# Patient Record
Sex: Male | Born: 1965 | Race: White | Hispanic: No | Marital: Married | Smoking: Never smoker
Health system: Southern US, Community
[De-identification: ages and names within clinical notes are randomized; demographics above are authoritative.]

## PROBLEM LIST (undated history)

## (undated) DIAGNOSIS — H539 Unspecified visual disturbance: Secondary | ICD-10-CM

## (undated) DIAGNOSIS — F32A Depression, unspecified: Secondary | ICD-10-CM

## (undated) DIAGNOSIS — E291 Testicular hypofunction: Secondary | ICD-10-CM

## (undated) DIAGNOSIS — M545 Low back pain, unspecified: Secondary | ICD-10-CM

## (undated) DIAGNOSIS — K760 Fatty (change of) liver, not elsewhere classified: Secondary | ICD-10-CM

## (undated) DIAGNOSIS — E7800 Pure hypercholesterolemia, unspecified: Secondary | ICD-10-CM

## (undated) DIAGNOSIS — R51 Headache: Secondary | ICD-10-CM

## (undated) DIAGNOSIS — I10 Essential (primary) hypertension: Secondary | ICD-10-CM

## (undated) DIAGNOSIS — R519 Headache, unspecified: Secondary | ICD-10-CM

## (undated) DIAGNOSIS — K589 Irritable bowel syndrome without diarrhea: Secondary | ICD-10-CM

## (undated) DIAGNOSIS — F329 Major depressive disorder, single episode, unspecified: Secondary | ICD-10-CM

## (undated) DIAGNOSIS — Z8701 Personal history of pneumonia (recurrent): Secondary | ICD-10-CM

## (undated) DIAGNOSIS — F419 Anxiety disorder, unspecified: Secondary | ICD-10-CM

## (undated) DIAGNOSIS — K5732 Diverticulitis of large intestine without perforation or abscess without bleeding: Principal | ICD-10-CM

## (undated) DIAGNOSIS — K5730 Diverticulosis of large intestine without perforation or abscess without bleeding: Secondary | ICD-10-CM

## (undated) DIAGNOSIS — E663 Overweight: Secondary | ICD-10-CM

## (undated) DIAGNOSIS — G894 Chronic pain syndrome: Secondary | ICD-10-CM

## (undated) DIAGNOSIS — B019 Varicella without complication: Secondary | ICD-10-CM

## (undated) HISTORY — DX: Diverticulosis of large intestine without perforation or abscess without bleeding: K57.30

## (undated) HISTORY — DX: Pure hypercholesterolemia, unspecified: E78.00

## (undated) HISTORY — DX: Chronic pain syndrome: G89.4

## (undated) HISTORY — DX: Essential (primary) hypertension: I10

## (undated) HISTORY — DX: Overweight: E66.3

## (undated) HISTORY — DX: Personal history of pneumonia (recurrent): Z87.01

## (undated) HISTORY — DX: Unspecified visual disturbance: H53.9

## (undated) HISTORY — DX: Fatty (change of) liver, not elsewhere classified: K76.0

## (undated) HISTORY — PX: WISDOM TOOTH EXTRACTION: SHX21

## (undated) HISTORY — DX: Varicella without complication: B01.9

## (undated) HISTORY — DX: Testicular hypofunction: E29.1

## (undated) HISTORY — DX: Headache: R51

## (undated) HISTORY — DX: Low back pain, unspecified: M54.50

## (undated) HISTORY — DX: Low back pain: M54.5

## (undated) HISTORY — DX: Headache, unspecified: R51.9

## (undated) HISTORY — DX: Irritable bowel syndrome, unspecified: K58.9

## (undated) HISTORY — DX: Major depressive disorder, single episode, unspecified: F32.9

## (undated) HISTORY — DX: Anxiety disorder, unspecified: F41.9

## (undated) HISTORY — DX: Depression, unspecified: F32.A

## (undated) HISTORY — DX: Diverticulitis of large intestine without perforation or abscess without bleeding: K57.32

---

## 2006-04-24 ENCOUNTER — Encounter: Admission: RE | Admit: 2006-04-24 | Discharge: 2006-04-24 | Payer: Self-pay | Admitting: Gastroenterology

## 2007-10-31 ENCOUNTER — Emergency Department (HOSPITAL_BASED_OUTPATIENT_CLINIC_OR_DEPARTMENT_OTHER): Admission: EM | Admit: 2007-10-31 | Discharge: 2007-10-31 | Payer: Self-pay | Admitting: Emergency Medicine

## 2009-02-26 ENCOUNTER — Emergency Department (HOSPITAL_BASED_OUTPATIENT_CLINIC_OR_DEPARTMENT_OTHER): Admission: EM | Admit: 2009-02-26 | Discharge: 2009-02-26 | Payer: Self-pay | Admitting: Emergency Medicine

## 2009-10-11 ENCOUNTER — Emergency Department (HOSPITAL_BASED_OUTPATIENT_CLINIC_OR_DEPARTMENT_OTHER): Admission: EM | Admit: 2009-10-11 | Discharge: 2009-10-11 | Payer: Self-pay | Admitting: Emergency Medicine

## 2009-10-11 ENCOUNTER — Ambulatory Visit: Payer: Self-pay | Admitting: Diagnostic Radiology

## 2009-10-13 ENCOUNTER — Emergency Department (HOSPITAL_BASED_OUTPATIENT_CLINIC_OR_DEPARTMENT_OTHER): Admission: EM | Admit: 2009-10-13 | Discharge: 2009-10-13 | Payer: Self-pay | Admitting: Emergency Medicine

## 2009-10-13 ENCOUNTER — Ambulatory Visit: Payer: Self-pay | Admitting: Diagnostic Radiology

## 2009-11-04 ENCOUNTER — Emergency Department (HOSPITAL_COMMUNITY): Admission: EM | Admit: 2009-11-04 | Discharge: 2009-11-05 | Payer: Self-pay | Admitting: Emergency Medicine

## 2010-01-01 HISTORY — PX: OTHER SURGICAL HISTORY: SHX169

## 2010-02-27 ENCOUNTER — Telehealth (INDEPENDENT_AMBULATORY_CARE_PROVIDER_SITE_OTHER): Payer: Self-pay | Admitting: *Deleted

## 2010-03-09 NOTE — Progress Notes (Signed)
Summary: [id number] x 1  Phone Note Call from Patient Call back at [phone]   Caller: Patient Call For: nadel Summary of Call: Pt has appt on 4/4 wants to know if he can be seen sooner than this pls advise. Initial call taken by: Darletta Moll,  February 27, 2010 3:36 PM  Follow-up for Phone Call        leigh pls advise   Philipp [facility]  February 27, 2010 4:46 PM   SN has no other aval times open for an earlier appt Randell Loop [facility]  February 27, 2010 4:58 PM    Called the other number that was given and msg states that this telephone number is not valid.  Had to [facility] at his home number. Vernie Murders  February 27, 2010 5:16 PM   Additional Follow-up for Phone Call Additional follow up Details #1::        Spoke with pt and advised no sooner openings with SN.  I asked if he was sick? Need to see TP? He stated that he is doing fine, just has not had a physical in a few yrs and is anxious.  I advised that he can call sooner and check and see if SN has ny cancellations. Additional Follow-up by: Vernie Murders,  February 28, 2010 5:30 PM

## 2010-03-14 LAB — CBC
HCT: 41.5 % (ref 39.0–52.0)
Hemoglobin: 14.1 g/dL (ref 13.0–17.0)
MCH: 30.4 pg (ref 26.0–34.0)
MCHC: 34 g/dL (ref 30.0–36.0)

## 2010-03-14 LAB — BASIC METABOLIC PANEL
CO2: 28 mEq/L (ref 19–32)
Chloride: 103 mEq/L (ref 96–112)
Glucose, Bld: 99 mg/dL (ref 70–99)
Potassium: 4.7 mEq/L (ref 3.5–5.1)
Sodium: 140 mEq/L (ref 135–145)

## 2010-03-14 LAB — DIFFERENTIAL
Basophils Relative: 0 % (ref 0–1)
Eosinophils Absolute: 0.2 10*3/uL (ref 0.0–0.7)
Monocytes Absolute: 0.6 10*3/uL (ref 0.1–1.0)
Monocytes Relative: 7 % (ref 3–12)

## 2010-03-14 LAB — D-DIMER, QUANTITATIVE: D-Dimer, Quant: <0.22 ug/mL-FEU (ref 0.00–0.48)

## 2010-03-14 LAB — CK TOTAL AND CKMB (NOT AT ARMC): CK, MB: 0.9 ng/mL (ref 0.3–4.0)

## 2010-03-16 LAB — CBC
Hemoglobin: 13.1 g/dL (ref 13.0–17.0)
MCH: 30.1 pg (ref 26.0–34.0)
RBC: 4.34 MIL/uL (ref 4.22–5.81)
WBC: 10.6 10*3/uL — ABNORMAL HIGH (ref 4.0–10.5)

## 2010-03-16 LAB — DIFFERENTIAL
Eosinophils Absolute: 0.3 10*3/uL (ref 0.0–0.7)
Lymphocytes Relative: 17 % (ref 12–46)
Lymphs Abs: 1.8 10*3/uL (ref 0.7–4.0)
Monocytes Relative: 7 % (ref 3–12)
Neutrophils Relative %: 72 % (ref 43–77)

## 2010-03-16 LAB — BASIC METABOLIC PANEL
CO2: 30 mEq/L (ref 19–32)
Calcium: 9.2 mg/dL (ref 8.4–10.5)
Chloride: 102 mEq/L (ref 96–112)
GFR calc Af Amer: >60 mL/min (ref >60)
Sodium: 141 mEq/L (ref 135–145)

## 2010-04-03 ENCOUNTER — Encounter: Payer: Self-pay | Admitting: *Deleted

## 2010-04-04 ENCOUNTER — Encounter: Payer: Self-pay | Admitting: Pulmonary Disease

## 2010-04-05 ENCOUNTER — Other Ambulatory Visit (INDEPENDENT_AMBULATORY_CARE_PROVIDER_SITE_OTHER): Payer: BC Managed Care – PPO | Admitting: Pulmonary Disease

## 2010-04-05 ENCOUNTER — Ambulatory Visit (INDEPENDENT_AMBULATORY_CARE_PROVIDER_SITE_OTHER): Payer: BC Managed Care – PPO | Admitting: Pulmonary Disease

## 2010-04-05 ENCOUNTER — Other Ambulatory Visit (INDEPENDENT_AMBULATORY_CARE_PROVIDER_SITE_OTHER): Payer: BC Managed Care – PPO

## 2010-04-05 ENCOUNTER — Ambulatory Visit (INDEPENDENT_AMBULATORY_CARE_PROVIDER_SITE_OTHER)
Admission: RE | Admit: 2010-04-05 | Discharge: 2010-04-05 | Disposition: A | Discharge status: Home / Self Care | Payer: BC Managed Care – PPO | Source: Ambulatory Visit | Attending: Pulmonary Disease | Admitting: Pulmonary Disease

## 2010-04-05 ENCOUNTER — Encounter: Payer: Self-pay | Admitting: Pulmonary Disease

## 2010-04-05 DIAGNOSIS — Z0000 Encounter for general adult medical examination without abnormal findings: Secondary | ICD-10-CM

## 2010-04-05 DIAGNOSIS — F32A Depression, unspecified: Secondary | ICD-10-CM

## 2010-04-05 DIAGNOSIS — K589 Irritable bowel syndrome without diarrhea: Secondary | ICD-10-CM

## 2010-04-05 DIAGNOSIS — R5381 Other malaise: Secondary | ICD-10-CM

## 2010-04-05 DIAGNOSIS — Z8701 Personal history of pneumonia (recurrent): Secondary | ICD-10-CM

## 2010-04-05 DIAGNOSIS — R0600 Dyspnea, unspecified: Secondary | ICD-10-CM

## 2010-04-05 DIAGNOSIS — F341 Dysthymic disorder: Secondary | ICD-10-CM

## 2010-04-05 DIAGNOSIS — M545 Low back pain, unspecified: Secondary | ICD-10-CM

## 2010-04-05 DIAGNOSIS — R5383 Other fatigue: Secondary | ICD-10-CM

## 2010-04-05 DIAGNOSIS — F419 Anxiety disorder, unspecified: Secondary | ICD-10-CM

## 2010-04-05 DIAGNOSIS — R531 Weakness: Secondary | ICD-10-CM

## 2010-04-05 DIAGNOSIS — E785 Hyperlipidemia, unspecified: Secondary | ICD-10-CM

## 2010-04-05 DIAGNOSIS — R0989 Other specified symptoms and signs involving the circulatory and respiratory systems: Secondary | ICD-10-CM

## 2010-04-05 DIAGNOSIS — R0609 Other forms of dyspnea: Secondary | ICD-10-CM

## 2010-04-05 LAB — TESTOSTERONE: Testosterone: 229.04 ng/dL — ABNORMAL LOW (ref 350.00–890.00)

## 2010-04-05 LAB — HEPATIC FUNCTION PANEL
ALT: 86 U/L — ABNORMAL HIGH (ref 0–53)
AST: 39 U/L — ABNORMAL HIGH (ref 0–37)
Bilirubin, Direct: 0.1 mg/dL (ref 0.0–0.3)
Total Bilirubin: 1.1 mg/dL (ref 0.3–1.2)

## 2010-04-05 LAB — LIPID PANEL
Cholesterol: 261 mg/dL — ABNORMAL HIGH (ref 0–200)
HDL: 43.7 mg/dL (ref >39.00)
Total CHOL/HDL Ratio: 6
Triglycerides: 243 mg/dL — ABNORMAL HIGH (ref 0.0–149.0)
VLDL: 48.6 mg/dL — ABNORMAL HIGH (ref 0.0–40.0)

## 2010-04-05 LAB — BASIC METABOLIC PANEL
BUN: 16 mg/dL (ref 6–23)
CO2: 29 mEq/L (ref 19–32)
Calcium: 9.4 mg/dL (ref 8.4–10.5)
Glucose, Bld: 85 mg/dL (ref 70–99)
Potassium: 4.6 mEq/L (ref 3.5–5.1)
Sodium: 143 mEq/L (ref 135–145)

## 2010-04-05 LAB — CBC WITH DIFFERENTIAL/PLATELET
Basophils Absolute: 0.1 10*3/uL (ref 0.0–0.1)
Eosinophils Relative: 0.6 % (ref 0.0–5.0)
Lymphocytes Relative: 19.3 % (ref 12.0–46.0)
Monocytes Relative: 7 % (ref 3.0–12.0)
Neutrophils Relative %: 72.7 % (ref 43.0–77.0)
Platelets: 461 10*3/uL — ABNORMAL HIGH (ref 150.0–400.0)
RDW: 14.8 % — ABNORMAL HIGH (ref 11.5–14.6)
WBC: 17.4 10*3/uL — ABNORMAL HIGH (ref 4.5–10.5)

## 2010-04-05 LAB — TSH: TSH: 0.99 u[IU]/mL (ref 0.35–5.50)
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
## 2010-04-05 NOTE — Patient Instructions (Addendum)
Kurt Mcdonald, it was nice meeting you today...    We established your med list w/ the 3 meds from DrSader> continue these the same for now...  Today we did an initial assessment including CXR, EKG, PFT, & fasting blood work...    We will call you w/ these results when they are all available & discuss any options for additional therapy at that time...  Be sure to start back on a gradual exercise program> walking, gym, at home, whatever...    Walking for the aerobic conditioning...    Back & Abdominal exercises for your spine...  At the same time start on a calorie restricted diet to aide in wt reduction...  Let's plan a follow up visit in 6-8 weeks to monitor your progress... We will attempt to get records from [facility] office in the interim.Marland KitchenMarland Kitchen

## 2010-04-11 ENCOUNTER — Encounter: Payer: Self-pay | Admitting: Pulmonary Disease

## 2010-04-11 DIAGNOSIS — M545 Low back pain, unspecified: Secondary | ICD-10-CM | POA: Insufficient documentation

## 2010-04-11 DIAGNOSIS — F329 Major depressive disorder, single episode, unspecified: Secondary | ICD-10-CM | POA: Insufficient documentation

## 2010-04-11 DIAGNOSIS — F32A Depression, unspecified: Secondary | ICD-10-CM | POA: Insufficient documentation

## 2010-04-11 DIAGNOSIS — Z8701 Personal history of pneumonia (recurrent): Secondary | ICD-10-CM | POA: Insufficient documentation

## 2010-04-11 NOTE — Assessment & Plan Note (Signed)
He was quite concerned about these two bouts of pneumonia around the time of his collar bone surgeries... We don't have records but eval today reveals totally normal CXR, PFT, and baseline EKG (x sl sinus tach)... He is reassured about his current cardiopulm status.Marland KitchenMarland Kitchen

## 2010-04-11 NOTE — Assessment & Plan Note (Signed)
Followed by DrSader in [place] on Cymbalta & Hydrocodone 10mg  Bid;  We will request records & review when avail;  Pt cautioned about the narcotic pain meds.Marland KitchenMarland Kitchen

## 2010-04-11 NOTE — Assessment & Plan Note (Signed)
He has classic IBS symptoms & we discussed this diagnosis Advised bulk agents, Metamucil etc... We will try to get records from DrMcFadder.Marland KitchenMarland Kitchen

## 2010-04-11 NOTE — Assessment & Plan Note (Signed)
I suspect mostly related to his stress & depression... Plus deconditioning & we reviewed diet to lose wt & exercise to incr his sense of well being etc... We requested full labs including Testos level to r/o Low-T, but he forgot to go to the lab & we will call him to come back at his convenience.Marland KitchenMarland Kitchen

## 2010-04-11 NOTE — Assessment & Plan Note (Signed)
He has signif problem w/ his nerves & depression due to family situation, business, etc... Already on Cymbalta 60mg /d & Klonopin from DrSader in [facility] (Neurology) & we will ask for records... Continue his meds for now.Marland KitchenMarland Kitchen

## 2010-04-17 ENCOUNTER — Other Ambulatory Visit: Payer: Self-pay | Admitting: Pulmonary Disease

## 2010-04-17 DIAGNOSIS — R7989 Other specified abnormal findings of blood chemistry: Secondary | ICD-10-CM

## 2010-04-19 ENCOUNTER — Ambulatory Visit
Admission: RE | Admit: 2010-04-19 | Discharge: 2010-04-19 | Disposition: A | Discharge status: Home / Self Care | Payer: BC Managed Care – PPO | Source: Ambulatory Visit | Attending: Pulmonary Disease | Admitting: Pulmonary Disease

## 2010-04-19 DIAGNOSIS — R7989 Other specified abnormal findings of blood chemistry: Secondary | ICD-10-CM

## 2010-04-21 ENCOUNTER — Telehealth: Payer: Self-pay | Admitting: Pulmonary Disease

## 2010-04-21 NOTE — Telephone Encounter (Signed)
Forwarded to Dr. Nadel for review. °

## 2010-05-04 ENCOUNTER — Telehealth: Payer: Self-pay | Admitting: Pulmonary Disease

## 2010-05-04 NOTE — Telephone Encounter (Signed)
LMTCB

## 2010-05-05 MED ORDER — TESTOSTERONE 12.5 MG/ACT (1%) TD GEL: 4.0000 "application " | TRANSDERMAL | Status: DC

## 2010-05-05 NOTE — Telephone Encounter (Signed)
[facility] and advised andro gel pump 4 pumps applied to skin every day as directed #1 month supply w/ prn refills. Pt is also aware rx was called into pharmacy. Spoke w/ Adela Glimpse at [facility] [place] and is aware of rx. Pt also aware of [place] of abd results and verbalized understanding his need to exercise and get his weight down

## 2010-05-05 NOTE — Telephone Encounter (Signed)
Per SN--good idea to start this--andro gel pump   4 pumps applied to skin every day as directed.   # 1 month supply with prn refills.--also please let pt know that his [place] of abd showed fatty liver disease and recs for this is exercise and get his weight down.  thanks

## 2010-05-05 NOTE — Telephone Encounter (Signed)
Spoke with pt.  He is c/o extreme fatigue and "trouble getting through the day"- feels this is due to low T. He states needs sooner appt with SN.  He states that he wants sooner appt with SN.  I advised SN has no openings for the rest of the month.  I offered appt with TP and he refused.  He states that he had discussed with SN at last visit starting on med for low T and would like to just go ahead and have something called in.  Will forward to SN.  Pls advise thanks! [facility] [place] [place]

## 2010-05-12 ENCOUNTER — Telehealth: Payer: Self-pay | Admitting: Pulmonary Disease

## 2010-05-12 NOTE — Telephone Encounter (Signed)
Pa signed by SN and has been faxed back to the pharmacy

## 2010-05-12 NOTE — Telephone Encounter (Signed)
Called [phone] to initiate PA. Case # [id number]. Received fax from [facility] and was placed on SN cart for fill out and sign. Please advise Dr. Kriste Basque. Thanks  Carver Fila, CMA

## 2010-05-18 ENCOUNTER — Telehealth: Payer: Self-pay | Admitting: *Deleted

## 2010-05-18 NOTE — Telephone Encounter (Signed)
Androgel APPROVED from 05/12/2010 to 02/04/2013.  [facility] [address] [facility].

## 2010-05-25 ENCOUNTER — Encounter: Payer: Self-pay | Admitting: Pulmonary Disease

## 2010-06-05 ENCOUNTER — Ambulatory Visit (INDEPENDENT_AMBULATORY_CARE_PROVIDER_SITE_OTHER): Payer: BC Managed Care – PPO | Admitting: Pulmonary Disease

## 2010-06-05 ENCOUNTER — Encounter: Payer: Self-pay | Admitting: Pulmonary Disease

## 2010-06-05 DIAGNOSIS — R0600 Dyspnea, unspecified: Secondary | ICD-10-CM

## 2010-06-05 DIAGNOSIS — R0989 Other specified symptoms and signs involving the circulatory and respiratory systems: Secondary | ICD-10-CM

## 2010-06-05 DIAGNOSIS — E7800 Pure hypercholesterolemia, unspecified: Secondary | ICD-10-CM

## 2010-06-05 DIAGNOSIS — Z8701 Personal history of pneumonia (recurrent): Secondary | ICD-10-CM

## 2010-06-05 DIAGNOSIS — F329 Major depressive disorder, single episode, unspecified: Secondary | ICD-10-CM

## 2010-06-05 DIAGNOSIS — K5730 Diverticulosis of large intestine without perforation or abscess without bleeding: Secondary | ICD-10-CM

## 2010-06-05 DIAGNOSIS — F32A Depression, unspecified: Secondary | ICD-10-CM

## 2010-06-05 DIAGNOSIS — K7689 Other specified diseases of liver: Secondary | ICD-10-CM

## 2010-06-05 DIAGNOSIS — K5790 Diverticulosis of intestine, part unspecified, without perforation or abscess without bleeding: Secondary | ICD-10-CM

## 2010-06-05 DIAGNOSIS — K589 Irritable bowel syndrome without diarrhea: Secondary | ICD-10-CM

## 2010-06-05 DIAGNOSIS — M545 Low back pain, unspecified: Secondary | ICD-10-CM

## 2010-06-05 DIAGNOSIS — F419 Anxiety disorder, unspecified: Secondary | ICD-10-CM

## 2010-06-05 DIAGNOSIS — R0609 Other forms of dyspnea: Secondary | ICD-10-CM

## 2010-06-05 DIAGNOSIS — E291 Testicular hypofunction: Secondary | ICD-10-CM

## 2010-06-05 DIAGNOSIS — E663 Overweight: Secondary | ICD-10-CM

## 2010-06-05 DIAGNOSIS — K760 Fatty (change of) liver, not elsewhere classified: Secondary | ICD-10-CM

## 2010-06-05 MED ORDER — ROSUVASTATIN CALCIUM 10 MG PO TABS: 10.0000 mg | ORAL_TABLET | Freq: Every day | ORAL | Status: DC
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
## 2010-06-16 DIAGNOSIS — E7800 Pure hypercholesterolemia, unspecified: Secondary | ICD-10-CM | POA: Insufficient documentation

## 2010-06-16 DIAGNOSIS — E663 Overweight: Secondary | ICD-10-CM | POA: Insufficient documentation

## 2010-06-16 DIAGNOSIS — E291 Testicular hypofunction: Secondary | ICD-10-CM | POA: Insufficient documentation

## 2010-06-16 DIAGNOSIS — K760 Fatty (change of) liver, not elsewhere classified: Secondary | ICD-10-CM | POA: Insufficient documentation

## 2010-06-16 DIAGNOSIS — R0600 Dyspnea, unspecified: Secondary | ICD-10-CM | POA: Insufficient documentation

## 2010-06-16 DIAGNOSIS — K5790 Diverticulosis of intestine, part unspecified, without perforation or abscess without bleeding: Secondary | ICD-10-CM | POA: Insufficient documentation

## 2010-06-16 DIAGNOSIS — K589 Irritable bowel syndrome without diarrhea: Secondary | ICD-10-CM | POA: Insufficient documentation

## 2010-06-16 NOTE — Patient Instructions (Signed)
We updated your med list in EPIC...    Continue your current meds the same...  Good luck w/ the new job!  Call for any problems...  Let's plan a follow up eval in 3-4 months.Marland KitchenMarland Kitchen

## 2010-09-05 ENCOUNTER — Ambulatory Visit: Payer: BC Managed Care – PPO | Admitting: Pulmonary Disease

## 2010-09-20 ENCOUNTER — Ambulatory Visit: Payer: BC Managed Care – PPO | Admitting: Pulmonary Disease

## 2011-01-29 ENCOUNTER — Ambulatory Visit: Payer: BC Managed Care – PPO | Admitting: Pulmonary Disease

## 2011-02-22 ENCOUNTER — Encounter: Payer: Self-pay | Admitting: Pulmonary Disease

## 2011-02-22 ENCOUNTER — Ambulatory Visit (INDEPENDENT_AMBULATORY_CARE_PROVIDER_SITE_OTHER): Payer: BC Managed Care – PPO | Admitting: Pulmonary Disease

## 2011-02-22 DIAGNOSIS — E291 Testicular hypofunction: Secondary | ICD-10-CM

## 2011-02-22 DIAGNOSIS — R0600 Dyspnea, unspecified: Secondary | ICD-10-CM

## 2011-02-22 DIAGNOSIS — F341 Dysthymic disorder: Secondary | ICD-10-CM

## 2011-02-22 DIAGNOSIS — F419 Anxiety disorder, unspecified: Secondary | ICD-10-CM

## 2011-02-22 DIAGNOSIS — F32A Depression, unspecified: Secondary | ICD-10-CM

## 2011-02-22 DIAGNOSIS — K7689 Other specified diseases of liver: Secondary | ICD-10-CM

## 2011-02-22 DIAGNOSIS — R0609 Other forms of dyspnea: Secondary | ICD-10-CM

## 2011-02-22 DIAGNOSIS — R0989 Other specified symptoms and signs involving the circulatory and respiratory systems: Secondary | ICD-10-CM

## 2011-02-22 DIAGNOSIS — K589 Irritable bowel syndrome without diarrhea: Secondary | ICD-10-CM

## 2011-02-22 DIAGNOSIS — E7800 Pure hypercholesterolemia, unspecified: Secondary | ICD-10-CM

## 2011-02-22 DIAGNOSIS — M545 Low back pain, unspecified: Secondary | ICD-10-CM

## 2011-02-22 DIAGNOSIS — E663 Overweight: Secondary | ICD-10-CM

## 2011-02-22 DIAGNOSIS — K760 Fatty (change of) liver, not elsewhere classified: Secondary | ICD-10-CM

## 2011-02-22 MED ORDER — CYCLOBENZAPRINE HCL 10 MG PO TABS: 10.0000 mg | ORAL_TABLET | Freq: Three times a day (TID) | ORAL | Status: DC | PRN

## 2011-02-22 MED ORDER — CLONAZEPAM 1 MG PO TABS: 1.0000 mg | ORAL_TABLET | Freq: Two times a day (BID) | ORAL | Status: DC | PRN

## 2011-02-22 MED ORDER — HYDROCODONE-ACETAMINOPHEN 10-325 MG PO TABS: 1.0000 | ORAL_TABLET | Freq: Two times a day (BID) | ORAL | Status: DC | PRN

## 2011-02-22 MED ORDER — DULOXETINE HCL 60 MG PO CPEP: 60.0000 mg | ORAL_CAPSULE | Freq: Every day | ORAL | Status: DC
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
## 2011-02-22 NOTE — Patient Instructions (Signed)
Today we updated your med list in our EPIC system...    Continue your current medications the same...    We refilled your meds per request...  We decided to refer you to our LIPID Clinic for help w/ your Cholesterol...    We will arrange for an appt & let you know...  Don't forget to request a copy of your insurance company Drug Formulary to check for lower tier med alternatives...  Keep up the low cholesterol, low fat diet & work on weight reduction...  Call for any questions...  Let's plan a follow up visit in 6 months w/ FASTING blood work around that time.Marland KitchenMarland Kitchen

## 2011-02-26 ENCOUNTER — Ambulatory Visit: Payer: BC Managed Care – PPO

## 2011-08-22 ENCOUNTER — Ambulatory Visit: Payer: BC Managed Care – PPO | Admitting: Pulmonary Disease

## 2011-09-10 ENCOUNTER — Telehealth: Payer: Self-pay | Admitting: Pulmonary Disease

## 2011-09-10 NOTE — Telephone Encounter (Signed)
Per pharmacist, pt needs refills on both clonazepam and hydrocodone/APAP. Last rxgfiven in Feb 23013 with 5 additional refills. After looking again the pharacist says the pt has requested refills from Dr. Kriste Basque and his neurologist. Pharmacist will call the pt first and call back if still needing to have this filled by Dr. Kriste Basque.

## 2011-10-12 ENCOUNTER — Ambulatory Visit: Payer: BC Managed Care – PPO | Admitting: Pulmonary Disease

## 2011-11-02 HISTORY — PX: TOOTH EXTRACTION: SUR596

## 2011-11-13 ENCOUNTER — Encounter: Payer: Self-pay | Admitting: Pulmonary Disease

## 2011-11-13 ENCOUNTER — Ambulatory Visit (INDEPENDENT_AMBULATORY_CARE_PROVIDER_SITE_OTHER): Payer: Managed Care, Other (non HMO) | Admitting: Pulmonary Disease

## 2011-11-13 VITALS — BP 132/88 | HR 100 | Temp 97.8°F | Ht 76.0 in | Wt 266.0 lb

## 2011-11-13 DIAGNOSIS — F341 Dysthymic disorder: Secondary | ICD-10-CM

## 2011-11-13 DIAGNOSIS — M545 Low back pain, unspecified: Secondary | ICD-10-CM

## 2011-11-13 DIAGNOSIS — K7689 Other specified diseases of liver: Secondary | ICD-10-CM

## 2011-11-13 DIAGNOSIS — K589 Irritable bowel syndrome without diarrhea: Secondary | ICD-10-CM

## 2011-11-13 DIAGNOSIS — F419 Anxiety disorder, unspecified: Secondary | ICD-10-CM

## 2011-11-13 DIAGNOSIS — K760 Fatty (change of) liver, not elsewhere classified: Secondary | ICD-10-CM

## 2011-11-13 DIAGNOSIS — E663 Overweight: Secondary | ICD-10-CM

## 2011-11-13 DIAGNOSIS — F32A Depression, unspecified: Secondary | ICD-10-CM

## 2011-11-13 DIAGNOSIS — E7800 Pure hypercholesterolemia, unspecified: Secondary | ICD-10-CM

## 2011-11-13 DIAGNOSIS — K5730 Diverticulosis of large intestine without perforation or abscess without bleeding: Secondary | ICD-10-CM

## 2011-11-13 DIAGNOSIS — E291 Testicular hypofunction: Secondary | ICD-10-CM

## 2011-11-13 DIAGNOSIS — K5790 Diverticulosis of intestine, part unspecified, without perforation or abscess without bleeding: Secondary | ICD-10-CM

## 2011-11-13 MED ORDER — CLONAZEPAM 1 MG PO TABS: 1.0000 mg | ORAL_TABLET | Freq: Two times a day (BID) | ORAL | Status: DC | PRN

## 2011-11-13 MED ORDER — SIMVASTATIN 40 MG PO TABS: ORAL_TABLET | ORAL | Status: DC
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
## 2011-11-13 NOTE — Patient Instructions (Addendum)
Today we updated your med list in our EPIC system...    We decided to refill the KLONOPIN 1mg - take 1/2 to 1 tab up to twice daily as needed...  We also decided to try SIMVASTATIN 40mg - for your cholesterol; start w/ 1/2 at at night & increase to one tab if tolerated well...  Let's plan a FASTING lipid profile in about 3 months as discussed.Marland KitchenMarland Kitchen

## 2011-11-19 ENCOUNTER — Telehealth: Payer: Self-pay | Admitting: Pulmonary Disease

## 2011-11-19 MED ORDER — DULOXETINE HCL 30 MG PO CPEP: ORAL_CAPSULE | ORAL | Status: DC

## 2011-11-19 NOTE — Telephone Encounter (Signed)
Patient states he stopped taking Cymbalta 60mg --SN aware of this.  Patient requesting to be put back on the Cymbalta due to being under a great deal of stress recently.   Dr Kriste Basque please advise. Thanks.

## 2011-11-19 NOTE — Telephone Encounter (Signed)
I spoke with pt and is aware of SN recs and directions. rx has been sent to the pharmacy.

## 2011-11-19 NOTE — Telephone Encounter (Signed)
Per SN----start on cymbalta 30 mg   #60  1 po qhs x 1 month then increase to 2 qhs thereafter.  thanks

## 2011-11-21 ENCOUNTER — Telehealth: Payer: Self-pay | Admitting: Pulmonary Disease

## 2011-11-21 NOTE — Telephone Encounter (Signed)
Member # [id number]. Cymbalta has been APPROVED from 11/21/11 through 11/20/12. Pharmacy notified.

## 2012-02-04 ENCOUNTER — Telehealth: Payer: Self-pay | Admitting: Pulmonary Disease

## 2012-02-04 NOTE — Telephone Encounter (Signed)
Attempted to call patient 3x to schedule a follow up appointment. No return call back. Sent letter 02/04/12 °

## 2012-06-25 ENCOUNTER — Other Ambulatory Visit: Payer: Self-pay | Admitting: Pulmonary Disease

## 2012-06-30 ENCOUNTER — Telehealth: Payer: Self-pay | Admitting: Pulmonary Disease

## 2012-06-30 NOTE — Telephone Encounter (Signed)
I spoke with pt and he is requesting an appt with SN. He c/o on and off vomiting, sharp pains in back, stomach pains, and just "stomach issues" x 4 months per pt (getting worse). He stated he is just worried it may be his gallbladder. Please advise SN thanks  Last OV 11/13/12 Pending OV--no pending appt.   Allergies  Allergen Reactions  . Crestor (Rosuvastatin Calcium)     Severe leg cramping  . Sulfa Antibiotics     rash

## 2012-06-30 NOTE — Telephone Encounter (Signed)
Pt aware and appt made. Nothing further was needed

## 2012-06-30 NOTE — Telephone Encounter (Signed)
Per SN--  Ok to add pt to scheduled at 4 pm on 7-1.  thanks

## 2012-07-01 ENCOUNTER — Other Ambulatory Visit (INDEPENDENT_AMBULATORY_CARE_PROVIDER_SITE_OTHER): Payer: Managed Care, Other (non HMO)

## 2012-07-01 ENCOUNTER — Encounter: Payer: Self-pay | Admitting: Pulmonary Disease

## 2012-07-01 ENCOUNTER — Ambulatory Visit (INDEPENDENT_AMBULATORY_CARE_PROVIDER_SITE_OTHER): Payer: Managed Care, Other (non HMO) | Admitting: Pulmonary Disease

## 2012-07-01 VITALS — BP 120/88 | HR 77 | Temp 97.6°F | Ht 76.0 in | Wt 246.6 lb

## 2012-07-01 DIAGNOSIS — R112 Nausea with vomiting, unspecified: Secondary | ICD-10-CM

## 2012-07-01 DIAGNOSIS — F329 Major depressive disorder, single episode, unspecified: Secondary | ICD-10-CM

## 2012-07-01 DIAGNOSIS — M545 Low back pain, unspecified: Secondary | ICD-10-CM

## 2012-07-01 DIAGNOSIS — E7800 Pure hypercholesterolemia, unspecified: Secondary | ICD-10-CM

## 2012-07-01 DIAGNOSIS — F341 Dysthymic disorder: Secondary | ICD-10-CM

## 2012-07-01 DIAGNOSIS — E663 Overweight: Secondary | ICD-10-CM

## 2012-07-01 DIAGNOSIS — F32A Depression, unspecified: Secondary | ICD-10-CM

## 2012-07-01 DIAGNOSIS — R109 Unspecified abdominal pain: Secondary | ICD-10-CM | POA: Insufficient documentation

## 2012-07-01 DIAGNOSIS — E291 Testicular hypofunction: Secondary | ICD-10-CM

## 2012-07-01 DIAGNOSIS — K7689 Other specified diseases of liver: Secondary | ICD-10-CM

## 2012-07-01 DIAGNOSIS — K760 Fatty (change of) liver, not elsewhere classified: Secondary | ICD-10-CM

## 2012-07-01 DIAGNOSIS — K589 Irritable bowel syndrome without diarrhea: Secondary | ICD-10-CM

## 2012-07-01 LAB — BASIC METABOLIC PANEL
BUN: 18 mg/dL (ref 6–23)
Calcium: 9.7 mg/dL (ref 8.4–10.5)
GFR: 75.46 mL/min (ref >60.00)
Potassium: 4.4 mEq/L (ref 3.5–5.1)
Sodium: 137 mEq/L (ref 135–145)

## 2012-07-01 LAB — CBC WITH DIFFERENTIAL/PLATELET
Basophils Relative: 0.5 % (ref 0.0–3.0)
Eosinophils Relative: 0.6 % (ref 0.0–5.0)
HCT: 45.7 % (ref 39.0–52.0)
Hemoglobin: 15.7 g/dL (ref 13.0–17.0)
Lymphs Abs: 3.1 10*3/uL (ref 0.7–4.0)
MCV: 90.5 fl (ref 78.0–100.0)
Monocytes Absolute: 1.1 10*3/uL — ABNORMAL HIGH (ref 0.1–1.0)
Monocytes Relative: 7.5 % (ref 3.0–12.0)
Neutro Abs: 9.9 10*3/uL — ABNORMAL HIGH (ref 1.4–7.7)
Platelets: 424 10*3/uL — ABNORMAL HIGH (ref 150.0–400.0)
WBC: 14.3 10*3/uL — ABNORMAL HIGH (ref 4.5–10.5)

## 2012-07-01 LAB — TSH: TSH: 1.56 u[IU]/mL (ref 0.35–5.50)

## 2012-07-01 LAB — HEPATIC FUNCTION PANEL
AST: 18 U/L (ref 0–37)
Total Bilirubin: 0.9 mg/dL (ref 0.3–1.2)

## 2012-07-01 MED ORDER — ZOFRAN 8 MG PO TABS: 8.0000 mg | ORAL_TABLET | Freq: Four times a day (QID) | ORAL | Status: DC | PRN

## 2012-07-01 MED ORDER — DICYCLOMINE HCL 20 MG PO TABS: 20.0000 mg | ORAL_TABLET | Freq: Four times a day (QID) | ORAL | Status: DC | PRN

## 2012-07-01 NOTE — Patient Instructions (Addendum)
Today we updated your med list in our EPIC system...    Continue your current medications the same...  We wrote new prescriptions for BENTYL (Dicyclomine) 20mg - one tab up to 4 times daily as needed for abd cramping pain...    We also wrote for [facility] 8mg - one tab up to 4 times daily as needed for nausea...  Remember to try the METAMUCIL daily as a stool "normalizer" by varying the amt of water that you take it with...  Today we did some follow up blood work to check for infection & inflammation in addition to the liver enzymes...  We will sched a CT scan of your Abd 7 Pelvis ASAP to give [place] a clue as to any structural abnormality in the abdomen...  Finally we will arrange for a GI evaluation by one of our gastroenterologists as I suspect they may need to check out your upper & lower GI tracks w their scopes...  Call for any questions.Marland KitchenMarland Kitchen
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
## 2012-07-02 ENCOUNTER — Encounter: Payer: Self-pay | Admitting: Gastroenterology

## 2012-07-02 ENCOUNTER — Encounter: Payer: Self-pay | Admitting: Pulmonary Disease

## 2012-07-03 ENCOUNTER — Ambulatory Visit (INDEPENDENT_AMBULATORY_CARE_PROVIDER_SITE_OTHER)
Admission: RE | Admit: 2012-07-03 | Discharge: 2012-07-03 | Disposition: A | Discharge status: Home / Self Care | Payer: Managed Care, Other (non HMO) | Source: Ambulatory Visit | Attending: Pulmonary Disease | Admitting: Pulmonary Disease

## 2012-07-03 ENCOUNTER — Other Ambulatory Visit: Payer: Self-pay | Admitting: Pulmonary Disease

## 2012-07-03 DIAGNOSIS — R112 Nausea with vomiting, unspecified: Secondary | ICD-10-CM

## 2012-07-03 DIAGNOSIS — R109 Unspecified abdominal pain: Secondary | ICD-10-CM

## 2012-07-03 MED ORDER — IOHEXOL 300 MG/ML  SOLN
100.0000 mL | Freq: Once | INTRAMUSCULAR | Status: AC | PRN
  Administered 2012-07-03: 100 mL via INTRAVENOUS

## 2012-07-03 MED ORDER — HYOSCYAMINE SULFATE 0.125 MG PO TABS: 0.1250 mg | ORAL_TABLET | Freq: Four times a day (QID) | ORAL | Status: DC | PRN

## 2012-07-03 NOTE — Telephone Encounter (Signed)
Pt is aware of medication called to the pharmacy and they will get the zofran filled for him.

## 2012-07-08 ENCOUNTER — Telehealth: Payer: Self-pay | Admitting: Pulmonary Disease

## 2012-07-08 NOTE — Telephone Encounter (Signed)
Pt was seen by SN on 07/01/12 for stomach issues.   Called, spoke with pt.  Reports he is still having "really bad cramps and stomach issues."  States he has some diarrhea but no vomiting.  Is taking zofran and levsin with relief from both.  Bentyl isn't helping.  Pt states his WBC was elevated and reports SN mentioned maybe doing an abx.  Pt would like further recs.  SN, pls advise.  Thank you.

## 2012-07-08 NOTE — Telephone Encounter (Signed)
Patient returning call.

## 2012-07-08 NOTE — Telephone Encounter (Signed)
lmomtcb x1 for pt 

## 2012-07-08 NOTE — Telephone Encounter (Signed)
Per SN---  Pt will need to keep his appt with GI on Friday.  lmom for pt to call back.

## 2012-07-08 NOTE — Telephone Encounter (Signed)
Returning call.Kurt Mcdonald ° °

## 2012-07-08 NOTE — Telephone Encounter (Signed)
Spoke with the pt and notified of recs per SN Pt verbalized understanding and states nothing further needed

## 2012-07-11 ENCOUNTER — Ambulatory Visit: Payer: Managed Care, Other (non HMO) | Admitting: Gastroenterology

## 2012-07-11 ENCOUNTER — Encounter: Payer: Self-pay | Admitting: Gastroenterology

## 2012-07-14 ENCOUNTER — Telehealth: Payer: Self-pay | Admitting: Pulmonary Disease

## 2012-07-14 ENCOUNTER — Encounter (HOSPITAL_COMMUNITY): Payer: Self-pay | Admitting: *Deleted

## 2012-07-14 ENCOUNTER — Emergency Department (HOSPITAL_COMMUNITY)
Admission: EM | Admit: 2012-07-14 | Discharge: 2012-07-14 | Disposition: A | Discharge status: Home / Self Care | Payer: Managed Care, Other (non HMO) | Attending: Emergency Medicine | Admitting: Emergency Medicine

## 2012-07-14 DIAGNOSIS — E7800 Pure hypercholesterolemia, unspecified: Secondary | ICD-10-CM | POA: Insufficient documentation

## 2012-07-14 DIAGNOSIS — Z87448 Personal history of other diseases of urinary system: Secondary | ICD-10-CM | POA: Insufficient documentation

## 2012-07-14 DIAGNOSIS — K589 Irritable bowel syndrome without diarrhea: Secondary | ICD-10-CM | POA: Insufficient documentation

## 2012-07-14 DIAGNOSIS — F3289 Other specified depressive episodes: Secondary | ICD-10-CM | POA: Insufficient documentation

## 2012-07-14 DIAGNOSIS — R112 Nausea with vomiting, unspecified: Secondary | ICD-10-CM | POA: Insufficient documentation

## 2012-07-14 DIAGNOSIS — K7689 Other specified diseases of liver: Secondary | ICD-10-CM | POA: Insufficient documentation

## 2012-07-14 DIAGNOSIS — M549 Dorsalgia, unspecified: Secondary | ICD-10-CM | POA: Insufficient documentation

## 2012-07-14 DIAGNOSIS — Z8701 Personal history of pneumonia (recurrent): Secondary | ICD-10-CM | POA: Insufficient documentation

## 2012-07-14 DIAGNOSIS — Z79899 Other long term (current) drug therapy: Secondary | ICD-10-CM | POA: Insufficient documentation

## 2012-07-14 DIAGNOSIS — G894 Chronic pain syndrome: Secondary | ICD-10-CM | POA: Insufficient documentation

## 2012-07-14 DIAGNOSIS — F411 Generalized anxiety disorder: Secondary | ICD-10-CM | POA: Insufficient documentation

## 2012-07-14 DIAGNOSIS — E663 Overweight: Secondary | ICD-10-CM | POA: Insufficient documentation

## 2012-07-14 DIAGNOSIS — Z8719 Personal history of other diseases of the digestive system: Secondary | ICD-10-CM | POA: Insufficient documentation

## 2012-07-14 DIAGNOSIS — R109 Unspecified abdominal pain: Secondary | ICD-10-CM | POA: Diagnosis present

## 2012-07-14 DIAGNOSIS — Z8709 Personal history of other diseases of the respiratory system: Secondary | ICD-10-CM | POA: Insufficient documentation

## 2012-07-14 DIAGNOSIS — F329 Major depressive disorder, single episode, unspecified: Secondary | ICD-10-CM | POA: Insufficient documentation

## 2012-07-14 LAB — CBC WITH DIFFERENTIAL/PLATELET
Basophils Absolute: 0 K/uL (ref 0.0–0.1)
Basophils Relative: 0 % (ref 0–1)
Eosinophils Absolute: 0.1 K/uL (ref 0.0–0.7)
Eosinophils Relative: 1 % (ref 0–5)
HCT: 46 % (ref 39.0–52.0)
Hemoglobin: 16.3 g/dL (ref 13.0–17.0)
Lymphocytes Relative: 20 % (ref 12–46)
Lymphs Abs: 1.8 K/uL (ref 0.7–4.0)
MCH: 31.2 pg (ref 26.0–34.0)
MCHC: 35.4 g/dL (ref 30.0–36.0)
MCV: 88.1 fL (ref 78.0–100.0)
Monocytes Absolute: 0.6 K/uL (ref 0.1–1.0)
Monocytes Relative: 7 % (ref 3–12)
Neutro Abs: 6.6 K/uL (ref 1.7–7.7)
Neutrophils Relative %: 72 % (ref 43–77)
Platelets: 346 K/uL (ref 150–400)
RBC: 5.22 MIL/uL (ref 4.22–5.81)
RDW: 13.2 % (ref 11.5–15.5)
WBC: 9.2 K/uL (ref 4.0–10.5)

## 2012-07-14 LAB — CG4 I-STAT (LACTIC ACID): Lactic Acid, Venous: 1.13 mmol/L (ref 0.5–2.2)

## 2012-07-14 LAB — LIPASE, BLOOD: Lipase: 17 U/L (ref 11–59)

## 2012-07-14 LAB — COMPREHENSIVE METABOLIC PANEL WITH GFR
ALT: 28 U/L (ref 0–53)
AST: 24 U/L (ref 0–37)
Albumin: 3.9 g/dL (ref 3.5–5.2)
Alkaline Phosphatase: 72 U/L (ref 39–117)
BUN: 12 mg/dL (ref 6–23)
CO2: 27 meq/L (ref 19–32)
Calcium: 9.3 mg/dL (ref 8.4–10.5)
Chloride: 102 meq/L (ref 96–112)
Creatinine, Ser: 1.47 mg/dL — ABNORMAL HIGH (ref 0.50–1.35)
GFR calc Af Amer: 64 mL/min — ABNORMAL LOW
GFR calc non Af Amer: 55 mL/min — ABNORMAL LOW
Glucose, Bld: 110 mg/dL — ABNORMAL HIGH (ref 70–99)
Potassium: 4.6 meq/L (ref 3.5–5.1)
Sodium: 139 meq/L (ref 135–145)
Total Bilirubin: 0.8 mg/dL (ref 0.3–1.2)
Total Protein: 7.3 g/dL (ref 6.0–8.3)

## 2012-07-14 MED ORDER — HYDROMORPHONE HCL PF 1 MG/ML IJ SOLN
1.0000 mg | Freq: Once | INTRAMUSCULAR | Status: AC
  Administered 2012-07-14: 1 mg via INTRAVENOUS
  Filled 2012-07-14: qty 1

## 2012-07-14 MED ORDER — ONDANSETRON HCL 4 MG/2ML IJ SOLN
4.0000 mg | Freq: Once | INTRAMUSCULAR | Status: AC
  Administered 2012-07-14: 4 mg via INTRAVENOUS
  Filled 2012-07-14: qty 2

## 2012-07-14 MED ORDER — SODIUM CHLORIDE 0.9 % IV BOLUS (SEPSIS)
1000.0000 mL | Freq: Once | INTRAVENOUS | Status: AC
  Administered 2012-07-14: 1000 mL via INTRAVENOUS

## 2012-07-14 NOTE — ED Notes (Signed)
Pt has been having intermittent lower abdominal pain for the last four months and was diagnosed with diverticulosis.  Pt started having severe lower abdominal pain and cramping with vomiting

## 2012-07-14 NOTE — ED Provider Notes (Signed)
History    CSN: [id number] Arrival date & time 07/14/12  1517  First MD Initiated Contact with Patient 07/14/12 1534     Chief Complaint  Patient presents with  . Abdominal Pain  . Back Pain   (Consider location/radiation/quality/duration/timing/severity/associated sxs/prior Treatment) Patient is a 47 y.o. male presenting with abdominal pain. The history is provided by the patient.  Abdominal Pain This is a recurrent (acutely worsened 4-5 days ago) problem. The current episode started more than 1 month ago (started 3 months ago). The problem occurs constantly. The problem has been gradually worsening. Associated symptoms include abdominal pain, anorexia (decreased due to abdominal pain), a change in bowel habit (+ IBS, unchanged from baseline), nausea and vomiting (x1/day for 5 days, nonbilious nonbloody). Pertinent negatives include no arthralgias, chest pain, chills, congestion, coughing, diaphoresis, fatigue, fever, headaches, joint swelling, myalgias, neck pain, numbness, rash, sore throat, urinary symptoms, vertigo, visual change or weakness. The symptoms are aggravated by eating (30 minutes after). He has tried NSAIDs, lying down, oral narcotics and position changes for the symptoms. The treatment provided no relief.   Past Medical History  Diagnosis Date  . Dyspnea   . History of pneumonia   . Hypercholesteremia   . Overweight(278.02)   . Diverticulosis of colon   . Irritable bowel syndrome (IBS)   . NAFLD (nonalcoholic fatty liver disease)   . Male hypogonadism   . LBP (low back pain)   . Chronic pain syndrome   . Anxiety   . Depression    Past Surgical History  Procedure Laterality Date  . Shoulder sugery  01/2010    to remove plate in shoulder  . Tooth extraction  11/2011   No family history on file. History  Substance Use Topics  . Smoking status: Never Smoker   . Smokeless tobacco: Never Used  . Alcohol Use: No    Review of Systems  Constitutional: Negative  for fever, chills, diaphoresis, activity change, appetite change and fatigue.  HENT: Negative for congestion, sore throat, rhinorrhea, sneezing, trouble swallowing and neck pain.   Eyes: Negative for pain and redness.  Respiratory: Negative for cough, choking, chest tightness, shortness of breath, wheezing and stridor.   Cardiovascular: Negative for chest pain and leg swelling.  Gastrointestinal: Positive for nausea, vomiting (x1/day for 5 days, nonbilious nonbloody), abdominal pain, anorexia (decreased due to abdominal pain) and change in bowel habit (+ IBS, unchanged from baseline). Negative for diarrhea, constipation, blood in stool, abdominal distention and anal bleeding.  Musculoskeletal: Negative for myalgias, back pain, joint swelling and arthralgias.  Skin: Negative for rash.  Neurological: Negative for dizziness, vertigo, speech difficulty, weakness, light-headedness, numbness and headaches.  Hematological: Negative for adenopathy.  Psychiatric/Behavioral: Negative for confusion.    Allergies  Crestor and Sulfa antibiotics  Home Medications   Current Outpatient Rx  Name  Route  Sig  Dispense  Refill  . clonazePAM (KLONOPIN) 1 MG tablet   Oral   Take 1 mg by mouth 2 (two) times daily as needed for anxiety.         . dicyclomine (BENTYL) 20 MG tablet   Oral   Take 1 tablet (20 mg total) by mouth 4 (four) times daily as needed (abd cramping).   50 tablet   1   . DULoxetine (CYMBALTA) 30 MG capsule   Oral   Take 1 capsule (30 mg total) by mouth 2 (two) times daily.   60 capsule   2     Pt will need  ov with Dr. Kriste Basque for further refills   . HYDROcodone-acetaminophen (NORCO/VICODIN) 5-325 MG per tablet   Oral   Take 1 tablet by mouth 2 (two) times daily as needed for pain.         . hyoscyamine (LEVSIN) 0.125 MG tablet   Oral   Take 1 tablet (0.125 mg total) by mouth 4 (four) times daily as needed for cramping.   50 tablet   1   . ZOFRAN 8 MG tablet   Oral    Take 1 tablet (8 mg total) by mouth 4 (four) times daily as needed for nausea.   50 tablet   1     Dispense as written.    BP 142/99  Pulse 77  Temp(Src) 97.8 F (36.6 C) (Oral)  Resp 16  SpO2 95% Physical Exam  Nursing note and vitals reviewed. Constitutional: He is oriented to person, place, and time. He appears well-developed and well-nourished. He appears distressed (due to pain).  HENT:  Head: Normocephalic and atraumatic.  Eyes: Conjunctivae and EOM are normal. Right eye exhibits no discharge. Left eye exhibits no discharge.  Neck: Normal range of motion. Neck supple. No tracheal deviation present.  Cardiovascular: Regular rhythm and normal heart sounds.  Tachycardia present.  Exam reveals no friction rub.   No murmur heard. Pulmonary/Chest: Effort normal and breath sounds normal. No stridor. No respiratory distress. He has no wheezes. He has no rales. He exhibits no tenderness.  Abdominal: Soft. He exhibits no distension. There is tenderness (RUQ and LLQ). There is no rebound and no guarding.  Neurological: He is alert and oriented to person, place, and time.  Skin: Skin is warm.  Psychiatric: He has a normal mood and affect.    ED Course  Procedures (including critical care time) Labs Reviewed  COMPREHENSIVE METABOLIC PANEL - Abnormal; Notable for the following:    Glucose, Bld 110 (*)    Creatinine, Ser 1.47 (*)    GFR calc non Af Amer 55 (*)    GFR calc Af Amer 64 (*)    All other components within normal limits  CBC WITH DIFFERENTIAL  LIPASE, BLOOD  CG4 I-STAT (LACTIC ACID)   No results found. 1. Abdominal pain    Results for orders placed during the hospital encounter of 07/14/12  CBC WITH DIFFERENTIAL      Result Value Range   WBC 9.2  4.0 - 10.5 K/uL   RBC 5.22  4.22 - 5.81 MIL/uL   Hemoglobin 16.3  13.0 - 17.0 g/dL   HCT 09.8  11.9 - 14.7 %   MCV 88.1  78.0 - 100.0 fL   MCH 31.2  26.0 - 34.0 pg   MCHC 35.4  30.0 - 36.0 g/dL   RDW 82.9  56.2 - 13.0  %   Platelets 346  150 - 400 K/uL   Neutrophils Relative % 72  43 - 77 %   Neutro Abs 6.6  1.7 - 7.7 K/uL   Lymphocytes Relative 20  12 - 46 %   Lymphs Abs 1.8  0.7 - 4.0 K/uL   Monocytes Relative 7  3 - 12 %   Monocytes Absolute 0.6  0.1 - 1.0 K/uL   Eosinophils Relative 1  0 - 5 %   Eosinophils Absolute 0.1  0.0 - 0.7 K/uL   Basophils Relative 0  0 - 1 %   Basophils Absolute 0.0  0.0 - 0.1 K/uL  COMPREHENSIVE METABOLIC PANEL      Result Value  Range   Sodium 139  135 - 145 mEq/L   Potassium 4.6  3.5 - 5.1 mEq/L   Chloride 102  96 - 112 mEq/L   CO2 27  19 - 32 mEq/L   Glucose, Bld 110 (*) 70 - 99 mg/dL   BUN 12  6 - 23 mg/dL   Creatinine, Ser 1.61 (*) 0.50 - 1.35 mg/dL   Calcium 9.3  8.4 - 09.6 mg/dL   Total Protein 7.3  6.0 - 8.3 g/dL   Albumin 3.9  3.5 - 5.2 g/dL   AST 24  0 - 37 U/L   ALT 28  0 - 53 U/L   Alkaline Phosphatase 72  39 - 117 U/L   Total Bilirubin 0.8  0.3 - 1.2 mg/dL   GFR calc non Af Amer 55 (*) >90 mL/min   GFR calc Af Amer 64 (*) >90 mL/min  LIPASE, BLOOD      Result Value Range   Lipase 17  11 - 59 U/L  CG4 I-STAT (LACTIC ACID)      Result Value Range   Lactic Acid, Venous 1.13  0.5 - 2.2 mmol/L    MDM  Leafy Ro 47 y.o. presents with abdominal pain. He's afebrile. Hemodynamically stable. Left lower quadrant and right upper quadrant tenderness palpation. No signs or symptoms of peritonitis. Heel tap negative. Pain control nausea control and IV fluids given.  White blood cell count within normal limits. Lipase negative. Lactic acid within normal limits. No electrolyte disturbance. Doubt diverticulitis as patient's symptoms have improved on serial exams and white count within normal limits, in the setting of a recent CT scan that was negative. Recent quadrant ultrasound 2 months ago showed no gallstones. Doubt the development of gallstones as being the etiology of his symptoms. Could be functional abdominal pain and patient instructed to continue  Bentyl which she has. He was dc'd home. Instructed to fu with PCP as needed, and GI as planned. Gave strong return precautions, which patient understood. Labs, old notes, and old imaging reviewed. I discussed this patient's care with my attending, Dr. Rubin Payor.  Sena Hitch, MD 07/15/12 [phone]

## 2012-07-14 NOTE — Telephone Encounter (Signed)
Pt aware will forward to SN as an FYI. He stated he is actually headed to [facility] right now.

## 2012-07-15 ENCOUNTER — Other Ambulatory Visit: Payer: Self-pay | Admitting: Pulmonary Disease

## 2012-07-15 DIAGNOSIS — R109 Unspecified abdominal pain: Secondary | ICD-10-CM

## 2012-07-15 NOTE — ED Provider Notes (Signed)
I saw and evaluated the patient, reviewed the resident's note and I agree with the findings and plan. Acute on chronic abdominal pain. Recently had CT that did not show diverticulitis. Similar symptoms today. Doubt diverticulitis now. Patient feels better after treatment will be discharged home to followup with GI  Juliet Rude. Rubin Payor, MD 07/15/12 [phone]

## 2012-07-16 ENCOUNTER — Telehealth: Payer: Self-pay | Admitting: Gastroenterology

## 2012-07-16 NOTE — Telephone Encounter (Signed)
Pt seen in ER on 07/14/12 for: Abdominal Pain  This is a recurrent (acutely worsened 4-5 days ago) problem. The current episode started more than 1 month ago (started 3 months ago). The problem occurs constantly. The problem has been gradually worsening. Associated symptoms include abdominal pain, anorexia (decreased due to abdominal pain), a change in bowel habit (+ IBS, unchanged from baseline), nausea and vomiting (x1/day for 5 days, nonbilious nonbloody). Pertinent negatives include no arthralgias, chest pain, chills, congestion, coughing, diaphoresis, fatigue, fever, headaches, joint swelling, myalgias, neck pain, numbness, rash, sore throat, urinary symptoms, vertigo, visual change or weakness. The symptoms are aggravated by eating (30 minutes after). He has tried NSAIDs, lying down, oral narcotics and position changes for the symptoms. The treatment provided no relief.  Pt had CT on 07/07/12 that was negative and an U/S on 04/16/12 that was negative for gallstones. Called [facility] and scheduled pt to see Mike Gip, PA in am. Bjorn Loser does not know if pt will see Dr Kriste Basque, but that appt is at [facility]; she will contact the pt.

## 2012-07-17 ENCOUNTER — Ambulatory Visit (INDEPENDENT_AMBULATORY_CARE_PROVIDER_SITE_OTHER): Payer: Managed Care, Other (non HMO) | Admitting: Physician Assistant

## 2012-07-17 ENCOUNTER — Encounter: Payer: Self-pay | Admitting: Gastroenterology

## 2012-07-17 ENCOUNTER — Ambulatory Visit: Payer: Managed Care, Other (non HMO) | Admitting: Pulmonary Disease

## 2012-07-17 ENCOUNTER — Other Ambulatory Visit (INDEPENDENT_AMBULATORY_CARE_PROVIDER_SITE_OTHER): Payer: Managed Care, Other (non HMO)

## 2012-07-17 ENCOUNTER — Encounter: Payer: Self-pay | Admitting: Physician Assistant

## 2012-07-17 VITALS — BP 120/66 | HR 63 | Ht 76.0 in | Wt 251.4 lb

## 2012-07-17 DIAGNOSIS — R109 Unspecified abdominal pain: Secondary | ICD-10-CM

## 2012-07-17 DIAGNOSIS — K5732 Diverticulitis of large intestine without perforation or abscess without bleeding: Secondary | ICD-10-CM

## 2012-07-17 DIAGNOSIS — R112 Nausea with vomiting, unspecified: Secondary | ICD-10-CM

## 2012-07-17 DIAGNOSIS — R634 Abnormal weight loss: Secondary | ICD-10-CM

## 2012-07-17 LAB — CBC WITH DIFFERENTIAL/PLATELET
Basophils Relative: 0.3 % (ref 0.0–3.0)
Eosinophils Absolute: 0.2 10*3/uL (ref 0.0–0.7)
Lymphocytes Relative: 30.9 % (ref 12.0–46.0)
MCHC: 33.4 g/dL (ref 30.0–36.0)
Monocytes Absolute: 0.8 10*3/uL (ref 0.1–1.0)
Neutrophils Relative %: 58.4 % (ref 43.0–77.0)
Platelets: 345 10*3/uL (ref 150.0–400.0)
RBC: 4.47 Mil/uL (ref 4.22–5.81)
WBC: 9.4 10*3/uL (ref 4.5–10.5)

## 2012-07-17 LAB — URINALYSIS
Bilirubin Urine: NEGATIVE
Hgb urine dipstick: NEGATIVE
Leukocytes, UA: NEGATIVE
Nitrite: NEGATIVE
Total Protein, Urine: NEGATIVE
Urobilinogen, UA: 0.2 (ref 0.0–1.0)

## 2012-07-17 LAB — HIGH SENSITIVITY CRP: CRP, High Sensitivity: 29.32 mg/L — ABNORMAL HIGH (ref 0.000–5.000)

## 2012-07-17 MED ORDER — MOVIPREP 100 G PO SOLR: 1.0000 | Freq: Once | ORAL | Status: DC

## 2012-07-17 MED ORDER — CIPROFLOXACIN HCL 500 MG PO TABS: 500.0000 mg | ORAL_TABLET | Freq: Two times a day (BID) | ORAL | Status: AC

## 2012-07-17 MED ORDER — METRONIDAZOLE 500 MG PO TABS: 500.0000 mg | ORAL_TABLET | Freq: Two times a day (BID) | ORAL | Status: AC

## 2012-07-17 NOTE — Patient Instructions (Addendum)
You have been scheduled for a colonoscopy with propofol. Please follow written instructions given to you at your visit today.  Please pick up your prep kit at the pharmacy within the next 1-3 days. We sent the prescription to [facility] [address]. We also sent prescriptions for Cipro and Flagyl. If you use inhalers (even only as needed), please bring them with you on the day of your procedure. Your physician has requested that you go to www.startemmi.com and enter the access code given to you at your visit today. This web site gives a general overview about your procedure. However, you should still follow specific instructions given to you by our office regarding your preparation for the procedure.

## 2012-07-17 NOTE — Progress Notes (Signed)
Reviewed and agree with management plan.  Sherhonda Gaspar T. Evaleigh Mccamy, MD FACG 

## 2012-07-17 NOTE — Progress Notes (Signed)
Subjective:    Patient ID: Kurt Mcdonald, male    DOB: [birth year], 47 y.o.   MRN: [id number]  HPI  Kurt Mcdonald is a 47 year old white male new to GI today referred by Dr. Kriste Mcdonald for evaluation of 3-4 week history of persistent abdominal pain associated with nausea and intermittent vomiting. Patient relates that he has a history of constipation over the past 3-4 years and says that he will go for several days without a bowel movement and and when he does have a bowel movement it will be diarrhea associated with abdominal cramping he says he did undergo a colonoscopy at one point several years ago [place] and was told it was normal but does not remember who did this procedure. Now over the past 3-4 weeks he has developed more constant lower abdominal pain which is worse on the left than the right and associated with sharp stabbing pains. He is having 2-3 loose bowel movements per day no melena or hematochezia. He says sometimes he does break out in a sweat with a bowel movement feels weak and has to lie down. Says his appetite has been poor and that his abdomen hurts worse after eating and he has lost about 20 pounds over the past few months. He feels that he gets nauseated due to pain and has occasional episodes of vomiting. Not had any documented fever. He did undergo CT scan of the abdomen and pelvis on 07/01/2012 with contrast and this showed left-sided diverticular disease but no evidence of diverticulitis or other acute abnormality Had an ER visit on 07/14/2012 was noted to have an elevated white count of 14.3 on July 1 and this was down to 9.2 on the 14th. He has been taking Norco for pain and using Bentyl 3-4 times daily as needed for cramping and Zofran 8 mg every 6 hours as needed for nausea. He says this combination is helping him but his symptoms are not resolve Family history is negative for GI disease and he has not had any prior abdominal surgery    Review of Systems  Constitutional:  Positive for appetite change, fatigue and unexpected weight change.  HENT: Negative.   Eyes: Negative.   Respiratory: Negative.   Cardiovascular: Negative.   Gastrointestinal: Positive for nausea, vomiting and abdominal pain.  Endocrine: Negative.   Genitourinary: Negative.   Skin: Negative.   Allergic/Immunologic: Negative.   Neurological: Positive for weakness.  Hematological: Negative.   Psychiatric/Behavioral: Negative.    Outpatient Prescriptions Prior to Visit  Medication Sig Dispense Refill  . clonazePAM (KLONOPIN) 1 MG tablet Take 1 mg by mouth 2 (two) times daily as needed for anxiety.      . dicyclomine (BENTYL) 20 MG tablet Take 1 tablet (20 mg total) by mouth 4 (four) times daily as needed (abd cramping).  50 tablet  1  . DULoxetine (CYMBALTA) 30 MG capsule Take 1 capsule (30 mg total) by mouth 2 (two) times daily.  60 capsule  2  . HYDROcodone-acetaminophen (NORCO/VICODIN) 5-325 MG per tablet Take 1 tablet by mouth 2 (two) times daily as needed for pain.      . hyoscyamine (LEVSIN) 0.125 MG tablet Take 1 tablet (0.125 mg total) by mouth 4 (four) times daily as needed for cramping.  50 tablet  1  . ZOFRAN 8 MG tablet Take 1 tablet (8 mg total) by mouth 4 (four) times daily as needed for nausea.  50 tablet  1   No facility-administered medications prior to visit.  Allergies  Allergen Reactions  . Crestor (Rosuvastatin Calcium)     Severe leg cramping  . Sulfa Antibiotics     rash   Patient Active Problem List   Diagnosis Date Noted  . Abdominal pain 07/14/2012  . Abdominal  pain, other specified site 07/01/2012  . Nausea and vomiting 07/01/2012  . Dyspnea 06/16/2010  . Hypercholesteremia 06/16/2010  . Overweight 06/16/2010  . Diverticulosis 06/16/2010  . IBS (irritable bowel syndrome) 06/16/2010  . NAFLD (nonalcoholic fatty liver disease) 78/29/5621  . Hypogonadism male 06/16/2010  . Elevated liver function tests 04/17/2010  . Hx: recurrent pneumonia  04/11/2010  . LBP (low back pain) 04/11/2010  . Anxiety and depression 04/11/2010   History  Substance Use Topics  . Smoking status: Never Smoker   . Smokeless tobacco: Never Used  . Alcohol Use: No   family history includes Diabetes in an unspecified family member.     Objective:   Physical Exam [name] well-developed white male in no acute distress, uncomfortable appearing blood pressure 120/66 pulse 83 height 6 foot 4 weight 251. HEENT; nontraumatic normocephalic EOMI PERRLA sclera anicteric, Supple; no JVD, Cardiovascular; regular rate and rhythm with S1-S2 no murmur or gallop, Pulmonary; clear bilaterally, Abdomen; soft bowel sounds are active she is tender in the left mid quadrant and left lower quadrant there is no guarding or rebound no palpable mass or hepatosplenomegaly bowel sounds are present, Rectal; exam not done, Extremities; no clubbing cyanosis or edema skin warm and dry, Psych; mood and affect appropriate        Assessment & Plan:  #81  47 year old male with 3-4 year history of episodes of constipation followed by diarrhea and cramping now presenting with 4 week history of more constant primarily left-sided abdominal discomfort, nausea intermittent vomiting, weight loss and loose stools He has had leukocytosis however CT scan did not show any abnormality including any evidence of diverticulitis on July 1 Etiology of his symptoms is not clear, clinical picture is certainly consistent with diverticulitis also need to consider inflammatory bowel disease #2 anxiety and depression #3 nonalcoholic fatty liver disease  Plan; will check CBC with differential, CRP today Continue Bentyl 10 mg 3-4 times daily as needed for cramping Continue Norco as needed for pain and Zofran 8 mg every 6 hours as needed for nausea Start Cipro 500 mg by mouth twice daily and Flagyl 500 mg by mouth twice daily x10 days Have scheduled for colonoscopy with Dr. Jalene Mcdonald discussed in detail with  patient and he is agreeable to proceed Patient is advised that if his pain does not improve and/or if it worsens prior to the colonoscopy that he needs to call a had a time as we may need to delay the procedure

## 2012-07-23 ENCOUNTER — Telehealth: Payer: Self-pay | Admitting: Pulmonary Disease

## 2012-07-23 MED ORDER — FLUCONAZOLE 100 MG PO TABS: ORAL_TABLET | ORAL | Status: DC

## 2012-07-23 MED ORDER — FIRST-DUKES MOUTHWASH MT SUSP: OROMUCOSAL | Status: DC

## 2012-07-23 NOTE — Telephone Encounter (Signed)
Per SN: MMW #4oz, Diflucan 100mg  #6  2po today then 1 daily until gone.  Called spoke with patient, advised of SN's recs as stated above.  Pt verbalized his understanding and denied any further questions/concerns.  Rx's sent to verified pharmacy.

## 2012-07-23 NOTE — Telephone Encounter (Signed)
On 07-17-12 pt was prescribed Cipro from GI doctor and now he complains of having sores in his mouth and throat. Please advise.Carron Curie, CMA. Allergies  Allergen Reactions  . Crestor (Rosuvastatin Calcium)     Severe leg cramping  . Sulfa Antibiotics     rash

## 2012-07-24 ENCOUNTER — Ambulatory Visit: Payer: Managed Care, Other (non HMO) | Admitting: Gastroenterology

## 2012-08-13 ENCOUNTER — Telehealth: Payer: Self-pay | Admitting: Gastroenterology

## 2012-08-13 NOTE — Telephone Encounter (Signed)
No charge given circumstances

## 2012-08-14 ENCOUNTER — Encounter: Payer: Managed Care, Other (non HMO) | Admitting: Gastroenterology

## 2012-10-23 ENCOUNTER — Telehealth: Payer: Self-pay | Admitting: Pulmonary Disease

## 2012-10-23 NOTE — Telephone Encounter (Signed)
Per SN---  Continue the cymbalta and he will need to see a counselor---we can refer him to see Dr. Dellia Cloud if he would like.  thanks

## 2012-10-23 NOTE — Telephone Encounter (Signed)
I spoke with pt. He does not want to go see him at this time. He reports he will await his APPT with SN. Nothing further needed

## 2012-10-23 NOTE — Telephone Encounter (Signed)
Pt scheduled appt. He is taking cymbalta 30 mg BID. He reports he is feeling so tired and no energy. Feels like everything is hitting him at one time. He is wanting to know until his appt if SN has an recommendations if he should take something to help. Please advise thanks  Allergies  Allergen Reactions  . Crestor [Rosuvastatin Calcium]     Severe leg cramping  . Sulfa Antibiotics     rash

## 2012-10-23 NOTE — Telephone Encounter (Signed)
I spoke with pt. He requesting to see SN as soon as he can. Please advise were pt can be seen? thanks

## 2012-10-23 NOTE — Telephone Encounter (Signed)
So sorry to hear about his loss.  First opening with SN is Nov 4 at 46.

## 2012-10-27 ENCOUNTER — Other Ambulatory Visit: Payer: Self-pay | Admitting: Pulmonary Disease

## 2012-11-04 ENCOUNTER — Ambulatory Visit: Payer: Managed Care, Other (non HMO) | Admitting: Pulmonary Disease

## 2012-11-07 ENCOUNTER — Encounter: Payer: Self-pay | Admitting: Pulmonary Disease

## 2012-12-12 ENCOUNTER — Other Ambulatory Visit: Payer: Self-pay | Admitting: Pulmonary Disease

## 2012-12-29 ENCOUNTER — Telehealth: Payer: Self-pay | Admitting: Pulmonary Disease

## 2012-12-29 MED ORDER — ONDANSETRON HCL 4 MG PO TABS: 4.0000 mg | ORAL_TABLET | ORAL | Status: DC | PRN

## 2012-12-29 MED ORDER — LEVOFLOXACIN 500 MG PO TABS: 500.0000 mg | ORAL_TABLET | Freq: Every day | ORAL | Status: DC

## 2012-12-29 NOTE — Telephone Encounter (Signed)
Pt advised of recs and rx sent. Noeh Sparacino, CMA  

## 2012-12-29 NOTE — Telephone Encounter (Signed)
Per SN----  Cough with green sputum--sounds like acute bronchitis not flu.  Call in levaquin 500 mg  #7  1 daily Align once daily Tylenol Increase fluids zofran 4 mg   #30  1 po every 4 hours as needed for nausea

## 2012-12-29 NOTE — Telephone Encounter (Signed)
Last OV 07-01-12. I spoke with the pt and he states that his son was confirmed to have the flu over the christmas and is on tamiflu. He states that over the last 3 days he began to have fever, vomiting, body aches and chills, chest congestion with productive cough with green phlegm. He feels he may have the flu as well. Please advise. Carron Curie, CMA Allergies  Allergen Reactions  . Crestor [Rosuvastatin Calcium]     Severe leg cramping  . Sulfa Antibiotics     rash

## 2013-02-10 ENCOUNTER — Other Ambulatory Visit: Payer: Self-pay | Admitting: Pulmonary Disease

## 2013-03-19 ENCOUNTER — Telehealth: Payer: Self-pay | Admitting: Pulmonary Disease

## 2013-03-19 MED ORDER — LEVOFLOXACIN 500 MG PO TABS: 500.0000 mg | ORAL_TABLET | Freq: Every day | ORAL | Status: DC

## 2013-03-19 NOTE — Telephone Encounter (Signed)
Pt is aware of SN's recs. Rx will be sent in.  Has upcoming appointment with SN on 03/26/13. Advised him of SN's retirement from primary care. States he will discuss with SN about where to transfer his care at his appointment.

## 2013-03-19 NOTE — Telephone Encounter (Signed)
Spoke with pt. Reports cough, chest congestion and sinus congestion. Mucus is green in color. Onset was 2 weeks ago. Has been using Benadryl and OTC Sinus medication with no relief. Would like something called in.  Allergies  Allergen Reactions  . Crestor [Rosuvastatin Calcium]     Severe leg cramping  . Sulfa Antibiotics     rash   SN - please advise. Thanks.

## 2013-03-19 NOTE — Telephone Encounter (Signed)
Per SN---  levaquin 500 mg  #7  1 daily Align once daily mucinex 600 mg  2 po bid  Increase fluids.   

## 2013-03-26 ENCOUNTER — Ambulatory Visit (INDEPENDENT_AMBULATORY_CARE_PROVIDER_SITE_OTHER): Payer: BC Managed Care – PPO | Admitting: Pulmonary Disease

## 2013-03-26 ENCOUNTER — Encounter: Payer: Self-pay | Admitting: Pulmonary Disease

## 2013-03-26 VITALS — BP 142/88 | HR 94 | Temp 99.0°F | Ht 76.0 in | Wt 262.8 lb

## 2013-03-26 DIAGNOSIS — M545 Low back pain, unspecified: Secondary | ICD-10-CM

## 2013-03-26 DIAGNOSIS — E7800 Pure hypercholesterolemia, unspecified: Secondary | ICD-10-CM

## 2013-03-26 DIAGNOSIS — K5790 Diverticulosis of intestine, part unspecified, without perforation or abscess without bleeding: Secondary | ICD-10-CM

## 2013-03-26 DIAGNOSIS — E663 Overweight: Secondary | ICD-10-CM

## 2013-03-26 DIAGNOSIS — K760 Fatty (change of) liver, not elsewhere classified: Secondary | ICD-10-CM

## 2013-03-26 DIAGNOSIS — E291 Testicular hypofunction: Secondary | ICD-10-CM

## 2013-03-26 DIAGNOSIS — K7689 Other specified diseases of liver: Secondary | ICD-10-CM

## 2013-03-26 DIAGNOSIS — R109 Unspecified abdominal pain: Secondary | ICD-10-CM

## 2013-03-26 DIAGNOSIS — K589 Irritable bowel syndrome without diarrhea: Secondary | ICD-10-CM

## 2013-03-26 DIAGNOSIS — K5730 Diverticulosis of large intestine without perforation or abscess without bleeding: Secondary | ICD-10-CM

## 2013-03-26 MED ORDER — DULOXETINE HCL 30 MG PO CPEP: ORAL_CAPSULE | ORAL | Status: DC

## 2013-03-26 MED ORDER — METRONIDAZOLE 500 MG PO TABS: 500.0000 mg | ORAL_TABLET | Freq: Three times a day (TID) | ORAL | Status: DC

## 2013-03-26 MED ORDER — CLONAZEPAM 1 MG PO TABS: ORAL_TABLET | ORAL | Status: DC

## 2013-03-26 MED ORDER — DICYCLOMINE HCL 20 MG PO TABS: ORAL_TABLET | ORAL | Status: DC

## 2013-03-26 NOTE — Patient Instructions (Signed)
Today we updated your med list in our EPIC system...    Continue your current medications the same...  We wrote a new prescription for FLAGYL 500mg  one tab three time daily for 10d for the diarrhea syndrome...    Continue the probiotic like ALIGN or [employer] colon health daily...    And you may use the Activia yogurt as well...  We refilled the BENTYL (Dicyclomine) 20mg  one tab up to 3 times daily for the abh cramping...  We will set up a referral to DrStark & the GI Team as well to recheck you & the timing of the colonoscopy...  Call for any questions or if we can be of service in any way.Marland KitchenMarland Kitchen
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
## 2013-04-01 ENCOUNTER — Encounter (HOSPITAL_BASED_OUTPATIENT_CLINIC_OR_DEPARTMENT_OTHER): Payer: Self-pay | Admitting: Emergency Medicine

## 2013-04-01 ENCOUNTER — Telehealth: Payer: Self-pay | Admitting: Pulmonary Disease

## 2013-04-01 ENCOUNTER — Emergency Department (HOSPITAL_BASED_OUTPATIENT_CLINIC_OR_DEPARTMENT_OTHER): Payer: BC Managed Care – PPO

## 2013-04-01 ENCOUNTER — Emergency Department (HOSPITAL_BASED_OUTPATIENT_CLINIC_OR_DEPARTMENT_OTHER)
Admission: EM | Admit: 2013-04-01 | Discharge: 2013-04-01 | Disposition: A | Discharge status: Home / Self Care | Payer: BC Managed Care – PPO | Attending: Emergency Medicine | Admitting: Emergency Medicine

## 2013-04-01 DIAGNOSIS — Z792 Long term (current) use of antibiotics: Secondary | ICD-10-CM | POA: Insufficient documentation

## 2013-04-01 DIAGNOSIS — F329 Major depressive disorder, single episode, unspecified: Secondary | ICD-10-CM | POA: Insufficient documentation

## 2013-04-01 DIAGNOSIS — F411 Generalized anxiety disorder: Secondary | ICD-10-CM | POA: Insufficient documentation

## 2013-04-01 DIAGNOSIS — R630 Anorexia: Secondary | ICD-10-CM | POA: Insufficient documentation

## 2013-04-01 DIAGNOSIS — Z79899 Other long term (current) drug therapy: Secondary | ICD-10-CM | POA: Insufficient documentation

## 2013-04-01 DIAGNOSIS — K589 Irritable bowel syndrome without diarrhea: Secondary | ICD-10-CM | POA: Insufficient documentation

## 2013-04-01 DIAGNOSIS — F3289 Other specified depressive episodes: Secondary | ICD-10-CM | POA: Insufficient documentation

## 2013-04-01 DIAGNOSIS — E663 Overweight: Secondary | ICD-10-CM | POA: Insufficient documentation

## 2013-04-01 DIAGNOSIS — K5730 Diverticulosis of large intestine without perforation or abscess without bleeding: Secondary | ICD-10-CM | POA: Insufficient documentation

## 2013-04-01 DIAGNOSIS — Z8701 Personal history of pneumonia (recurrent): Secondary | ICD-10-CM | POA: Insufficient documentation

## 2013-04-01 DIAGNOSIS — R1031 Right lower quadrant pain: Secondary | ICD-10-CM | POA: Insufficient documentation

## 2013-04-01 DIAGNOSIS — G8929 Other chronic pain: Secondary | ICD-10-CM | POA: Insufficient documentation

## 2013-04-01 DIAGNOSIS — R109 Unspecified abdominal pain: Secondary | ICD-10-CM

## 2013-04-01 DIAGNOSIS — R1032 Left lower quadrant pain: Secondary | ICD-10-CM | POA: Insufficient documentation

## 2013-04-01 DIAGNOSIS — K5790 Diverticulosis of intestine, part unspecified, without perforation or abscess without bleeding: Secondary | ICD-10-CM

## 2013-04-01 LAB — COMPREHENSIVE METABOLIC PANEL
ALT: 42 U/L (ref 0–53)
AST: 27 U/L (ref 0–37)
Albumin: 4 g/dL (ref 3.5–5.2)
Alkaline Phosphatase: 59 U/L (ref 39–117)
BILIRUBIN TOTAL: 0.5 mg/dL (ref 0.3–1.2)
BUN: 12 mg/dL (ref 6–23)
CALCIUM: 10 mg/dL (ref 8.4–10.5)
CO2: 26 meq/L (ref 19–32)
Chloride: 102 mEq/L (ref 96–112)
Creatinine, Ser: 1.2 mg/dL (ref 0.50–1.35)
GFR, EST AFRICAN AMERICAN: 82 mL/min — AB (ref >90)
GFR, EST NON AFRICAN AMERICAN: 70 mL/min — AB (ref >90)
GLUCOSE: 99 mg/dL (ref 70–99)
Potassium: 4.1 mEq/L (ref 3.7–5.3)
SODIUM: 142 meq/L (ref 137–147)
Total Protein: 7.5 g/dL (ref 6.0–8.3)

## 2013-04-01 LAB — CBC WITH DIFFERENTIAL/PLATELET
Basophils Absolute: 0.1 10*3/uL (ref 0.0–0.1)
Basophils Relative: 1 % (ref 0–1)
EOS PCT: 2 % (ref 0–5)
Eosinophils Absolute: 0.2 10*3/uL (ref 0.0–0.7)
HEMATOCRIT: 42.5 % (ref 39.0–52.0)
Hemoglobin: 15 g/dL (ref 13.0–17.0)
LYMPHS ABS: 2.3 10*3/uL (ref 0.7–4.0)
LYMPHS PCT: 35 % (ref 12–46)
MCH: 31.1 pg (ref 26.0–34.0)
MCHC: 35.3 g/dL (ref 30.0–36.0)
MCV: 88 fL (ref 78.0–100.0)
MONO ABS: 0.6 10*3/uL (ref 0.1–1.0)
Monocytes Relative: 10 % (ref 3–12)
Neutro Abs: 3.6 10*3/uL (ref 1.7–7.7)
Neutrophils Relative %: 53 % (ref 43–77)
PLATELETS: 367 10*3/uL (ref 150–400)
RBC: 4.83 MIL/uL (ref 4.22–5.81)
RDW: 13.8 % (ref 11.5–15.5)
WBC: 6.7 10*3/uL (ref 4.0–10.5)

## 2013-04-01 LAB — URINALYSIS, ROUTINE W REFLEX MICROSCOPIC
Bilirubin Urine: NEGATIVE
GLUCOSE, UA: NEGATIVE mg/dL
HGB URINE DIPSTICK: NEGATIVE
Ketones, ur: NEGATIVE mg/dL
Leukocytes, UA: NEGATIVE
Nitrite: NEGATIVE
Protein, ur: NEGATIVE mg/dL
SPECIFIC GRAVITY, URINE: 1.006 (ref 1.005–1.030)
Urobilinogen, UA: 0.2 mg/dL (ref 0.0–1.0)
pH: 6 (ref 5.0–8.0)

## 2013-04-01 MED ORDER — ACETAMINOPHEN 325 MG PO TABS
ORAL_TABLET | ORAL | Status: AC
  Filled 2013-04-01: qty 2

## 2013-04-01 MED ORDER — HYDROMORPHONE HCL PF 1 MG/ML IJ SOLN
1.0000 mg | Freq: Once | INTRAMUSCULAR | Status: AC
  Administered 2013-04-01: 1 mg via INTRAVENOUS

## 2013-04-01 MED ORDER — IOHEXOL 300 MG/ML  SOLN
50.0000 mL | Freq: Once | INTRAMUSCULAR | Status: AC | PRN
  Administered 2013-04-01: 50 mL via ORAL

## 2013-04-01 MED ORDER — ACETAMINOPHEN 325 MG PO TABS
650.0000 mg | ORAL_TABLET | Freq: Once | ORAL | Status: AC
  Administered 2013-04-01: 650 mg via ORAL

## 2013-04-01 MED ORDER — OXYCODONE-ACETAMINOPHEN 5-325 MG PO TABS: 2.0000 | ORAL_TABLET | ORAL | Status: DC | PRN

## 2013-04-01 MED ORDER — IOHEXOL 300 MG/ML  SOLN
100.0000 mL | Freq: Once | INTRAMUSCULAR | Status: AC | PRN
  Administered 2013-04-01: 100 mL via INTRAVENOUS

## 2013-04-01 MED ORDER — CIPROFLOXACIN HCL 500 MG PO TABS: 500.0000 mg | ORAL_TABLET | Freq: Two times a day (BID) | ORAL | Status: DC

## 2013-04-01 MED ORDER — ONDANSETRON HCL 4 MG/2ML IJ SOLN
4.0000 mg | Freq: Once | INTRAMUSCULAR | Status: AC
  Administered 2013-04-01: 4 mg via INTRAMUSCULAR
  Filled 2013-04-01: qty 2

## 2013-04-01 MED ORDER — HYDROMORPHONE HCL PF 1 MG/ML IJ SOLN: 1.0000 mg | Freq: Once | INTRAMUSCULAR | Status: DC

## 2013-04-01 MED ORDER — SODIUM CHLORIDE 0.9 % IV BOLUS (SEPSIS)
1000.0000 mL | Freq: Once | INTRAVENOUS | Status: AC
  Administered 2013-04-01: 1000 mL via INTRAVENOUS

## 2013-04-01 MED ORDER — ACETAMINOPHEN 325 MG PO TABS: 650.0000 mg | ORAL_TABLET | Freq: Four times a day (QID) | ORAL | Status: DC | PRN

## 2013-04-01 MED ORDER — HYDROMORPHONE HCL PF 1 MG/ML IJ SOLN
INTRAMUSCULAR | Status: AC
  Filled 2013-04-01: qty 1

## 2013-04-01 MED ORDER — HYDROMORPHONE HCL PF 1 MG/ML IJ SOLN
1.0000 mg | Freq: Once | INTRAMUSCULAR | Status: AC
  Administered 2013-04-01: 1 mg via INTRAVENOUS
  Filled 2013-04-01: qty 1

## 2013-04-01 NOTE — ED Notes (Signed)
Pt is unable to void at present time. 

## 2013-04-01 NOTE — ED Notes (Signed)
Pt c/o diarrhea and abdominal pain x 2 wks and has seen PCP for same. Is currently taking meds for same. Pt reports not feeling any better and PCP did not return call today.

## 2013-04-01 NOTE — Telephone Encounter (Signed)
Per SN---  Continue the rx and follow up with GI---Dr. Fuller Plan for eval.

## 2013-04-01 NOTE — Telephone Encounter (Signed)
LMOM TCB x1 - there is only one number on file for pt Per pt's chart he has gone to the ED today 4.1.15

## 2013-04-01 NOTE — ED Notes (Signed)
PA at bedside discussing test results and dispo plan of care. 

## 2013-04-01 NOTE — ED Notes (Signed)
Returned from CT; no change in pt condition.

## 2013-04-01 NOTE — ED Notes (Signed)
Report received from Vickie Henson, RN care assumed. 

## 2013-04-01 NOTE — Discharge Instructions (Signed)
Abdominal Pain, Adult Many things can cause abdominal pain. Usually, abdominal pain is not caused by a disease and will improve without treatment. It can often be observed and treated at home. Your health care provider will do a physical exam and possibly order blood tests and X-rays to help determine the seriousness of your pain. However, in many cases, more time must pass before a clear cause of the pain can be found. Before that point, your health care provider may not know if you need more testing or further treatment. HOME CARE INSTRUCTIONS  Monitor your abdominal pain for any changes. The following actions may help to alleviate any discomfort you are experiencing:  Only take over-the-counter or prescription medicines as directed by your health care provider.  Do not take laxatives unless directed to do so by your health care provider.  Try a clear liquid diet (broth, tea, or water) as directed by your health care provider. Slowly move to a bland diet as tolerated. SEEK MEDICAL CARE IF:  You have unexplained abdominal pain.  You have abdominal pain associated with nausea or diarrhea.  You have pain when you urinate or have a bowel movement.  You experience abdominal pain that wakes you in the night.  You have abdominal pain that is worsened or improved by eating food.  You have abdominal pain that is worsened with eating fatty foods. SEEK IMMEDIATE MEDICAL CARE IF:   Your pain does not go away within 2 hours.  You have a fever.  You keep throwing up (vomiting).  Your pain is felt only in portions of the abdomen, such as the right side or the left lower portion of the abdomen.  You pass bloody or black tarry stools. MAKE SURE YOU:  Understand these instructions.   Will watch your condition.   Will get help right away if you are not doing well or get worse.  Document Released: 09/27/2004 Document Revised: 10/08/2012 Document Reviewed: 08/27/2012 [facility] Patient  Information 2014 [place].  Diverticulosis Diverticulosis is a common condition that develops when small pouches (diverticula) form in the wall of the colon. The risk of diverticulosis increases with age. It happens more often in people who eat a low-fiber diet. Most individuals with diverticulosis have no symptoms. Those individuals with symptoms usually experience abdominal pain, constipation, or loose stools (diarrhea). HOME CARE INSTRUCTIONS   Increase the amount of fiber in your diet as directed by your caregiver or dietician. This may reduce symptoms of diverticulosis.  Your caregiver may recommend taking a dietary fiber supplement.  Drink at least 6 to 8 glasses of water each day to prevent constipation.  Try not to strain when you have a bowel movement.  Your caregiver may recommend avoiding nuts and seeds to prevent complications, although this is still an uncertain benefit.  Only take over-the-counter or prescription medicines for pain, discomfort, or fever as directed by your caregiver. FOODS WITH HIGH FIBER CONTENT INCLUDE:  Fruits. Apple, peach, pear, tangerine, raisins, prunes.  Vegetables. Brussels sprouts, asparagus, broccoli, cabbage, carrot, cauliflower, romaine lettuce, spinach, summer squash, tomato, winter squash, zucchini.  Starchy Vegetables. Baked beans, kidney beans, lima beans, split peas, lentils, potatoes (with skin).  Grains. Whole wheat bread, brown rice, bran flake cereal, plain oatmeal, white rice, shredded wheat, bran muffins. SEEK IMMEDIATE MEDICAL CARE IF:   You develop increasing pain or severe bloating.  You have an oral temperature above 102 F (38.9 C), not controlled by medicine.  You develop vomiting or bowel movements that  are bloody or black. Document Released: 09/15/2003 Document Revised: 03/12/2011 Document Reviewed: 05/18/2009 [facility] Patient Information 2014 [place].

## 2013-04-01 NOTE — ED Provider Notes (Signed)
CSN: [id number]     Arrival date & time 04/01/13  1347 History   First MD Initiated Contact with Patient 04/01/13 1515     Chief Complaint  Patient presents with  . Diarrhea     (Consider location/radiation/quality/duration/timing/severity/associated sxs/prior Treatment) Patient is a 48 y.o. male presenting with abdominal pain. The history is provided by the patient. No language interpreter was used.  Abdominal Pain Pain location:  RLQ and LLQ Pain quality: aching, bloating, gnawing and pressure   Pain radiates to:  Back Pain severity:  Severe Onset quality:  Gradual Duration:  2 weeks Timing:  Constant Chronicity:  New Context: eating and recent illness   Relieved by:  Nothing Worsened by:  Nothing tried Ineffective treatments: pain medication and flagyl. Associated symptoms: anorexia, diarrhea, nausea and vomiting   Risk factors: no alcohol abuse     Past Medical History  Diagnosis Date  . Dyspnea   . History of pneumonia   . Hypercholesteremia   . Overweight   . Diverticulosis of colon   . Irritable bowel syndrome (IBS)   . NAFLD (nonalcoholic fatty liver disease)   . Male hypogonadism   . LBP (low back pain)   . Chronic pain syndrome   . Anxiety   . Depression    Past Surgical History  Procedure Laterality Date  . Shoulder sugery  01/2010    to remove plate in shoulder  . Tooth extraction  11/2011   Family History  Problem Relation Age of Onset  . Diabetes     History  Substance Use Topics  . Smoking status: Never Smoker   . Smokeless tobacco: Never Used  . Alcohol Use: No    Review of Systems  Gastrointestinal: Positive for nausea, vomiting, abdominal pain, diarrhea and anorexia.  All other systems reviewed and are negative.      Allergies  Crestor and Sulfa antibiotics  Home Medications   Current Outpatient Rx  Name  Route  Sig  Dispense  Refill  . clonazePAM (KLONOPIN) 1 MG tablet      TAKE 1 TABLET BY MOUTH TWICE A DAY AS NEEDED   60  tablet   1   . dicyclomine (BENTYL) 20 MG tablet      Take one three times daily as needed for abdominal cramping   90 tablet   1   . DULoxetine (CYMBALTA) 30 MG capsule      TAKE ONE CAPSULE BY MOUTH TWICE A DAY   60 capsule   2   . HYDROcodone-acetaminophen (NORCO/VICODIN) 5-325 MG per tablet   Oral   Take 1 tablet by mouth 2 (two) times daily as needed for pain.         . metroNIDAZOLE (FLAGYL) 500 MG tablet   Oral   Take 1 tablet (500 mg total) by mouth 3 (three) times daily.   30 tablet   0    BP 154/93  Pulse 57  Temp(Src) 98 F (36.7 C) (Oral)  Resp 16  Ht 6\' 4"  (1.93 m)  Wt 260 lb (117.935 kg)  BMI 31.66 kg/m2  SpO2 9% Physical Exam  Nursing note and vitals reviewed. Constitutional: He is oriented to person, place, and time. He appears well-developed and well-nourished.  HENT:  Head: Normocephalic.  Mouth/Throat: Oropharynx is clear and moist.  Eyes: Conjunctivae and EOM are normal. Pupils are equal, round, and reactive to light.  Neck: Normal range of motion.  Cardiovascular: Normal rate, regular rhythm and normal heart sounds.   Pulmonary/Chest:  Effort normal and breath sounds normal.  Abdominal: He exhibits no distension. There is tenderness.  Musculoskeletal: Normal range of motion.  Neurological: He is alert and oriented to person, place, and time.  Skin: Skin is warm.  Psychiatric: He has a normal mood and affect.    ED Course  Procedures (including critical care time) Labs Review Labs Reviewed  COMPREHENSIVE METABOLIC PANEL - Abnormal; Notable for the following:    GFR calc non Af Amer 70 (*)    GFR calc Af Amer 82 (*)    All other components within normal limits  URINALYSIS, ROUTINE W REFLEX MICROSCOPIC  CBC WITH DIFFERENTIAL   Imaging Review Ct Abdomen Pelvis W Contrast  04/01/2013   CLINICAL DATA:  Mid to low abdominal pain.  Bloating and diarrhea.  EXAM: CT ABDOMEN AND PELVIS WITH CONTRAST  TECHNIQUE: Multidetector CT imaging of the  abdomen and pelvis was performed using the standard protocol following bolus administration of intravenous contrast.  CONTRAST:  54mL OMNIPAQUE IOHEXOL 300 MG/ML SOLN, 187mL OMNIPAQUE IOHEXOL 300 MG/ML SOLN  COMPARISON:  CT ABD/PELVIS W CM dated 07/03/2012  FINDINGS: Minimal subsegmental atelectasis is seen in the dependent lung bases.  No focal abnormality in the liver or spleen. The stomach, duodenum, pancreas, gallbladder, and adrenal glands have normal imaging features. No focal abnormality is seen in either kidney. There is no hydronephrosis.  Atherosclerotic calcification is noted in the wall of the abdominal aorta without aneurysm. No lymphadenopathy in the abdomen. No free intraperitoneal fluid.  Imaging through the pelvis shows no free intraperitoneal fluid. No pelvic sidewall lymphadenopathy. Bladder is normal in appearance. Prostate gland is unremarkable.  Diverticular changes are seen in the left colon without evidence for diverticulitis. Terminal ileum is normal. The retrocecal appendix is normal.  Bone windows reveal no worrisome lytic or sclerotic osseous lesions.  IMPRESSION: No acute findings in the abdomen or pelvis.  Left colonic diverticulosis without diverticulitis.   Electronically Signed   By: Misty Stanley M.D.   On: 04/01/2013 18:43     EKG Interpretation None      MDM no acute abnormality on ct,  Pt has diverticulosis.   I suspect pt has had diverticulitis and is responding to flagyl.  Pt counseled on results   Final diagnoses:  Abdominal pain  Diverticulosis    Pt advised to follow up with his GI doctor.   I will add cipro and percocet for pain.   Pt advised to continue flagyl.       [place], PA-C 04/01/13 2224

## 2013-04-01 NOTE — Telephone Encounter (Signed)
Called spoke with pt. He reports the bentyl and flagyl is not helping with diarrhea. He has ben eating yogurt and taking align. Going to the bathroom 2-3 times daily. Please advise SN thanks  Allergies  Allergen Reactions  . Crestor [Rosuvastatin Calcium]     Severe leg cramping  . Sulfa Antibiotics     rash

## 2013-04-01 NOTE — ED Notes (Signed)
Urinal at bedside- PO contrast given to pt.  Family at bedside.

## 2013-04-01 NOTE — ED Notes (Signed)
Unable to void

## 2013-04-02 NOTE — Telephone Encounter (Signed)
Patient states that he was seen in [place] in [facility] 04/01/2013 Dx with Diverticulosis--which causing a lot of his GI issues. Given 2 Rx at ED  Percocet 325mg  Sig: Take 2 tablets by mouth every 4 (four) hours as needed for severe pain.  Cipro 500mg  Sig: Take 1 tablet (500 mg total) by mouth 2 (two) times daily.  Patient states that he wanted to let SN know that he was seen and taken care of.

## 2013-04-02 NOTE — Telephone Encounter (Signed)
LMTCB

## 2013-04-02 NOTE — Telephone Encounter (Signed)
Pt returning call.Kurt Mcdonald ° °

## 2013-04-04 NOTE — ED Provider Notes (Signed)
Medical screening examination/treatment/procedure(s) were performed by non-physician practitioner and as supervising physician I was immediately available for consultation/collaboration.   EKG Interpretation None        Charles B. Sheldon, MD 04/04/13 0942 

## 2013-04-13 ENCOUNTER — Telehealth: Payer: Self-pay | Admitting: Pulmonary Disease

## 2013-04-13 NOTE — Telephone Encounter (Signed)
Spoke with pt. He scheduled appt to come in and see TP. Nothing further needed

## 2013-04-14 ENCOUNTER — Encounter: Payer: Self-pay | Admitting: Adult Health

## 2013-04-14 ENCOUNTER — Emergency Department (HOSPITAL_COMMUNITY)
Admission: EM | Admit: 2013-04-14 | Discharge: 2013-04-14 | Disposition: A | Discharge status: Home / Self Care | Payer: BC Managed Care – PPO | Attending: Emergency Medicine | Admitting: Emergency Medicine

## 2013-04-14 ENCOUNTER — Ambulatory Visit (INDEPENDENT_AMBULATORY_CARE_PROVIDER_SITE_OTHER): Payer: BC Managed Care – PPO | Admitting: Adult Health

## 2013-04-14 ENCOUNTER — Emergency Department (HOSPITAL_COMMUNITY): Payer: BC Managed Care – PPO

## 2013-04-14 ENCOUNTER — Encounter (HOSPITAL_COMMUNITY): Payer: Self-pay | Admitting: Emergency Medicine

## 2013-04-14 VITALS — BP 86/60 | HR 122 | Temp 97.9°F | Ht 76.0 in | Wt 258.6 lb

## 2013-04-14 DIAGNOSIS — F411 Generalized anxiety disorder: Secondary | ICD-10-CM | POA: Insufficient documentation

## 2013-04-14 DIAGNOSIS — R109 Unspecified abdominal pain: Secondary | ICD-10-CM

## 2013-04-14 DIAGNOSIS — I951 Orthostatic hypotension: Secondary | ICD-10-CM

## 2013-04-14 DIAGNOSIS — K589 Irritable bowel syndrome without diarrhea: Secondary | ICD-10-CM | POA: Insufficient documentation

## 2013-04-14 DIAGNOSIS — F329 Major depressive disorder, single episode, unspecified: Secondary | ICD-10-CM | POA: Insufficient documentation

## 2013-04-14 DIAGNOSIS — Z8701 Personal history of pneumonia (recurrent): Secondary | ICD-10-CM | POA: Insufficient documentation

## 2013-04-14 DIAGNOSIS — E663 Overweight: Secondary | ICD-10-CM | POA: Insufficient documentation

## 2013-04-14 DIAGNOSIS — Z79899 Other long term (current) drug therapy: Secondary | ICD-10-CM | POA: Insufficient documentation

## 2013-04-14 DIAGNOSIS — G894 Chronic pain syndrome: Secondary | ICD-10-CM | POA: Insufficient documentation

## 2013-04-14 DIAGNOSIS — R197 Diarrhea, unspecified: Secondary | ICD-10-CM | POA: Insufficient documentation

## 2013-04-14 DIAGNOSIS — R05 Cough: Secondary | ICD-10-CM | POA: Insufficient documentation

## 2013-04-14 DIAGNOSIS — F3289 Other specified depressive episodes: Secondary | ICD-10-CM | POA: Insufficient documentation

## 2013-04-14 DIAGNOSIS — R059 Cough, unspecified: Secondary | ICD-10-CM

## 2013-04-14 DIAGNOSIS — Z792 Long term (current) use of antibiotics: Secondary | ICD-10-CM | POA: Insufficient documentation

## 2013-04-14 LAB — COMPREHENSIVE METABOLIC PANEL
ALBUMIN: 4 g/dL (ref 3.5–5.2)
ALT: 40 U/L (ref 0–53)
AST: 31 U/L (ref 0–37)
Alkaline Phosphatase: 57 U/L (ref 39–117)
BUN: 15 mg/dL (ref 6–23)
CO2: 24 mEq/L (ref 19–32)
CREATININE: 1.39 mg/dL — AB (ref 0.50–1.35)
Calcium: 9.5 mg/dL (ref 8.4–10.5)
Chloride: 102 mEq/L (ref 96–112)
GFR calc Af Amer: 68 mL/min — ABNORMAL LOW (ref >90)
GFR calc non Af Amer: 59 mL/min — ABNORMAL LOW (ref >90)
Glucose, Bld: 95 mg/dL (ref 70–99)
POTASSIUM: 4.3 meq/L (ref 3.7–5.3)
Sodium: 140 mEq/L (ref 137–147)
Total Bilirubin: 0.4 mg/dL (ref 0.3–1.2)
Total Protein: 7.3 g/dL (ref 6.0–8.3)

## 2013-04-14 LAB — URINALYSIS, ROUTINE W REFLEX MICROSCOPIC
BILIRUBIN URINE: NEGATIVE
GLUCOSE, UA: NEGATIVE mg/dL
HGB URINE DIPSTICK: NEGATIVE
Ketones, ur: NEGATIVE mg/dL
Leukocytes, UA: NEGATIVE
Nitrite: NEGATIVE
Protein, ur: NEGATIVE mg/dL
Specific Gravity, Urine: 1.022 (ref 1.005–1.030)
Urobilinogen, UA: 0.2 mg/dL (ref 0.0–1.0)
pH: 5.5 (ref 5.0–8.0)

## 2013-04-14 LAB — CBC WITH DIFFERENTIAL/PLATELET
BASOS PCT: 1 % (ref 0–1)
Basophils Absolute: 0.1 10*3/uL (ref 0.0–0.1)
Eosinophils Absolute: 0.2 10*3/uL (ref 0.0–0.7)
Eosinophils Relative: 3 % (ref 0–5)
HCT: 43.5 % (ref 39.0–52.0)
Hemoglobin: 15.4 g/dL (ref 13.0–17.0)
Lymphocytes Relative: 35 % (ref 12–46)
Lymphs Abs: 2.5 10*3/uL (ref 0.7–4.0)
MCH: 30.4 pg (ref 26.0–34.0)
MCHC: 35.4 g/dL (ref 30.0–36.0)
MCV: 86 fL (ref 78.0–100.0)
MONO ABS: 0.6 10*3/uL (ref 0.1–1.0)
Monocytes Relative: 8 % (ref 3–12)
NEUTROS ABS: 3.8 10*3/uL (ref 1.7–7.7)
Neutrophils Relative %: 53 % (ref 43–77)
PLATELETS: 419 10*3/uL — AB (ref 150–400)
RBC: 5.06 MIL/uL (ref 4.22–5.81)
RDW: 13.5 % (ref 11.5–15.5)
WBC: 7.1 10*3/uL (ref 4.0–10.5)

## 2013-04-14 LAB — LACTIC ACID, PLASMA: LACTIC ACID, VENOUS: 1.4 mmol/L (ref 0.5–2.2)

## 2013-04-14 LAB — LIPASE, BLOOD: LIPASE: 21 U/L (ref 11–59)

## 2013-04-14 MED ORDER — SODIUM CHLORIDE 0.9 % IV BOLUS (SEPSIS)
1000.0000 mL | Freq: Once | INTRAVENOUS | Status: AC
  Administered 2013-04-14: 1000 mL via INTRAVENOUS

## 2013-04-14 MED ORDER — ALBUTEROL SULFATE (2.5 MG/3ML) 0.083% IN NEBU
5.0000 mg | INHALATION_SOLUTION | Freq: Once | RESPIRATORY_TRACT | Status: AC
  Administered 2013-04-14: 5 mg via RESPIRATORY_TRACT
  Filled 2013-04-14: qty 6

## 2013-04-14 MED ORDER — ONDANSETRON HCL 4 MG/2ML IJ SOLN
4.0000 mg | Freq: Once | INTRAMUSCULAR | Status: AC
  Administered 2013-04-14: 4 mg via INTRAVENOUS
  Filled 2013-04-14: qty 2

## 2013-04-14 MED ORDER — HYDROMORPHONE HCL PF 1 MG/ML IJ SOLN
1.0000 mg | Freq: Once | INTRAMUSCULAR | Status: AC
  Administered 2013-04-14: 1 mg via INTRAVENOUS
  Filled 2013-04-14: qty 1

## 2013-04-14 NOTE — Discharge Instructions (Signed)
Rest. Drink plenty of fluids. Take tylenol/advil as need. Follow up with your primary care doctor in the next 1-2 days for recheck. Return to ER if worse, new symptoms, trouble breathing, worsening or severe flank or abdominal pain, fevers, severe diarrhea, fainting, other concern.  You were given pain medication in the ER - no driving for the next 6 hours.     Cough, Adult  A cough is a reflex that helps clear your throat and airways. It can help heal the body or may be a reaction to an irritated airway. A cough may only last 2 or 3 weeks (acute) or may last more than 8 weeks (chronic).  CAUSES Acute cough:  Viral or bacterial infections. Chronic cough:  Infections.  Allergies.  Asthma.  Post-nasal drip.  Smoking.  Heartburn or acid reflux.  Some medicines.  Chronic lung problems (COPD).  Cancer. SYMPTOMS   Cough.  Fever.  Chest pain.  Increased breathing rate.  High-pitched whistling sound when breathing (wheezing).  Colored mucus that you cough up (sputum). TREATMENT   A bacterial cough may be treated with antibiotic medicine.  A viral cough must run its course and will not respond to antibiotics.  Your caregiver may recommend other treatments if you have a chronic cough. HOME CARE INSTRUCTIONS   Only take over-the-counter or prescription medicines for pain, discomfort, or fever as directed by your caregiver. Use cough suppressants only as directed by your caregiver.  Use a cold steam vaporizer or humidifier in your bedroom or home to help loosen secretions.  Sleep in a semi-upright position if your cough is worse at night.  Rest as needed.  Stop smoking if you smoke. SEEK IMMEDIATE MEDICAL CARE IF:   You have pus in your sputum.  Your cough starts to worsen.  You cannot control your cough with suppressants and are losing sleep.  You begin coughing up blood.  You have difficulty breathing.  You develop pain which is getting worse or is  uncontrolled with medicine.  You have a fever. MAKE SURE YOU:   Understand these instructions.  Will watch your condition.  Will get help right away if you are not doing well or get worse. Document Released: 06/16/2010 Document Revised: 03/12/2011 Document Reviewed: 06/16/2010 [employer] Patient Information 2014 [place].    Flank Pain Flank pain refers to pain that is located on the side of the body between the upper abdomen and the back. The pain may occur over a short period of time (acute) or may be long-term or reoccurring (chronic). It may be mild or severe. Flank pain can be caused by many things. CAUSES  Some of the more common causes of flank pain include:  Muscle strains.   Muscle spasms.   A disease of your spine (vertebral disk disease).   A lung infection (pneumonia).   Fluid around your lungs (pulmonary edema).   A kidney infection.   Kidney stones.   A very painful skin rash caused by the chickenpox virus (shingles).   Gallbladder disease.  [place] care will depend on the cause of your pain. In general,  Rest as directed by your caregiver.  Drink enough fluids to keep your urine clear or pale yellow.  Only take over-the-counter or prescription medicines as directed by your caregiver. Some medicines may help relieve the pain.  Tell your caregiver about any changes in your pain.  Follow up with your caregiver as directed. SEEK IMMEDIATE MEDICAL CARE IF:   Your  pain is not controlled with medicine.   You have new or worsening symptoms.  Your pain increases.   You have abdominal pain.   You have shortness of breath.   You have persistent nausea or vomiting.   You have swelling in your abdomen.   You feel faint or pass out.   You have blood in your urine.  You have a fever or persistent symptoms for more than 2 3 days.  You have a fever and your symptoms suddenly get worse. MAKE SURE YOU:    Understand these instructions.  Will watch your condition.  Will get help right away if you are not doing well or get worse. Document Released: 02/08/2005 Document Revised: 09/12/2011 Document Reviewed: 08/02/2011 [facility] Patient Information 2014 [place].

## 2013-04-14 NOTE — Assessment & Plan Note (Signed)
Hypotension -suspect secondary to dehydration/hypovolemia from 4 week diarrhea Derrill Kay.  ?SIRS -will need further evaluation to make sure no acute abd issues   Plan  Transport to ER via EMS .

## 2013-04-14 NOTE — Assessment & Plan Note (Signed)
Persistent Abd Pain w/ Diarrhea x 4 weeks s/p flagyl and cipro tx regimen  Neg CT abd /pelvis except for diverticulosis on 04/01/13 .  ? Etiology -need to r/o acute abd issues , +-  possible C Diff w/ persistent diarrhea after abx.   Plan  Transfer to ER via EMS for further eval , tx. , IVF resusc.
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
## 2013-04-14 NOTE — ED Notes (Signed)
Provided pt with Apple Juice

## 2013-04-14 NOTE — ED Notes (Addendum)
Pt arrived via [facility]. Pt was at [facility] seeking treatment for productive cough since Tuesday and 9/10 flank pain. Staff report pt's BP was 86/60 and called EMS. EMS states BP was 110/80 upon arrival. Pt states he feels dizzy and lightheaded upon standing. Pt A&O.

## 2013-04-14 NOTE — Assessment & Plan Note (Signed)
Hx of recurrent PNA  Persistent cough s/p levaquin  Needs cxr to r/o PNA   Plan  Transfer to ER .

## 2013-04-14 NOTE — ED Notes (Signed)
Bed: AU63 Expected date:  Expected time:  Means of arrival:  Comments: [place]

## 2013-04-14 NOTE — ED Provider Notes (Addendum)
CSN: [id number]     Arrival date & time 04/14/13  1330 History   First MD Initiated Contact with Patient 04/14/13 1338     Chief Complaint  Patient presents with  . Hypotension  . Flank Pain  . Cough     (Consider location/radiation/quality/duration/timing/severity/associated sxs/prior Treatment) Patient is a 48 y.o. male presenting with flank pain and cough. The history is provided by the patient.  Flank Pain Pertinent negatives include no chest pain, no abdominal pain, no headaches and no shortness of breath.  Cough Associated symptoms: no chest pain, no fever, no headaches, no rash, no shortness of breath and no sore throat   pt c/o non productive cough in past month, increased this week. Denies sore throat or runny nose. No body aches. No fever or chills. Also states had been having LLQ pain in past month, states recently completed course abx for possible diverticulitis. No LLQ pain currently but states has also had right flank pain for past month few days. Dull, moderate, non radiating. Denies dysuria or hematuria. No hx kidney stones or uti. No abd distension. Sl decreased appetite. Pt also notes diarrhea in past couple weeks, currently having 1-3 loose bms per today, no severe diarrhea. No bloody diarrhea or stools. No known ill contacts or bad food ingestion. Pt went to pcp today w above symptoms, was told bp low and sent to ED.     Past Medical History  Diagnosis Date  . Dyspnea   . History of pneumonia   . Hypercholesteremia   . Overweight   . Diverticulosis of colon   . Irritable bowel syndrome (IBS)   . NAFLD (nonalcoholic fatty liver disease)   . Male hypogonadism   . LBP (low back pain)   . Chronic pain syndrome   . Anxiety   . Depression    Past Surgical History  Procedure Laterality Date  . Shoulder sugery  01/2010    to remove plate in shoulder  . Tooth extraction  11/2011   Family History  Problem Relation Age of Onset  . Diabetes     History   Substance Use Topics  . Smoking status: Never Smoker   . Smokeless tobacco: Never Used  . Alcohol Use: No    Review of Systems  Constitutional: Negative for fever.  HENT: Negative for sore throat.   Eyes: Negative for redness.  Respiratory: Positive for cough. Negative for shortness of breath.   Cardiovascular: Negative for chest pain and leg swelling.  Gastrointestinal: Positive for diarrhea. Negative for vomiting, abdominal pain and blood in stool.  Genitourinary: Positive for flank pain. Negative for dysuria and hematuria.  Musculoskeletal: Negative for back pain and neck pain.  Skin: Negative for rash.  Neurological: Negative for syncope, weakness, numbness and headaches.  Hematological: Does not bruise/bleed easily.  Psychiatric/Behavioral: Negative for confusion.      Allergies  Crestor and Sulfa antibiotics  Home Medications   Prior to Admission medications   Medication Sig Start Date End Date Taking? Authorizing Provider  ciprofloxacin (CIPRO) 500 MG tablet Take 500 mg by mouth 2 (two) times daily. Last dose was on 04/12/2013 04/01/13  Yes Historical Provider, MD  clonazePAM (KLONOPIN) 1 MG tablet TAKE 1 TABLET BY MOUTH TWICE A DAY AS NEEDED 03/26/13  Yes Noralee Space, MD  dextromethorphan-guaiFENesin [facility] DM) 30-600 MG per 12 hr tablet Take 1-2 tablets by mouth 2 (two) times daily as needed for cough.   Yes Historical Provider, MD  dicyclomine (BENTYL) 20 MG  tablet Take one three times daily as needed for abdominal cramping 03/26/13  Yes Noralee Space, MD  DULoxetine (CYMBALTA) 30 MG capsule TAKE ONE CAPSULE BY MOUTH TWICE A DAY 03/26/13  Yes Noralee Space, MD  HYDROcodone-acetaminophen (NORCO/VICODIN) 5-325 MG per tablet Take 1 tablet by mouth 2 (two) times daily as needed for pain.   Yes Historical Provider, MD  oxyCODONE-acetaminophen (PERCOCET/ROXICET) 5-325 MG per tablet Take 2 tablets by mouth every 4 (four) hours as needed for severe pain. 04/01/13  Yes Leslie K  Sofia, PA-C   BP 128/80  Pulse 82  Resp 18  SpO2 93% Physical Exam  Nursing note and vitals reviewed. Constitutional: He is oriented to person, place, and time. He appears well-developed and well-nourished. No distress.  HENT:  Head: Atraumatic.  Nose: Nose normal.  Mouth/Throat: Oropharynx is clear and moist.  Eyes: Conjunctivae are normal. Pupils are equal, round, and reactive to light. No scleral icterus.  Neck: Normal range of motion. Neck supple. No tracheal deviation present.  No stiffness or rigidity  Cardiovascular: Normal rate, regular rhythm, normal heart sounds and intact distal pulses.  Exam reveals no gallop and no friction rub.   No murmur heard. Pulmonary/Chest: Effort normal. No accessory muscle usage. No respiratory distress. He has wheezes.  Abdominal: Soft. Bowel sounds are normal. He exhibits no distension and no mass. There is no tenderness. There is no rebound and no guarding.  Genitourinary:  No cva tenderness  Musculoskeletal: Normal range of motion. He exhibits no edema and no tenderness.  Lymphadenopathy:    He has no cervical adenopathy.  Neurological: He is alert and oriented to person, place, and time.  Skin: Skin is warm and dry. No rash noted. He is not diaphoretic.  Psychiatric: He has a normal mood and affect.    ED Course  Procedures (including critical care time)   Results for orders placed during the hospital encounter of 04/14/13  CBC WITH DIFFERENTIAL      Result Value Ref Range   WBC 7.1  4.0 - 10.5 K/uL   RBC 5.06  4.22 - 5.81 MIL/uL   Hemoglobin 15.4  13.0 - 17.0 g/dL   HCT 43.5  39.0 - 52.0 %   MCV 86.0  78.0 - 100.0 fL   MCH 30.4  26.0 - 34.0 pg   MCHC 35.4  30.0 - 36.0 g/dL   RDW 13.5  11.5 - 15.5 %   Platelets 419 (*) 150 - 400 K/uL   Neutrophils Relative % 53  43 - 77 %   Neutro Abs 3.8  1.7 - 7.7 K/uL   Lymphocytes Relative 35  12 - 46 %   Lymphs Abs 2.5  0.7 - 4.0 K/uL   Monocytes Relative 8  3 - 12 %   Monocytes  Absolute 0.6  0.1 - 1.0 K/uL   Eosinophils Relative 3  0 - 5 %   Eosinophils Absolute 0.2  0.0 - 0.7 K/uL   Basophils Relative 1  0 - 1 %   Basophils Absolute 0.1  0.0 - 0.1 K/uL  COMPREHENSIVE METABOLIC PANEL      Result Value Ref Range   Sodium 140  137 - 147 mEq/L   Potassium 4.3  3.7 - 5.3 mEq/L   Chloride 102  96 - 112 mEq/L   CO2 24  19 - 32 mEq/L   Glucose, Bld 95  70 - 99 mg/dL   BUN 15  6 - 23 mg/dL   Creatinine, Ser  1.39 (*) 0.50 - 1.35 mg/dL   Calcium 9.5  8.4 - 10.5 mg/dL   Total Protein 7.3  6.0 - 8.3 g/dL   Albumin 4.0  3.5 - 5.2 g/dL   AST 31  0 - 37 U/L   ALT 40  0 - 53 U/L   Alkaline Phosphatase 57  39 - 117 U/L   Total Bilirubin 0.4  0.3 - 1.2 mg/dL   GFR calc non Af Amer 59 (*) >90 mL/min   GFR calc Af Amer 68 (*) >90 mL/min  URINALYSIS, ROUTINE W REFLEX MICROSCOPIC      Result Value Ref Range   Color, Urine YELLOW  YELLOW   APPearance CLEAR  CLEAR   Specific Gravity, Urine 1.022  1.005 - 1.030   pH 5.5  5.0 - 8.0   Glucose, UA NEGATIVE  NEGATIVE mg/dL   Hgb urine dipstick NEGATIVE  NEGATIVE   Bilirubin Urine NEGATIVE  NEGATIVE   Ketones, ur NEGATIVE  NEGATIVE mg/dL   Protein, ur NEGATIVE  NEGATIVE mg/dL   Urobilinogen, UA 0.2  0.0 - 1.0 mg/dL   Nitrite NEGATIVE  NEGATIVE   Leukocytes, UA NEGATIVE  NEGATIVE  LACTIC ACID, PLASMA      Result Value Ref Range   Lactic Acid, Venous 1.4  0.5 - 2.2 mmol/L  LIPASE, BLOOD      Result Value Ref Range   Lipase 21  11 - 59 U/L   Dg Chest 2 View  04/14/2013   CLINICAL DATA:  One month history of productive cough and chest pain  EXAM: CHEST  2 VIEW  COMPARISON:  Prior chest x-ray 04/05/2010  FINDINGS: The lungs are clear and negative for focal airspace consolidation, pulmonary edema or suspicious pulmonary nodule. No pleural effusion or pneumothorax. Cardiac and mediastinal contours are within normal limits. No acute fracture or lytic or blastic osseous lesions. The visualized upper abdominal bowel gas pattern is  unremarkable.  IMPRESSION: No active cardiopulmonary disease.   Electronically Signed   By: Jacqulynn Cadet M.D.   On: 04/14/2013 14:13   Ct Abdomen Pelvis W Contrast  04/01/2013   CLINICAL DATA:  Mid to low abdominal pain.  Bloating and diarrhea.  EXAM: CT ABDOMEN AND PELVIS WITH CONTRAST  TECHNIQUE: Multidetector CT imaging of the abdomen and pelvis was performed using the standard protocol following bolus administration of intravenous contrast.  CONTRAST:  86mL OMNIPAQUE IOHEXOL 300 MG/ML SOLN, 142mL OMNIPAQUE IOHEXOL 300 MG/ML SOLN  COMPARISON:  CT ABD/PELVIS W CM dated 07/03/2012  FINDINGS: Minimal subsegmental atelectasis is seen in the dependent lung bases.  No focal abnormality in the liver or spleen. The stomach, duodenum, pancreas, gallbladder, and adrenal glands have normal imaging features. No focal abnormality is seen in either kidney. There is no hydronephrosis.  Atherosclerotic calcification is noted in the wall of the abdominal aorta without aneurysm. No lymphadenopathy in the abdomen. No free intraperitoneal fluid.  Imaging through the pelvis shows no free intraperitoneal fluid. No pelvic sidewall lymphadenopathy. Bladder is normal in appearance. Prostate gland is unremarkable.  Diverticular changes are seen in the left colon without evidence for diverticulitis. Terminal ileum is normal. The retrocecal appendix is normal.  Bone windows reveal no worrisome lytic or sclerotic osseous lesions.  IMPRESSION: No acute findings in the abdomen or pelvis.  Left colonic diverticulosis without diverticulitis.   Electronically Signed   By: Misty Stanley M.D.   On: 04/01/2013 18:43        MDM  bp reported low at [facility],  on ems arrival to pick up pt, bp 110/.    Iv ns bolus. Labs. Cxr.  Reviewed nursing notes and prior charts for additional history.   On review prior charts, pt with ct abd pelvis within past 2 weeks negative acute.  Pt requests pain med for right post flank/right back pain. States  he has a ride, does not have to drive. Dilaudid 1 mg iv.  Spine nt. abd soft nt. ua pending.   Multiple bps in ED remain normal. Pt tolerating po fluids.  Additional ivf.   Awaiting labs.  Recheck labs unremarkable. Pt comfortable. abd soft nt. bp normal, hr 76, taking po well, no diarrhea while in ED - pt appears stable for d/c.        Mirna Mires, MD 04/14/13 [phone]

## 2013-04-27 ENCOUNTER — Encounter: Payer: Self-pay | Admitting: Pulmonary Disease

## 2013-05-16 ENCOUNTER — Other Ambulatory Visit: Payer: Self-pay | Admitting: Pulmonary Disease

## 2013-05-20 ENCOUNTER — Encounter: Payer: Self-pay | Admitting: Physician Assistant

## 2013-05-20 ENCOUNTER — Ambulatory Visit (INDEPENDENT_AMBULATORY_CARE_PROVIDER_SITE_OTHER): Payer: BC Managed Care – PPO | Admitting: Physician Assistant

## 2013-05-20 VITALS — BP 118/82 | HR 98 | Temp 98.2°F | Resp 16 | Ht 76.0 in | Wt 256.0 lb

## 2013-05-20 DIAGNOSIS — K5790 Diverticulosis of intestine, part unspecified, without perforation or abscess without bleeding: Secondary | ICD-10-CM

## 2013-05-20 DIAGNOSIS — M545 Low back pain, unspecified: Secondary | ICD-10-CM

## 2013-05-20 DIAGNOSIS — K5730 Diverticulosis of large intestine without perforation or abscess without bleeding: Secondary | ICD-10-CM

## 2013-05-20 DIAGNOSIS — F341 Dysthymic disorder: Secondary | ICD-10-CM

## 2013-05-20 DIAGNOSIS — K760 Fatty (change of) liver, not elsewhere classified: Secondary | ICD-10-CM

## 2013-05-20 DIAGNOSIS — E291 Testicular hypofunction: Secondary | ICD-10-CM

## 2013-05-20 DIAGNOSIS — Z23 Encounter for immunization: Secondary | ICD-10-CM

## 2013-05-20 DIAGNOSIS — K589 Irritable bowel syndrome without diarrhea: Secondary | ICD-10-CM

## 2013-05-20 DIAGNOSIS — Z0000 Encounter for general adult medical examination without abnormal findings: Secondary | ICD-10-CM

## 2013-05-20 DIAGNOSIS — E7800 Pure hypercholesterolemia, unspecified: Secondary | ICD-10-CM

## 2013-05-20 DIAGNOSIS — K7689 Other specified diseases of liver: Secondary | ICD-10-CM

## 2013-05-20 DIAGNOSIS — F329 Major depressive disorder, single episode, unspecified: Secondary | ICD-10-CM

## 2013-05-20 DIAGNOSIS — F32A Depression, unspecified: Secondary | ICD-10-CM

## 2013-05-20 DIAGNOSIS — F419 Anxiety disorder, unspecified: Secondary | ICD-10-CM

## 2013-05-20 MED ORDER — BUPROPION HCL 75 MG PO TABS: ORAL_TABLET | ORAL | Status: DC

## 2013-05-20 NOTE — Patient Instructions (Signed)
Please return to office one morning fasting for lab work.  I will call you with your results.  Please continue medications as directed.  Start wellbutrin, taking as prescribed.  We will follow-up in 1 month.  We will have you back sooner if indicated by your blood work results.    Preventive Care for Adults, Male A healthy lifestyle and preventive care can promote health and wellness. Preventive health guidelines for men include the following key practices:  A routine yearly physical is a good way to check with your health care provider about your health and preventative screening. It is a chance to share any concerns and updates on your health and to receive a thorough exam.  Visit your dentist for a routine exam and preventative care every 6 months. Brush your teeth twice a day and floss once a day. Good oral hygiene prevents tooth decay and gum disease.  The frequency of eye exams is based on your age, health, family medical history, use of contact lenses, and other factors. Follow your health care provider's recommendations for frequency of eye exams.  Eat a healthy diet. Foods such as vegetables, fruits, whole grains, low-fat dairy products, and lean protein foods contain the nutrients you need without too many calories. Decrease your intake of foods high in solid fats, added sugars, and salt. Eat the right amount of calories for you.Get information about a proper diet from your health care provider, if necessary.  Regular physical exercise is one of the most important things you can do for your health. Most adults should get at least 150 minutes of moderate-intensity exercise (any activity that increases your heart rate and causes you to sweat) each week. In addition, most adults need muscle-strengthening exercises on 2 or more days a week.  Maintain a healthy weight. The body mass index (BMI) is a screening tool to identify possible weight problems. It provides an estimate of body fat based on  height and weight. Your health care provider can find your BMI and can help you achieve or maintain a healthy weight.For adults 20 years and older:  A BMI below 18.5 is considered underweight.  A BMI of 18.5 to 24.9 is normal.  A BMI of 25 to 29.9 is considered overweight.  A BMI of 30 and above is considered obese.  Maintain normal blood lipids and cholesterol levels by exercising and minimizing your intake of saturated fat. Eat a balanced diet with plenty of fruit and vegetables. Blood tests for lipids and cholesterol should begin at age 31 and be repeated every 5 years. If your lipid or cholesterol levels are high, you are over 50, or you are at high risk for heart disease, you may need your cholesterol levels checked more frequently.Ongoing high lipid and cholesterol levels should be treated with medicines if diet and exercise are not working.  If you smoke, find out from your health care provider how to quit. If you do not use tobacco, do not start.  Lung cancer screening is recommended for adults aged 72 80 years who are at high risk for developing lung cancer because of a history of smoking. A yearly low-dose CT scan of the lungs is recommended for people who have at least a 30-pack-year history of smoking and are a current smoker or have quit within the past 15 years. A pack year of smoking is smoking an average of 1 pack of cigarettes a day for 1 year (for example: 1 pack a day for 30 years  or 2 packs a day for 15 years). Yearly screening should continue until the smoker has stopped smoking for at least 15 years. Yearly screening should be stopped for people who develop a health problem that would prevent them from having lung cancer treatment.  If you choose to drink alcohol, do not have more than 2 drinks per day. One drink is considered to be 12 ounces (355 mL) of beer, 5 ounces (148 mL) of wine, or 1.5 ounces (44 mL) of liquor.  Avoid use of street drugs. Do not share needles with  anyone. Ask for help if you need support or instructions about stopping the use of drugs.  High blood pressure causes heart disease and increases the risk of stroke. Your blood pressure should be checked at least every 1 2 years. Ongoing high blood pressure should be treated with medicines, if weight loss and exercise are not effective.  If you are 38 48 years old, ask your health care provider if you should take aspirin to prevent heart disease.  Diabetes screening involves taking a blood sample to check your fasting blood sugar level. This should be done once every 3 years, after age 59, if you are within normal weight and without risk factors for diabetes. Testing should be considered at a younger age or be carried out more frequently if you are overweight and have at least 1 risk factor for diabetes.  Colorectal cancer can be detected and often prevented. Most routine colorectal cancer screening begins at the age of 37 and continues through age 30. However, your health care provider may recommend screening at an earlier age if you have risk factors for colon cancer. On a yearly basis, your health care provider may provide home test kits to check for hidden blood in the stool. Use of a small camera at the end of a tube to directly examine the colon (sigmoidoscopy or colonoscopy) can detect the earliest forms of colorectal cancer. Talk to your health care provider about this at age 52, when routine screening begins. Direct exam of the colon should be repeated every 5 10 years through age 51, unless early forms of precancerous polyps or small growths are found.  People who are at an increased risk for hepatitis B should be screened for this virus. You are considered at high risk for hepatitis B if:  You were born in a country where hepatitis B occurs often. Talk with your health care provider about which countries are considered high-risk.  Your parents were born in a high-risk country and you have  not received a shot to protect against hepatitis B (hepatitis B vaccine).  You have HIV or AIDS.  You use needles to inject street drugs.  You live with, or have sex with, someone who has hepatitis B.  You are a man who has sex with other men (MSM).  You get hemodialysis treatment.  You take certain medicines for conditions such as cancer, organ transplantation, and autoimmune conditions.  Hepatitis C blood testing is recommended for all people born from [birth year] through 1965 and any individual with known risks for hepatitis C.  Practice safe sex. Use condoms and avoid high-risk sexual practices to reduce the spread of sexually transmitted infections (STIs). STIs include gonorrhea, chlamydia, syphilis, trichomonas, herpes, HPV, and human immunodeficiency virus (HIV). Herpes, HIV, and HPV are viral illnesses that have no cure. They can result in disability, cancer, and death.  A one-time screening for abdominal aortic aneurysm (AAA) and surgical repair of  large AAAs by ultrasound are recommended for men ages 50 to 12 years who are current or former smokers.  Healthy men should no longer receive prostate-specific antigen (PSA) blood tests as part of routine cancer screening. Talk with your health care provider about prostate cancer screening.  Testicular cancer screening is not recommended for adult males who have no symptoms. Screening includes self-exam, a health care provider exam, and other screening tests. Consult with your health care provider about any symptoms you have or any concerns you have about testicular cancer.  Use sunscreen. Apply sunscreen liberally and repeatedly throughout the day. You should seek shade when your shadow is shorter than you. Protect yourself by wearing long sleeves, pants, a wide-brimmed hat, and sunglasses year round, whenever you are outdoors.  Once a month, do a whole-body skin exam, using a mirror to look at the skin on your back. Tell your health care  provider about new moles, moles that have irregular borders, moles that are larger than a pencil eraser, or moles that have changed in shape or color.  Stay current with required vaccines (immunizations).  Influenza vaccine. All adults should be immunized every year.  Tetanus, diphtheria, and acellular pertussis (Td, Tdap) vaccine. An adult who has not previously received Tdap or who does not know his vaccine status should receive 1 dose of Tdap. This initial dose should be followed by tetanus and diphtheria toxoids (Td) booster doses every 10 years. Adults with an unknown or incomplete history of completing a 3-dose immunization series with Td-containing vaccines should begin or complete a primary immunization series including a Tdap dose. Adults should receive a Td booster every 10 years.  Varicella vaccine. An adult without evidence of immunity to varicella should receive 2 doses or a second dose if he has previously received 1 dose.  Human papillomavirus (HPV) vaccine. Males aged 12 21 years who have not received the vaccine previously should receive the 3-dose series. Males aged 55 26 years may be immunized. Immunization is recommended through the age of 24 years for any male who has sex with males and did not get any or all doses earlier. Immunization is recommended for any person with an immunocompromised condition through the age of 11 years if he did not get any or all doses earlier. During the 3-dose series, the second dose should be obtained 4 8 weeks after the first dose. The third dose should be obtained 24 weeks after the first dose and 16 weeks after the second dose.  Zoster vaccine. One dose is recommended for adults aged 51 years or older unless certain conditions are present.  Measles, mumps, and rubella (MMR) vaccine. Adults born before [birth year] generally are considered immune to measles and mumps. Adults born in [birth year] or later should have 1 or more doses of MMR vaccine unless there is a  contraindication to the vaccine or there is laboratory evidence of immunity to each of the three diseases. A routine second dose of MMR vaccine should be obtained at least 28 days after the first dose for students attending postsecondary schools, health care workers, or international travelers. People who received inactivated measles vaccine or an unknown type of measles vaccine during 1963 1967 should receive 2 doses of MMR vaccine. People who received inactivated mumps vaccine or an unknown type of mumps vaccine before 1979 and are at high risk for mumps infection should consider immunization with 2 doses of MMR vaccine. Unvaccinated health care workers born before 1957 who lack laboratory evidence of  measles, mumps, or rubella immunity or laboratory confirmation of disease should consider measles and mumps immunization with 2 doses of MMR vaccine or rubella immunization with 1 dose of MMR vaccine.  Pneumococcal 13-valent conjugate (PCV13) vaccine. When indicated, a person who is uncertain of his immunization history and has no record of immunization should receive the PCV13 vaccine. An adult aged 38 years or older who has certain medical conditions and has not been previously immunized should receive 1 dose of PCV13 vaccine. This PCV13 should be followed with a dose of pneumococcal polysaccharide (PPSV23) vaccine. The PPSV23 vaccine dose should be obtained at least 8 weeks after the dose of PCV13 vaccine. An adult aged 54 years or older who has certain medical conditions and previously received 1 or more doses of PPSV23 vaccine should receive 1 dose of PCV13. The PCV13 vaccine dose should be obtained 1 or more years after the last PPSV23 vaccine dose.  Pneumococcal polysaccharide (PPSV23) vaccine. When PCV13 is also indicated, PCV13 should be obtained first. All adults aged 42 years and older should be immunized. An adult younger than age 21 years who has certain medical conditions should be immunized. Any  person who resides in a nursing home or long-term care facility should be immunized. An adult smoker should be immunized. People with an immunocompromised condition and certain other conditions should receive both PCV13 and PPSV23 vaccines. People with human immunodeficiency virus (HIV) infection should be immunized as soon as possible after diagnosis. Immunization during chemotherapy or radiation therapy should be avoided. Routine use of PPSV23 vaccine is not recommended for American Indians, [place] Natives, or people younger than 65 years unless there are medical conditions that require PPSV23 vaccine. When indicated, people who have unknown immunization and have no record of immunization should receive PPSV23 vaccine. One-time revaccination 5 years after the first dose of PPSV23 is recommended for people aged 66 64 years who have chronic kidney failure, nephrotic syndrome, asplenia, or immunocompromised conditions. People who received 1 2 doses of PPSV23 before age 22 years should receive another dose of PPSV23 vaccine at age 15 years or later if at least 5 years have passed since the previous dose. Doses of PPSV23 are not needed for people immunized with PPSV23 at or after age 58 years.  Meningococcal vaccine. Adults with asplenia or persistent complement component deficiencies should receive 2 doses of quadrivalent meningococcal conjugate (MenACWY-D) vaccine. The doses should be obtained at least 2 months apart. Microbiologists working with certain meningococcal bacteria, [employer] recruits, people at risk during an outbreak, and people who travel to or live in countries with a high rate of meningitis should be immunized. A first-year college student up through age 36 years who is living in a residence hall should receive a dose if he did not receive a dose on or after his 16th birthday. Adults who have certain high-risk conditions should receive one or more doses of vaccine.  Hepatitis A vaccine. Adults who  wish to be protected from this disease, have certain high-risk conditions, work with hepatitis A-infected animals, work in hepatitis A research labs, or travel to or work in countries with a high rate of hepatitis A should be immunized. Adults who were previously unvaccinated and who anticipate close contact with an international adoptee during the first 60 days after arrival in the [place] from a country with a high rate of hepatitis A should be immunized.  Hepatitis B vaccine. Adults who wish to be protected from this disease, have certain high-risk conditions,  may be exposed to blood or other infectious body fluids, are household contacts or sex partners of hepatitis B positive people, are clients or workers in certain care facilities, or travel to or work in countries with a high rate of hepatitis B should be immunized.  Haemophilus influenzae type b (Hib) vaccine. A previously unvaccinated person with asplenia or sickle cell disease or having a scheduled splenectomy should receive 1 dose of Hib vaccine. Regardless of previous immunization, a recipient of a hematopoietic stem cell transplant should receive a 3-dose series 6 12 months after his successful transplant. Hib vaccine is not recommended for adults with HIV infection. Preventive Service / Frequency Ages 39 to 41  Blood pressure check.** / Every 1 to 2 years.  Lipid and cholesterol check.** / Every 5 years beginning at age 19.  Hepatitis C blood test.** / For any individual with known risks for hepatitis C.  Skin self-exam. / Monthly.  Influenza vaccine. / Every year.  Tetanus, diphtheria, and acellular pertussis (Tdap, Td) vaccine.** / Consult your health care provider. 1 dose of Td every 10 years.  Varicella vaccine.** / Consult your health care provider.  HPV vaccine. / 3 doses over 6 months, if 78 or younger.  Measles, mumps, rubella (MMR) vaccine.** / You need at least 1 dose of MMR if you were born in 1957 or later.  You may also need a second dose.  Pneumococcal 13-valent conjugate (PCV13) vaccine.** / Consult your health care provider.  Pneumococcal polysaccharide (PPSV23) vaccine.** / 1 to 2 doses if you smoke cigarettes or if you have certain conditions.  Meningococcal vaccine.** / 1 dose if you are age 67 to 54 years and a Market researcher living in a residence hall, or have one of several medical conditions. You may also need additional booster doses.  Hepatitis A vaccine.** / Consult your health care provider.  Hepatitis B vaccine.** / Consult your health care provider.  Haemophilus influenzae type b (Hib) vaccine.** / Consult your health care provider. Ages 36 to 71  Blood pressure check.** / Every 1 to 2 years.  Lipid and cholesterol check.** / Every 5 years beginning at age 61.  Lung cancer screening. / Every year if you are aged 26 80 years and have a 30-pack-year history of smoking and currently smoke or have quit within the past 15 years. Yearly screening is stopped once you have quit smoking for at least 15 years or develop a health problem that would prevent you from having lung cancer treatment.  Fecal occult blood test (FOBT) of stool. / Every year beginning at age 77 and continuing until age 3. You may not have to do this test if you get a colonoscopy every 10 years.  Flexible sigmoidoscopy** or colonoscopy.** / Every 5 years for a flexible sigmoidoscopy or every 10 years for a colonoscopy beginning at age 35 and continuing until age 49.  Hepatitis C blood test.** / For all people born from [birth year] through 1965 and any individual with known risks for hepatitis C.  Skin self-exam. / Monthly.  Influenza vaccine. / Every year.  Tetanus, diphtheria, and acellular pertussis (Tdap/Td) vaccine.** / Consult your health care provider. 1 dose of Td every 10 years.  Varicella vaccine.** / Consult your health care provider.  Zoster vaccine.** / 1 dose for adults aged 67 years or  older.  Measles, mumps, rubella (MMR) vaccine.** / You need at least 1 dose of MMR if you were born in 1957 or later. You may also  need a second dose.  Pneumococcal 13-valent conjugate (PCV13) vaccine.** / Consult your health care provider.  Pneumococcal polysaccharide (PPSV23) vaccine.** / 1 to 2 doses if you smoke cigarettes or if you have certain conditions.  Meningococcal vaccine.** / Consult your health care provider.  Hepatitis A vaccine.** / Consult your health care provider.  Hepatitis B vaccine.** / Consult your health care provider.  Haemophilus influenzae type b (Hib) vaccine.** / Consult your health care provider. Ages 50 and over  Blood pressure check.** / Every 1 to 2 years.  Lipid and cholesterol check.**/ Every 5 years beginning at age 81.  Lung cancer screening. / Every year if you are aged 61 80 years and have a 30-pack-year history of smoking and currently smoke or have quit within the past 15 years. Yearly screening is stopped once you have quit smoking for at least 15 years or develop a health problem that would prevent you from having lung cancer treatment.  Fecal occult blood test (FOBT) of stool. / Every year beginning at age 78 and continuing until age 34. You may not have to do this test if you get a colonoscopy every 10 years.  Flexible sigmoidoscopy** or colonoscopy.** / Every 5 years for a flexible sigmoidoscopy or every 10 years for a colonoscopy beginning at age 20 and continuing until age 69.  Hepatitis C blood test.** / For all people born from [birth year] through 1965 and any individual with known risks for hepatitis C.  Abdominal aortic aneurysm (AAA) screening.** / A one-time screening for ages 65 to 2 years who are current or former smokers.  Skin self-exam. / Monthly.  Influenza vaccine. / Every year.  Tetanus, diphtheria, and acellular pertussis (Tdap/Td) vaccine.** / 1 dose of Td every 10 years.  Varicella vaccine.** / Consult your health care  provider.  Zoster vaccine.** / 1 dose for adults aged 91 years or older.  Pneumococcal 13-valent conjugate (PCV13) vaccine.** / Consult your health care provider.  Pneumococcal polysaccharide (PPSV23) vaccine.** / 1 dose for all adults aged 76 years and older.  Meningococcal vaccine.** / Consult your health care provider.  Hepatitis A vaccine.** / Consult your health care provider.  Hepatitis B vaccine.** / Consult your health care provider.  Haemophilus influenzae type b (Hib) vaccine.** / Consult your health care provider. **Family history and personal history of risk and conditions may change your health care provider's recommendations. Document Released: 02/13/2001 Document Revised: 10/08/2012 Document Reviewed: 05/15/2010 [facility] Patient Information 2014 [place], [place].

## 2013-05-20 NOTE — Assessment & Plan Note (Signed)
Will repeat testosterone levels.

## 2013-05-20 NOTE — Assessment & Plan Note (Signed)
Medical history reviewed and updated.  TDaP given by nursing staff.  Other health Maintenance is up-to-date.  Patient to return fasting for labs.

## 2013-05-20 NOTE — Assessment & Plan Note (Signed)
Occasional cramping.  Continue bentyl as directed.  Continue daily probiotic.

## 2013-05-20 NOTE — Progress Notes (Signed)
Pre visit review using our clinic review tool, if applicable. No additional management support is needed unless otherwise documented below in the visit note/SLS  

## 2013-05-20 NOTE — Addendum Note (Signed)
Addended by: Rockwell [place] on: 05/20/2013 06:25 PM   Modules accepted: Orders

## 2013-05-20 NOTE — Assessment & Plan Note (Signed)
Will obtain repeat lipid panel.

## 2013-05-20 NOTE — Assessment & Plan Note (Signed)
Followed by Neurology. Continue current regimen 

## 2013-05-20 NOTE — Progress Notes (Signed)
Patient presents to clinic today to establish care.  Acute Concerns: Wishes to discuss increasing his Cymbalta.  Endorses depressed mood and increase in irritability.  Denies suicidal or homicidal ideation. Patient also relies on Cymbalta for chronic back pain.  Continues Klonopin at bedtime for sleep.    Chronic Issues: Diverticulosis -- asymptomatic at present.  Diagnosed in 2012.  IBS -- with alternating diarrhea and constipation.  Uses Bentyl prn for cramping.  Endorses episode of cramping last week that has resolved.    NAFLD -- diagnosed via imaging a few years ago.  Patient is not currently on any medications.  Tries to watch his diet. Will need to return fasting for lipid panel.  Chronic low back pain -- followed by Neurology.   Hypogonadism -- previous on Androgel several years ago.  Has worsening mood and energy levels.  Will need repeat Testosterone levels.  Health Maintenance: Dental -- Up-to-date Vision -- Up-to-date Immunizations -- Unsure of Tetanus immunization. Will be getting at today's visit. Colonoscopy -- Last in 2014; no abnormalities.  Past Medical History  Diagnosis Date  . Dyspnea   . History of pneumonia   . Hypercholesteremia   . Overweight   . Diverticulosis of colon   . Irritable bowel syndrome (IBS)   . NAFLD (nonalcoholic fatty liver disease)   . Male hypogonadism   . LBP (low back pain)   . Chronic pain syndrome   . Anxiety   . Depression   . Chicken pox    Past Surgical History  Procedure Laterality Date  . Shoulder sugery  01/2010    to remove plate in shoulder  . Tooth extraction  11/2011  . Wisdom tooth extraction     Current Outpatient Prescriptions on File Prior to Visit  Medication Sig Dispense Refill  . clonazePAM (KLONOPIN) 1 MG tablet TAKE 1 TABLET BY MOUTH TWICE A DAY AS NEEDED  60 tablet  1  . DULoxetine (CYMBALTA) 30 MG capsule TAKE ONE CAPSULE BY MOUTH TWICE A DAY  60 capsule  0  . HYDROcodone-acetaminophen (NORCO/VICODIN)  5-325 MG per tablet Take 1 tablet by mouth 2 (two) times daily as needed for pain.       No current facility-administered medications on file prior to visit.    Allergies  Allergen Reactions  . Crestor [Rosuvastatin Calcium]     Severe leg cramping  . Sulfa Antibiotics     rash    Family History  Problem Relation Age of Onset  . Diabetes Maternal Grandfather   . Healthy Mother     Living  . Pneumonia Father 89    Deceased  . Bone cancer Paternal Grandfather   . Healthy Brother   . Healthy Son     x1  . Healthy Daughter     x2    History   Social History  . Marital Status: Married    Spouse Name: Malachy Mood    Number of Children: 3  . Years of Education: N/A   Occupational History  . purchasing    Social History Main Topics  . Smoking status: Never Smoker   . Smokeless tobacco: Never Used  . Alcohol Use: No  . Drug Use: No  . Sexual Activity: Not on file   Other Topics Concern  . Not on file   Social History Narrative  . No narrative on file   Review of Systems  Constitutional: Negative for fever and weight loss.  HENT: Positive for tinnitus. Negative for ear discharge, ear pain  and hearing loss.   Eyes: Negative for blurred vision, double vision, photophobia and pain.  Respiratory: Negative for cough and shortness of breath.   Cardiovascular: Negative for chest pain, palpitations and orthopnea.  Gastrointestinal: Positive for diarrhea and constipation. Negative for heartburn, nausea, vomiting, abdominal pain, blood in stool and melena.  Genitourinary: Negative for dysuria.  Musculoskeletal: Positive for back pain. Negative for falls and myalgias.  Neurological: Negative for dizziness, tingling, tremors, seizures, loss of consciousness and headaches.  Endo/Heme/Allergies: Negative for environmental allergies.  Psychiatric/Behavioral: Positive for depression. Negative for suicidal ideas, hallucinations and substance abuse. The patient is not nervous/anxious  and does not have insomnia.    BP 118/82  Pulse 98  Temp(Src) 98.2 F (36.8 C) (Oral)  Resp 16  Ht 6' 4"  (1.93 m)  Wt 256 lb (116.121 kg)  BMI 31.17 kg/m2  SpO2 98%  Physical Exam  Vitals reviewed. Constitutional: He is oriented to person, place, and time and well-developed, well-nourished, and in no distress.  HENT:  Head: Normocephalic and atraumatic.  Right Ear: External ear normal.  Left Ear: External ear normal.  Nose: Nose normal.  Mouth/Throat: Oropharynx is clear and moist. No oropharyngeal exudate.  TM within normal limits bilaterally.  Eyes: Conjunctivae and EOM are normal. Pupils are equal, round, and reactive to light.  Neck: Neck supple. No thyromegaly present.  Cardiovascular: Normal rate, regular rhythm, normal heart sounds and intact distal pulses.   Pulmonary/Chest: Effort normal and breath sounds normal. No respiratory distress. He has no wheezes. He has no rales. He exhibits no tenderness.  Abdominal: Soft. Bowel sounds are normal. He exhibits no distension and no mass. There is no tenderness. There is no rebound and no guarding.  Lymphadenopathy:    He has no cervical adenopathy.  Neurological: He is alert and oriented to person, place, and time.  Skin: Skin is warm and dry. No rash noted.  Psychiatric: Affect normal.   Recent Results (from the past 2160 hour(s))  CBC WITH DIFFERENTIAL     Status: None   Collection Time    04/01/13  1:10 PM      Result Value Ref Range   WBC 6.7  4.0 - 10.5 K/uL   RBC 4.83  4.22 - 5.81 MIL/uL   Hemoglobin 15.0  13.0 - 17.0 g/dL   HCT 42.5  39.0 - 52.0 %   MCV 88.0  78.0 - 100.0 fL   MCH 31.1  26.0 - 34.0 pg   MCHC 35.3  30.0 - 36.0 g/dL   RDW 13.8  11.5 - 15.5 %   Platelets 367  150 - 400 K/uL   Neutrophils Relative % 53  43 - 77 %   Neutro Abs 3.6  1.7 - 7.7 K/uL   Lymphocytes Relative 35  12 - 46 %   Lymphs Abs 2.3  0.7 - 4.0 K/uL   Monocytes Relative 10  3 - 12 %   Monocytes Absolute 0.6  0.1 - 1.0 K/uL    Eosinophils Relative 2  0 - 5 %   Eosinophils Absolute 0.2  0.0 - 0.7 K/uL   Basophils Relative 1  0 - 1 %   Basophils Absolute 0.1  0.0 - 0.1 K/uL  COMPREHENSIVE METABOLIC PANEL     Status: Abnormal   Collection Time    04/01/13  1:10 PM      Result Value Ref Range   Sodium 142  137 - 147 mEq/L   Potassium 4.1  3.7 - 5.3 mEq/L  Chloride 102  96 - 112 mEq/L   CO2 26  19 - 32 mEq/L   Glucose, Bld 99  70 - 99 mg/dL   BUN 12  6 - 23 mg/dL   Creatinine, Ser 1.20  0.50 - 1.35 mg/dL   Calcium 10.0  8.4 - 10.5 mg/dL   Total Protein 7.5  6.0 - 8.3 g/dL   Albumin 4.0  3.5 - 5.2 g/dL   AST 27  0 - 37 U/L   ALT 42  0 - 53 U/L   Alkaline Phosphatase 59  39 - 117 U/L   Total Bilirubin 0.5  0.3 - 1.2 mg/dL   GFR calc non Af Amer 70 (*) >90 mL/min   GFR calc Af Amer 82 (*) >90 mL/min   Comment: (NOTE)     The eGFR has been calculated using the CKD EPI equation.     This calculation has not been validated in all clinical situations.     eGFR's persistently <90 mL/min signify possible Chronic Kidney     Disease.  URINALYSIS, ROUTINE W REFLEX MICROSCOPIC     Status: None   Collection Time    04/01/13  5:59 PM      Result Value Ref Range   Color, Urine YELLOW  YELLOW   APPearance CLEAR  CLEAR   Specific Gravity, Urine 1.006  1.005 - 1.030   pH 6.0  5.0 - 8.0   Glucose, UA NEGATIVE  NEGATIVE mg/dL   Hgb urine dipstick NEGATIVE  NEGATIVE   Bilirubin Urine NEGATIVE  NEGATIVE   Ketones, ur NEGATIVE  NEGATIVE mg/dL   Protein, ur NEGATIVE  NEGATIVE mg/dL   Urobilinogen, UA 0.2  0.0 - 1.0 mg/dL   Nitrite NEGATIVE  NEGATIVE   Leukocytes, UA NEGATIVE  NEGATIVE   Comment: MICROSCOPIC NOT DONE ON URINES WITH NEGATIVE PROTEIN, BLOOD, LEUKOCYTES, NITRITE, OR GLUCOSE <1000 mg/dL.  CBC WITH DIFFERENTIAL     Status: Abnormal   Collection Time    04/14/13  2:15 PM      Result Value Ref Range   WBC 7.1  4.0 - 10.5 K/uL   RBC 5.06  4.22 - 5.81 MIL/uL   Hemoglobin 15.4  13.0 - 17.0 g/dL   HCT 43.5   39.0 - 52.0 %   MCV 86.0  78.0 - 100.0 fL   MCH 30.4  26.0 - 34.0 pg   MCHC 35.4  30.0 - 36.0 g/dL   RDW 13.5  11.5 - 15.5 %   Platelets 419 (*) 150 - 400 K/uL   Neutrophils Relative % 53  43 - 77 %   Neutro Abs 3.8  1.7 - 7.7 K/uL   Lymphocytes Relative 35  12 - 46 %   Lymphs Abs 2.5  0.7 - 4.0 K/uL   Monocytes Relative 8  3 - 12 %   Monocytes Absolute 0.6  0.1 - 1.0 K/uL   Eosinophils Relative 3  0 - 5 %   Eosinophils Absolute 0.2  0.0 - 0.7 K/uL   Basophils Relative 1  0 - 1 %   Basophils Absolute 0.1  0.0 - 0.1 K/uL  COMPREHENSIVE METABOLIC PANEL     Status: Abnormal   Collection Time    04/14/13  2:15 PM      Result Value Ref Range   Sodium 140  137 - 147 mEq/L   Potassium 4.3  3.7 - 5.3 mEq/L   Chloride 102  96 - 112 mEq/L   CO2 24  19 -  32 mEq/L   Glucose, Bld 95  70 - 99 mg/dL   BUN 15  6 - 23 mg/dL   Creatinine, Ser 1.39 (*) 0.50 - 1.35 mg/dL   Calcium 9.5  8.4 - 10.5 mg/dL   Total Protein 7.3  6.0 - 8.3 g/dL   Albumin 4.0  3.5 - 5.2 g/dL   AST 31  0 - 37 U/L   ALT 40  0 - 53 U/L   Alkaline Phosphatase 57  39 - 117 U/L   Total Bilirubin 0.4  0.3 - 1.2 mg/dL   GFR calc non Af Amer 59 (*) >90 mL/min   GFR calc Af Amer 68 (*) >90 mL/min   Comment: (NOTE)     The eGFR has been calculated using the CKD EPI equation.     This calculation has not been validated in all clinical situations.     eGFR's persistently <90 mL/min signify possible Chronic Kidney     Disease.  LACTIC ACID, PLASMA     Status: None   Collection Time    04/14/13  2:15 PM      Result Value Ref Range   Lactic Acid, Venous 1.4  0.5 - 2.2 mmol/L  LIPASE, BLOOD     Status: None   Collection Time    04/14/13  2:15 PM      Result Value Ref Range   Lipase 21  11 - 59 U/L  URINALYSIS, ROUTINE W REFLEX MICROSCOPIC     Status: None   Collection Time    04/14/13  3:23 PM      Result Value Ref Range   Color, Urine YELLOW  YELLOW   APPearance CLEAR  CLEAR   Specific Gravity, Urine 1.022  1.005 -  1.030   pH 5.5  5.0 - 8.0   Glucose, UA NEGATIVE  NEGATIVE mg/dL   Hgb urine dipstick NEGATIVE  NEGATIVE   Bilirubin Urine NEGATIVE  NEGATIVE   Ketones, ur NEGATIVE  NEGATIVE mg/dL   Protein, ur NEGATIVE  NEGATIVE mg/dL   Urobilinogen, UA 0.2  0.0 - 1.0 mg/dL   Nitrite NEGATIVE  NEGATIVE   Leukocytes, UA NEGATIVE  NEGATIVE   Comment: MICROSCOPIC NOT DONE ON URINES WITH NEGATIVE PROTEIN, BLOOD, LEUKOCYTES, NITRITE, OR GLUCOSE <1000 mg/dL.   Assessment/Plan: Diverticulosis Asymptomatic at present.  Discussed proper diet to help reduce chance of acute diverticulitis.  Continue daily probiotic.  IBS (irritable bowel syndrome) Occasional cramping.  Continue bentyl as directed.  Continue daily probiotic.  NAFLD (nonalcoholic fatty liver disease) Will recheck CMP and Lipid panel.  Will reinitiate therapy if indicated by results.  Hypogonadism male Will repeat testosterone levels.  LBP (low back pain) Followed by Neurology.  Continue current regimen.  Anxiety and depression Will continue current dose of Cymbalta.  Will start Wellbutrin 75 mg QD x 1 week.  Will then increase to 75 mg BID.  Will obtain TSH and Testosterone levels as they could be contributing to worsening of symptoms. Follow-up in 1 month.  Hypercholesteremia Will obtain repeat lipid panel.  Visit for preventive health examination Medical history reviewed and updated.  TDaP given by nursing staff.  Other health Maintenance is up-to-date.  Patient to return fasting for labs.

## 2013-05-20 NOTE — Assessment & Plan Note (Signed)
Will recheck CMP and Lipid panel.  Will reinitiate therapy if indicated by results.

## 2013-05-20 NOTE — Assessment & Plan Note (Signed)
Asymptomatic at present.  Discussed proper diet to help reduce chance of acute diverticulitis.  Continue daily probiotic.

## 2013-05-20 NOTE — Assessment & Plan Note (Signed)
Will continue current dose of Cymbalta.  Will start Wellbutrin 75 mg QD x 1 week.  Will then increase to 75 mg BID.  Will obtain TSH and Testosterone levels as they could be contributing to worsening of symptoms. Follow-up in 1 month.

## 2013-05-25 ENCOUNTER — Other Ambulatory Visit: Payer: Self-pay | Admitting: Pulmonary Disease

## 2013-06-01 ENCOUNTER — Other Ambulatory Visit: Payer: Self-pay | Admitting: Pulmonary Disease

## 2013-06-02 ENCOUNTER — Other Ambulatory Visit: Payer: Self-pay | Admitting: Physician Assistant

## 2013-06-03 NOTE — Telephone Encounter (Signed)
Rx request phoned to pharmacy/SLS  

## 2013-06-03 NOTE — Telephone Encounter (Signed)
eScribe request from [facility] for refill on Klonopin Last filled - 03.26.15, #60x1 Last AEX - 05.20.15 [New] Next AEX - 1-Mth Please Advise on refills/SLS

## 2013-06-03 NOTE — Telephone Encounter (Signed)
Ok to refill.  Same strength and dose.  Quantity 60 with 1 refill.

## 2013-06-04 ENCOUNTER — Telehealth: Payer: Self-pay | Admitting: Gastroenterology

## 2013-06-04 ENCOUNTER — Ambulatory Visit (INDEPENDENT_AMBULATORY_CARE_PROVIDER_SITE_OTHER): Payer: BC Managed Care – PPO | Admitting: Family Medicine

## 2013-06-04 ENCOUNTER — Encounter: Payer: Self-pay | Admitting: Family Medicine

## 2013-06-04 VITALS — BP 140/84 | HR 90 | Temp 98.4°F | Resp 16 | Wt 254.1 lb

## 2013-06-04 DIAGNOSIS — K5732 Diverticulitis of large intestine without perforation or abscess without bleeding: Secondary | ICD-10-CM

## 2013-06-04 DIAGNOSIS — R197 Diarrhea, unspecified: Secondary | ICD-10-CM | POA: Insufficient documentation

## 2013-06-04 HISTORY — DX: Diverticulitis of large intestine without perforation or abscess without bleeding: K57.32

## 2013-06-04 MED ORDER — METRONIDAZOLE 500 MG PO TABS: 500.0000 mg | ORAL_TABLET | Freq: Three times a day (TID) | ORAL | Status: DC

## 2013-06-04 MED ORDER — CIPROFLOXACIN HCL 500 MG PO TABS: 500.0000 mg | ORAL_TABLET | Freq: Two times a day (BID) | ORAL | Status: DC

## 2013-06-04 NOTE — Progress Notes (Signed)
Subjective:    Patient ID: Kurt Mcdonald, male    DOB: October 29, 1965, 48 y.o.   MRN: [id number]  Diarrhea    Diarrhea- pt reports this is an intermittent, recurring problem over the past 3 months.  Has been hospitalized twice- once the end of March and once in April.  Current sxs started within the last week but 'consistent x3 days'.  No fever.  + nausea, no vomiting.  No blood in stool.  Stools were green x5 days.  No known sick contacts.  No recent travel.  Pt recently started Wellbutrin on 5/20 but was having diarrhea prior to this.  Has not seen GI.  No relief w/ immodium.  Intermittent abd pain- cramping prior to [facility], 'almost passed out a couple of times it was so bad'.  + anorexia.  + dizziness w/ standing.   Review of Systems  Gastrointestinal: Positive for diarrhea.   For ROS see HPI     Objective:   Physical Exam  Vitals reviewed. Constitutional: He appears well-developed and well-nourished. No distress.  HENT:  Head: Normocephalic and atraumatic.  MMM  Neck: Neck supple.  Cardiovascular: Normal rate, regular rhythm, normal heart sounds and intact distal pulses.   Pulmonary/Chest: Effort normal and breath sounds normal. No respiratory distress. He has no wheezes. He has no rales.  Abdominal: Soft. Bowel sounds are normal. He exhibits no distension. There is tenderness (diffuse TTP but worse over LLQ). There is no rebound and no guarding.  Lymphadenopathy:    He has no cervical adenopathy.  Skin: Skin is warm and dry.          Assessment & Plan:

## 2013-06-04 NOTE — Assessment & Plan Note (Signed)
New to provider, recurrent issue for pt.  Due to TTP over LLQ will start Cipro and Flagyl.  Reviewed importance of clear liquid diet until pain improves and then to advance slowly.  Reviewed supportive care and red flags that should prompt return.  Pt expressed understanding and is in agreement w/ plan.

## 2013-06-04 NOTE — Telephone Encounter (Signed)
Patient is scheduled for an appt with Nicoletta Ba PA on 06/10/13 2;00

## 2013-06-04 NOTE — Progress Notes (Signed)
Pre visit review using our clinic review tool, if applicable. No additional management support is needed unless otherwise documented below in the visit note. 

## 2013-06-04 NOTE — Patient Instructions (Signed)
Follow up as needed If the diarrhea worsens or you start to have severe pain, dizziness, blood in stool- please call or go to ER We'll call you with your GI appt Start the Cipro and Flagyl as directed Drink plenty of fluids REST! Call with any questions or concerns Hang in there!!!

## 2013-06-04 NOTE — Assessment & Plan Note (Signed)
New to provider, recurrent problem for pt.  Has been hospitalized x2 in the last 3 months.  Unclear if this is related to pt's current diverticulitis picture.  Refer to GI for complete evaluation and tx.  Reviewed supportive care and red flags that should prompt return.  Pt expressed understanding and is in agreement w/ plan.

## 2013-06-05 ENCOUNTER — Encounter: Payer: Self-pay | Admitting: General Practice

## 2013-06-05 LAB — CBC WITH DIFFERENTIAL/PLATELET
Basophils Absolute: 0 10*3/uL (ref 0.0–0.1)
Basophils Relative: 0.3 % (ref 0.0–3.0)
EOS ABS: 0.1 10*3/uL (ref 0.0–0.7)
Eosinophils Relative: 1.4 % (ref 0.0–5.0)
HCT: 45.1 % (ref 39.0–52.0)
Hemoglobin: 15 g/dL (ref 13.0–17.0)
Lymphocytes Relative: 28.9 % (ref 12.0–46.0)
Lymphs Abs: 2.5 10*3/uL (ref 0.7–4.0)
MCHC: 33.3 g/dL (ref 30.0–36.0)
MCV: 91.3 fl (ref 78.0–100.0)
MONO ABS: 0.5 10*3/uL (ref 0.1–1.0)
Monocytes Relative: 5.6 % (ref 3.0–12.0)
NEUTROS PCT: 63.8 % (ref 43.0–77.0)
Neutro Abs: 5.5 10*3/uL (ref 1.4–7.7)
PLATELETS: 462 10*3/uL — AB (ref 150.0–400.0)
RBC: 4.94 Mil/uL (ref 4.22–5.81)
RDW: 14.1 % (ref 11.5–15.5)
WBC: 8.6 10*3/uL (ref 4.0–10.5)

## 2013-06-05 LAB — HEPATIC FUNCTION PANEL
ALT: 40 U/L (ref 0–53)
AST: 26 U/L (ref 0–37)
Albumin: 4.5 g/dL (ref 3.5–5.2)
Alkaline Phosphatase: 59 U/L (ref 39–117)
BILIRUBIN DIRECT: 0.1 mg/dL (ref 0.0–0.3)
BILIRUBIN TOTAL: 0.7 mg/dL (ref 0.2–1.2)
Total Protein: 7.6 g/dL (ref 6.0–8.3)

## 2013-06-05 LAB — BASIC METABOLIC PANEL
BUN: 13 mg/dL (ref 6–23)
CHLORIDE: 101 meq/L (ref 96–112)
CO2: 26 mEq/L (ref 19–32)
Calcium: 10.2 mg/dL (ref 8.4–10.5)
Creatinine, Ser: 1.4 mg/dL (ref 0.4–1.5)
GFR: 58.96 mL/min — ABNORMAL LOW (ref >60.00)
Glucose, Bld: 67 mg/dL — ABNORMAL LOW (ref 70–99)
Potassium: 3.6 mEq/L (ref 3.5–5.1)
Sodium: 138 mEq/L (ref 135–145)

## 2013-06-05 LAB — LIPASE: LIPASE: 13 U/L (ref 11.0–59.0)

## 2013-06-10 ENCOUNTER — Ambulatory Visit: Payer: BC Managed Care – PPO | Admitting: Physician Assistant

## 2013-06-22 ENCOUNTER — Other Ambulatory Visit: Payer: Self-pay | Admitting: Physician Assistant

## 2013-06-22 NOTE — Telephone Encounter (Signed)
Refill request for Wellbutrin Last filled by MD on - 05.20.15, #60x0 Last AEX - 05.20.15 Next AEX - 1 Month [appt scheduled for 06.24.15] Refill sent per [facility] refill protocol/SLS

## 2013-06-24 ENCOUNTER — Encounter: Payer: Self-pay | Admitting: Physician Assistant

## 2013-06-24 ENCOUNTER — Ambulatory Visit (INDEPENDENT_AMBULATORY_CARE_PROVIDER_SITE_OTHER): Payer: BC Managed Care – PPO | Admitting: Physician Assistant

## 2013-06-24 VITALS — BP 136/88 | HR 88 | Temp 98.2°F | Resp 18 | Ht 76.0 in | Wt 257.5 lb

## 2013-06-24 DIAGNOSIS — F341 Dysthymic disorder: Secondary | ICD-10-CM

## 2013-06-24 DIAGNOSIS — F32A Depression, unspecified: Secondary | ICD-10-CM

## 2013-06-24 DIAGNOSIS — R6882 Decreased libido: Secondary | ICD-10-CM | POA: Insufficient documentation

## 2013-06-24 DIAGNOSIS — F329 Major depressive disorder, single episode, unspecified: Secondary | ICD-10-CM

## 2013-06-24 DIAGNOSIS — F419 Anxiety disorder, unspecified: Principal | ICD-10-CM

## 2013-06-24 MED ORDER — BUPROPION HCL 100 MG PO TABS: 100.0000 mg | ORAL_TABLET | Freq: Two times a day (BID) | ORAL | Status: DC

## 2013-06-24 MED ORDER — DULOXETINE HCL 30 MG PO CPEP: ORAL_CAPSULE | ORAL | Status: DC

## 2013-06-24 NOTE — Progress Notes (Signed)
Patient presents to clinic today for follow-up of anxiety and depression.  Patient states symptoms are much improved with the addition of Wellbutrin twice daily. Denies depressed mood or anhedonia.  Denies SI/HI.  Denies panic attack.  Does endorse irritability still.    Patient also requesting testosterone level due to lack of libido.  Reminded patient that can also be a symptom of depression or side effect of medication.  Past Medical History  Diagnosis Date  . Dyspnea   . History of pneumonia   . Hypercholesteremia   . Overweight(278.02)   . Diverticulosis of colon   . Irritable bowel syndrome (IBS)   . NAFLD (nonalcoholic fatty liver disease)   . Male hypogonadism   . LBP (low back pain)   . Chronic pain syndrome   . Anxiety   . Depression   . Chicken pox   . Diverticulitis of colon without hemorrhage 06/04/2013    Current Outpatient Prescriptions on File Prior to Visit  Medication Sig Dispense Refill  . clonazePAM (KLONOPIN) 1 MG tablet TAKE 1 TABLET BY MOUTH TWICE A DAY AS NEEDED  60 tablet  1  . HYDROcodone-acetaminophen (NORCO/VICODIN) 5-325 MG per tablet Take 1 tablet by mouth 2 (two) times daily as needed for pain.       No current facility-administered medications on file prior to visit.    Allergies  Allergen Reactions  . Crestor [Rosuvastatin Calcium]     Severe leg cramping  . Sulfa Antibiotics     rash    Family History  Problem Relation Age of Onset  . Diabetes Maternal Grandfather   . Healthy Mother     Living  . Pneumonia Father 54    Deceased  . Bone cancer Paternal Grandfather   . Healthy Brother   . Healthy Son     x1  . Healthy Daughter     x2    History   Social History  . Marital Status: Married    Spouse Name: Malachy Mood    Number of Children: 3  . Years of Education: N/A   Occupational History  . purchasing    Social History Main Topics  . Smoking status: Never Smoker   . Smokeless tobacco: Never Used  . Alcohol Use: No  . Drug  Use: No  . Sexual Activity: None   Other Topics Concern  . None   Social History Narrative  . None   Review of Systems - See HPI.  All other ROS are negative.  BP 136/88  Pulse 88  Temp(Src) 98.2 F (36.8 C) (Oral)  Resp 18  Ht 6' 4"  (1.93 m)  Wt 257 lb 8 oz (116.801 kg)  BMI 31.36 kg/m2  SpO2 96%  Physical Exam  Vitals reviewed. Constitutional: He is oriented to person, place, and time and well-developed, well-nourished, and in no distress.  HENT:  Head: Normocephalic and atraumatic.  Eyes: Conjunctivae are normal.  Cardiovascular: Normal rate, regular rhythm, normal heart sounds and intact distal pulses.   Pulmonary/Chest: Effort normal and breath sounds normal. No respiratory distress. He has no wheezes. He has no rales. He exhibits no tenderness.  Neurological: He is alert and oriented to person, place, and time.  Skin: Skin is warm and dry. No rash noted.  Psychiatric: Affect normal.    Recent Results (from the past 2160 hour(s))  CBC WITH DIFFERENTIAL     Status: None   Collection Time    04/01/13  1:10 PM      Result Value  Ref Range   WBC 6.7  4.0 - 10.5 K/uL   RBC 4.83  4.22 - 5.81 MIL/uL   Hemoglobin 15.0  13.0 - 17.0 g/dL   HCT 42.5  39.0 - 52.0 %   MCV 88.0  78.0 - 100.0 fL   MCH 31.1  26.0 - 34.0 pg   MCHC 35.3  30.0 - 36.0 g/dL   RDW 13.8  11.5 - 15.5 %   Platelets 367  150 - 400 K/uL   Neutrophils Relative % 53  43 - 77 %   Neutro Abs 3.6  1.7 - 7.7 K/uL   Lymphocytes Relative 35  12 - 46 %   Lymphs Abs 2.3  0.7 - 4.0 K/uL   Monocytes Relative 10  3 - 12 %   Monocytes Absolute 0.6  0.1 - 1.0 K/uL   Eosinophils Relative 2  0 - 5 %   Eosinophils Absolute 0.2  0.0 - 0.7 K/uL   Basophils Relative 1  0 - 1 %   Basophils Absolute 0.1  0.0 - 0.1 K/uL  COMPREHENSIVE METABOLIC PANEL     Status: Abnormal   Collection Time    04/01/13  1:10 PM      Result Value Ref Range   Sodium 142  137 - 147 mEq/L   Potassium 4.1  3.7 - 5.3 mEq/L   Chloride 102   96 - 112 mEq/L   CO2 26  19 - 32 mEq/L   Glucose, Bld 99  70 - 99 mg/dL   BUN 12  6 - 23 mg/dL   Creatinine, Ser 1.20  0.50 - 1.35 mg/dL   Calcium 10.0  8.4 - 10.5 mg/dL   Total Protein 7.5  6.0 - 8.3 g/dL   Albumin 4.0  3.5 - 5.2 g/dL   AST 27  0 - 37 U/L   ALT 42  0 - 53 U/L   Alkaline Phosphatase 59  39 - 117 U/L   Total Bilirubin 0.5  0.3 - 1.2 mg/dL   GFR calc non Af Amer 70 (*) >90 mL/min   GFR calc Af Amer 82 (*) >90 mL/min   Comment: (NOTE)     The eGFR has been calculated using the CKD EPI equation.     This calculation has not been validated in all clinical situations.     eGFR's persistently <90 mL/min signify possible Chronic Kidney     Disease.  URINALYSIS, ROUTINE W REFLEX MICROSCOPIC     Status: None   Collection Time    04/01/13  5:59 PM      Result Value Ref Range   Color, Urine YELLOW  YELLOW   APPearance CLEAR  CLEAR   Specific Gravity, Urine 1.006  1.005 - 1.030   pH 6.0  5.0 - 8.0   Glucose, UA NEGATIVE  NEGATIVE mg/dL   Hgb urine dipstick NEGATIVE  NEGATIVE   Bilirubin Urine NEGATIVE  NEGATIVE   Ketones, ur NEGATIVE  NEGATIVE mg/dL   Protein, ur NEGATIVE  NEGATIVE mg/dL   Urobilinogen, UA 0.2  0.0 - 1.0 mg/dL   Nitrite NEGATIVE  NEGATIVE   Leukocytes, UA NEGATIVE  NEGATIVE   Comment: MICROSCOPIC NOT DONE ON URINES WITH NEGATIVE PROTEIN, BLOOD, LEUKOCYTES, NITRITE, OR GLUCOSE <1000 mg/dL.  CBC WITH DIFFERENTIAL     Status: Abnormal   Collection Time    04/14/13  2:15 PM      Result Value Ref Range   WBC 7.1  4.0 - 10.5 K/uL  RBC 5.06  4.22 - 5.81 MIL/uL   Hemoglobin 15.4  13.0 - 17.0 g/dL   HCT 43.5  39.0 - 52.0 %   MCV 86.0  78.0 - 100.0 fL   MCH 30.4  26.0 - 34.0 pg   MCHC 35.4  30.0 - 36.0 g/dL   RDW 13.5  11.5 - 15.5 %   Platelets 419 (*) 150 - 400 K/uL   Neutrophils Relative % 53  43 - 77 %   Neutro Abs 3.8  1.7 - 7.7 K/uL   Lymphocytes Relative 35  12 - 46 %   Lymphs Abs 2.5  0.7 - 4.0 K/uL   Monocytes Relative 8  3 - 12 %   Monocytes  Absolute 0.6  0.1 - 1.0 K/uL   Eosinophils Relative 3  0 - 5 %   Eosinophils Absolute 0.2  0.0 - 0.7 K/uL   Basophils Relative 1  0 - 1 %   Basophils Absolute 0.1  0.0 - 0.1 K/uL  COMPREHENSIVE METABOLIC PANEL     Status: Abnormal   Collection Time    04/14/13  2:15 PM      Result Value Ref Range   Sodium 140  137 - 147 mEq/L   Potassium 4.3  3.7 - 5.3 mEq/L   Chloride 102  96 - 112 mEq/L   CO2 24  19 - 32 mEq/L   Glucose, Bld 95  70 - 99 mg/dL   BUN 15  6 - 23 mg/dL   Creatinine, Ser 1.39 (*) 0.50 - 1.35 mg/dL   Calcium 9.5  8.4 - 10.5 mg/dL   Total Protein 7.3  6.0 - 8.3 g/dL   Albumin 4.0  3.5 - 5.2 g/dL   AST 31  0 - 37 U/L   ALT 40  0 - 53 U/L   Alkaline Phosphatase 57  39 - 117 U/L   Total Bilirubin 0.4  0.3 - 1.2 mg/dL   GFR calc non Af Amer 59 (*) >90 mL/min   GFR calc Af Amer 68 (*) >90 mL/min   Comment: (NOTE)     The eGFR has been calculated using the CKD EPI equation.     This calculation has not been validated in all clinical situations.     eGFR's persistently <90 mL/min signify possible Chronic Kidney     Disease.  LACTIC ACID, PLASMA     Status: None   Collection Time    04/14/13  2:15 PM      Result Value Ref Range   Lactic Acid, Venous 1.4  0.5 - 2.2 mmol/L  LIPASE, BLOOD     Status: None   Collection Time    04/14/13  2:15 PM      Result Value Ref Range   Lipase 21  11 - 59 U/L  URINALYSIS, ROUTINE W REFLEX MICROSCOPIC     Status: None   Collection Time    04/14/13  3:23 PM      Result Value Ref Range   Color, Urine YELLOW  YELLOW   APPearance CLEAR  CLEAR   Specific Gravity, Urine 1.022  1.005 - 1.030   pH 5.5  5.0 - 8.0   Glucose, UA NEGATIVE  NEGATIVE mg/dL   Hgb urine dipstick NEGATIVE  NEGATIVE   Bilirubin Urine NEGATIVE  NEGATIVE   Ketones, ur NEGATIVE  NEGATIVE mg/dL   Protein, ur NEGATIVE  NEGATIVE mg/dL   Urobilinogen, UA 0.2  0.0 - 1.0 mg/dL   Nitrite NEGATIVE  NEGATIVE  Leukocytes, UA NEGATIVE  NEGATIVE   Comment: MICROSCOPIC NOT  DONE ON URINES WITH NEGATIVE PROTEIN, BLOOD, LEUKOCYTES, NITRITE, OR GLUCOSE <1000 mg/dL.  BASIC METABOLIC PANEL     Status: Abnormal   Collection Time    06/04/13  1:23 PM      Result Value Ref Range   Sodium 138  135 - 145 mEq/L   Potassium 3.6  3.5 - 5.1 mEq/L   Chloride 101  96 - 112 mEq/L   CO2 26  19 - 32 mEq/L   Glucose, Bld 67 (*) 70 - 99 mg/dL   BUN 13  6 - 23 mg/dL   Creatinine, Ser 1.4  0.4 - 1.5 mg/dL   Calcium 10.2  8.4 - 10.5 mg/dL   GFR 58.96 (*) >60.00 mL/min  HEPATIC FUNCTION PANEL     Status: None   Collection Time    06/04/13  1:23 PM      Result Value Ref Range   Total Bilirubin 0.7  0.2 - 1.2 mg/dL   Bilirubin, Direct 0.1  0.0 - 0.3 mg/dL   Alkaline Phosphatase 59  39 - 117 U/L   AST 26  0 - 37 U/L   ALT 40  0 - 53 U/L   Total Protein 7.6  6.0 - 8.3 g/dL   Albumin 4.5  3.5 - 5.2 g/dL  CBC WITH DIFFERENTIAL     Status: Abnormal   Collection Time    06/04/13  1:23 PM      Result Value Ref Range   WBC 8.6  4.0 - 10.5 K/uL   RBC 4.94  4.22 - 5.81 Mil/uL   Hemoglobin 15.0  13.0 - 17.0 g/dL   HCT 45.1  39.0 - 52.0 %   MCV 91.3  78.0 - 100.0 fl   MCHC 33.3  30.0 - 36.0 g/dL   RDW 14.1  11.5 - 15.5 %   Platelets 462.0 (*) 150.0 - 400.0 K/uL   Neutrophils Relative % 63.8  43.0 - 77.0 %   Lymphocytes Relative 28.9  12.0 - 46.0 %   Monocytes Relative 5.6  3.0 - 12.0 %   Eosinophils Relative 1.4  0.0 - 5.0 %   Basophils Relative 0.3  0.0 - 3.0 %   Neutro Abs 5.5  1.4 - 7.7 K/uL   Lymphs Abs 2.5  0.7 - 4.0 K/uL   Monocytes Absolute 0.5  0.1 - 1.0 K/uL   Eosinophils Absolute 0.1  0.0 - 0.7 K/uL   Basophils Absolute 0.0  0.0 - 0.1 K/uL  LIPASE     Status: None   Collection Time    06/04/13  1:23 PM      Result Value Ref Range   Lipase 13.0  11.0 - 59.0 U/L    Assessment/Plan: Anxiety and depression Increase Wellbutrin to 100 mg BID.  Continue Cymbalta.  Follow-up in 4-6 weeks.  Loss of libido Will check testosterone level.

## 2013-06-24 NOTE — Patient Instructions (Signed)
Please continue Cymbalta as directed.  Begin taking the new dose of Wellbutrin 100 mg twice daily. Follow-up in 4-6 weeks so we can reassess your symptoms.  Return sooner if you need anything.

## 2013-06-24 NOTE — Progress Notes (Signed)
Pre visit review using our clinic review tool, if applicable. No additional management support is needed unless otherwise documented below in the visit note/SLS  

## 2013-06-24 NOTE — Assessment & Plan Note (Signed)
Increase Wellbutrin to 100 mg BID.  Continue Cymbalta.  Follow-up in 4-6 weeks.

## 2013-06-24 NOTE — Assessment & Plan Note (Signed)
Will check testosterone level 

## 2013-06-29 ENCOUNTER — Ambulatory Visit: Payer: BC Managed Care – PPO | Admitting: Physician Assistant

## 2013-07-16 ENCOUNTER — Other Ambulatory Visit: Payer: Self-pay | Admitting: Pulmonary Disease

## 2013-07-16 ENCOUNTER — Other Ambulatory Visit: Payer: Self-pay | Admitting: Physician Assistant

## 2013-07-17 ENCOUNTER — Other Ambulatory Visit: Payer: Self-pay | Admitting: Physician Assistant

## 2013-07-17 NOTE — Telephone Encounter (Signed)
Medication Detail      Disp Refills Start End     DULoxetine (CYMBALTA) 30 MG capsule 60 capsule 0 06/24/2013     Sig: TAKE ONE CAPSULE BY MOUTH TWICE A DAY    E-Prescribing Status: Receipt confirmed by pharmacy (06/24/2013 11:48 AM EDT)     Hinda Lenis Soon for refill request/SLS

## 2013-07-20 ENCOUNTER — Telehealth: Payer: Self-pay | Admitting: *Deleted

## 2013-07-20 DIAGNOSIS — F329 Major depressive disorder, single episode, unspecified: Secondary | ICD-10-CM

## 2013-07-20 DIAGNOSIS — F32A Depression, unspecified: Secondary | ICD-10-CM

## 2013-07-20 DIAGNOSIS — F419 Anxiety disorder, unspecified: Principal | ICD-10-CM

## 2013-07-20 MED ORDER — DULOXETINE HCL 30 MG PO CPEP: ORAL_CAPSULE | ORAL | Status: DC

## 2013-07-20 NOTE — Telephone Encounter (Signed)
Per pharmacy 07.20.15, Rx never received; Rx request to pharmacy/SLS

## 2013-07-24 ENCOUNTER — Ambulatory Visit: Payer: BC Managed Care – PPO | Admitting: Physician Assistant

## 2013-07-29 ENCOUNTER — Ambulatory Visit (INDEPENDENT_AMBULATORY_CARE_PROVIDER_SITE_OTHER): Payer: BC Managed Care – PPO | Admitting: Physician Assistant

## 2013-07-29 ENCOUNTER — Encounter: Payer: Self-pay | Admitting: Physician Assistant

## 2013-07-29 VITALS — BP 142/106 | HR 92 | Temp 97.9°F | Resp 16 | Ht 76.0 in | Wt 256.8 lb

## 2013-07-29 DIAGNOSIS — F419 Anxiety disorder, unspecified: Secondary | ICD-10-CM

## 2013-07-29 DIAGNOSIS — R6882 Decreased libido: Secondary | ICD-10-CM

## 2013-07-29 DIAGNOSIS — E291 Testicular hypofunction: Secondary | ICD-10-CM

## 2013-07-29 DIAGNOSIS — E785 Hyperlipidemia, unspecified: Secondary | ICD-10-CM

## 2013-07-29 DIAGNOSIS — F32A Depression, unspecified: Secondary | ICD-10-CM

## 2013-07-29 DIAGNOSIS — F329 Major depressive disorder, single episode, unspecified: Secondary | ICD-10-CM

## 2013-07-29 DIAGNOSIS — F341 Dysthymic disorder: Secondary | ICD-10-CM

## 2013-07-29 DIAGNOSIS — E7800 Pure hypercholesterolemia, unspecified: Secondary | ICD-10-CM

## 2013-07-29 LAB — LIPID PANEL
CHOL/HDL RATIO: 5.9 ratio
Cholesterol: 223 mg/dL — ABNORMAL HIGH (ref 0–200)
HDL: 38 mg/dL — AB (ref >39)
LDL CALC: 140 mg/dL — AB (ref 0–99)
TRIGLYCERIDES: 224 mg/dL — AB (ref <150)
VLDL: 45 mg/dL — ABNORMAL HIGH (ref 0–40)

## 2013-07-29 LAB — TSH: TSH: 1.224 u[IU]/mL (ref 0.350–4.500)

## 2013-07-29 MED ORDER — BUPROPION HCL 75 MG PO TABS: 75.0000 mg | ORAL_TABLET | Freq: Two times a day (BID) | ORAL | Status: DC

## 2013-07-29 MED ORDER — CLONAZEPAM 1 MG PO TABS: ORAL_TABLET | ORAL | Status: DC

## 2013-07-29 NOTE — Assessment & Plan Note (Signed)
Patient to get labs ordered at last visit.

## 2013-07-29 NOTE — Patient Instructions (Signed)
Please continue Cymbalta and Klonopin as directed.  We will decrease the Wellbutrin back to 75 mg twice daily.  Try this for 1 week.  If no improvement in symptoms, please give me a call.  Please obtain labs.  I will call you with your results.

## 2013-07-29 NOTE — Progress Notes (Signed)
Pre visit review using our clinic review tool, if applicable. No additional management support is needed unless otherwise documented below in the visit note/SLS  

## 2013-07-29 NOTE — Assessment & Plan Note (Signed)
Continue current regimen of Cymbalta and Klonopin.  Medications refilled.  Will decrease Wellbutrin to 75 mg daily as patient was happy with this dose.  Follow-up if no improvement in symptoms.

## 2013-07-29 NOTE — Assessment & Plan Note (Signed)
Will obtain fasting lipid panel 

## 2013-07-29 NOTE — Progress Notes (Signed)
Patient presents to clinic today for follow-up of anxiety.  Patient endorses worsening of symptoms with increased dose of Wellbutrin.  Has continued other medication as directed.  Denies new symptoms.  Still having loss of libido and fatigue.  Will be getting labs today that were previously ordered.  Past Medical History  Diagnosis Date  . Dyspnea   . History of pneumonia   . Hypercholesteremia   . Overweight(278.02)   . Diverticulosis of colon   . Irritable bowel syndrome (IBS)   . NAFLD (nonalcoholic fatty liver disease)   . Male hypogonadism   . LBP (low back pain)   . Chronic pain syndrome   . Anxiety   . Depression   . Chicken pox   . Diverticulitis of colon without hemorrhage 06/04/2013    Current Outpatient Prescriptions on File Prior to Visit  Medication Sig Dispense Refill  . DULoxetine (CYMBALTA) 30 MG capsule TAKE ONE CAPSULE BY MOUTH TWICE A DAY  60 capsule  0  . HYDROcodone-acetaminophen (NORCO/VICODIN) 5-325 MG per tablet Take 1 tablet by mouth 2 (two) times daily as needed for pain.       No current facility-administered medications on file prior to visit.    Allergies  Allergen Reactions  . Crestor [Rosuvastatin Calcium]     Severe leg cramping  . Sulfa Antibiotics     rash    Family History  Problem Relation Age of Onset  . Diabetes Maternal Grandfather   . Healthy Mother     Living  . Pneumonia Father 29    Deceased  . Bone cancer Paternal Grandfather   . Healthy Brother   . Healthy Son     x1  . Healthy Daughter     x2    History   Social History  . Marital Status: Married    Spouse Name: Malachy Mood    Number of Children: 3  . Years of Education: N/A   Occupational History  . purchasing    Social History Main Topics  . Smoking status: Never Smoker   . Smokeless tobacco: Never Used  . Alcohol Use: No  . Drug Use: No  . Sexual Activity: None   Other Topics Concern  . None   Social History Narrative  . None    Review of Systems -  See HPI.  All other ROS are negative.  BP 142/106  Pulse 92  Temp(Src) 97.9 F (36.6 C) (Oral)  Resp 16  Ht 6\' 4"  (1.93 m)  Wt 256 lb 12 oz (116.461 kg)  BMI 31.27 kg/m2  SpO2 99%  Physical Exam  Vitals reviewed. Constitutional: He is oriented to person, place, and time and well-developed, well-nourished, and in no distress.  HENT:  Head: Normocephalic and atraumatic.  Eyes: Conjunctivae are normal. Pupils are equal, round, and reactive to light.  Neck: Neck supple. No thyromegaly present.  Cardiovascular: Normal rate, regular rhythm, normal heart sounds and intact distal pulses.   Pulmonary/Chest: Effort normal and breath sounds normal. No respiratory distress. He has no wheezes. He has no rales. He exhibits no tenderness.  Neurological: He is alert and oriented to person, place, and time.  Skin: Skin is warm and dry. No rash noted.  Psychiatric:  Anxious affect    Recent Results (from the past 2160 hour(s))  BASIC METABOLIC PANEL     Status: Abnormal   Collection Time    06/04/13  1:23 PM      Result Value Ref Range   Sodium 138  135 - 145 mEq/L   Potassium 3.6  3.5 - 5.1 mEq/L   Chloride 101  96 - 112 mEq/L   CO2 26  19 - 32 mEq/L   Glucose, Bld 67 (*) 70 - 99 mg/dL   BUN 13  6 - 23 mg/dL   Creatinine, Ser 1.4  0.4 - 1.5 mg/dL   Calcium 10.2  8.4 - 10.5 mg/dL   GFR 58.96 (*) >60.00 mL/min  HEPATIC FUNCTION PANEL     Status: None   Collection Time    06/04/13  1:23 PM      Result Value Ref Range   Total Bilirubin 0.7  0.2 - 1.2 mg/dL   Bilirubin, Direct 0.1  0.0 - 0.3 mg/dL   Alkaline Phosphatase 59  39 - 117 U/L   AST 26  0 - 37 U/L   ALT 40  0 - 53 U/L   Total Protein 7.6  6.0 - 8.3 g/dL   Albumin 4.5  3.5 - 5.2 g/dL  CBC WITH DIFFERENTIAL     Status: Abnormal   Collection Time    06/04/13  1:23 PM      Result Value Ref Range   WBC 8.6  4.0 - 10.5 K/uL   RBC 4.94  4.22 - 5.81 Mil/uL   Hemoglobin 15.0  13.0 - 17.0 g/dL   HCT 45.1  39.0 - 52.0 %   MCV  91.3  78.0 - 100.0 fl   MCHC 33.3  30.0 - 36.0 g/dL   RDW 14.1  11.5 - 15.5 %   Platelets 462.0 (*) 150.0 - 400.0 K/uL   Neutrophils Relative % 63.8  43.0 - 77.0 %   Lymphocytes Relative 28.9  12.0 - 46.0 %   Monocytes Relative 5.6  3.0 - 12.0 %   Eosinophils Relative 1.4  0.0 - 5.0 %   Basophils Relative 0.3  0.0 - 3.0 %   Neutro Abs 5.5  1.4 - 7.7 K/uL   Lymphs Abs 2.5  0.7 - 4.0 K/uL   Monocytes Absolute 0.5  0.1 - 1.0 K/uL   Eosinophils Absolute 0.1  0.0 - 0.7 K/uL   Basophils Absolute 0.0  0.0 - 0.1 K/uL  LIPASE     Status: None   Collection Time    06/04/13  1:23 PM      Result Value Ref Range   Lipase 13.0  11.0 - 59.0 U/L   Assessment/Plan: Hypogonadism male Patient to get labs ordered at last visit.  Anxiety and depression Continue current regimen of Cymbalta and Klonopin.  Medications refilled.  Will decrease Wellbutrin to 75 mg daily as patient was happy with this dose.  Follow-up if no improvement in symptoms.  Hypercholesteremia Will obtain fasting lipid panel.  Loss of libido Will check testosterone level ordered at last visit.  Patient unable to get lab at that time.

## 2013-07-29 NOTE — Assessment & Plan Note (Signed)
Will check testosterone level ordered at last visit.  Patient unable to get lab at that time.

## 2013-07-30 LAB — TESTOSTERONE, FREE, TOTAL, SHBG
Sex Hormone Binding: 35 nmol/L (ref 13–71)
Testosterone, Free: 59 pg/mL (ref 47.0–244.0)
Testosterone-% Free: 1.9 % (ref 1.6–2.9)
Testosterone: 310 ng/dL (ref 300–890)

## 2013-08-17 ENCOUNTER — Telehealth: Payer: Self-pay

## 2013-08-17 NOTE — Telephone Encounter (Signed)
LMOM with contact name and number for return call RE: medication management and further provider instructions/SLS

## 2013-08-17 NOTE — Telephone Encounter (Signed)
Pt stated that " Dr. Hassell Done put him on Wellbutrin and it was increased and pt was experiencing more depression so he went back to regular amount and is experiencing headaches. Is there anything else pt can take?" LDM

## 2013-08-17 NOTE — Telephone Encounter (Signed)
We can wean off of the Wellbutrin and go up on the Cymbalta to 60 mg daily, if he would like.  That would be my suggestion. Let me know what he decides.

## 2013-08-20 NOTE — Telephone Encounter (Signed)
Patient returned phone call. Best # [phone]

## 2013-08-24 ENCOUNTER — Other Ambulatory Visit: Payer: Self-pay | Admitting: Physician Assistant

## 2013-08-24 NOTE — Telephone Encounter (Signed)
Medication Detail      Disp Refills Start End     clonazePAM (KLONOPIN) 1 MG tablet 60 tablet 1 07/29/2013     Sig: TAKE 1 TABLET BY MOUTH TWICE A DAY AS NEEDED    Class: [facility] [id number] - [place], [place] - [address] AT [place]   Rx request from [facility], pt was given Rx #60 w/1 refill on 07.29.15 to [facility], as requested at [facility]; pt informed, understood & agreed/SLS

## 2013-09-04 ENCOUNTER — Encounter: Payer: Self-pay | Admitting: Physician Assistant

## 2013-09-04 ENCOUNTER — Ambulatory Visit (INDEPENDENT_AMBULATORY_CARE_PROVIDER_SITE_OTHER): Payer: BC Managed Care – PPO | Admitting: Physician Assistant

## 2013-09-04 ENCOUNTER — Telehealth: Payer: Self-pay | Admitting: Physician Assistant

## 2013-09-04 VITALS — BP 125/90 | HR 108 | Temp 98.0°F | Wt 255.0 lb

## 2013-09-04 DIAGNOSIS — Z23 Encounter for immunization: Secondary | ICD-10-CM

## 2013-09-04 DIAGNOSIS — F341 Dysthymic disorder: Secondary | ICD-10-CM

## 2013-09-04 DIAGNOSIS — F419 Anxiety disorder, unspecified: Secondary | ICD-10-CM

## 2013-09-04 DIAGNOSIS — F32A Depression, unspecified: Secondary | ICD-10-CM

## 2013-09-04 DIAGNOSIS — G43019 Migraine without aura, intractable, without status migrainosus: Secondary | ICD-10-CM

## 2013-09-04 DIAGNOSIS — G43009 Migraine without aura, not intractable, without status migrainosus: Secondary | ICD-10-CM

## 2013-09-04 DIAGNOSIS — F329 Major depressive disorder, single episode, unspecified: Secondary | ICD-10-CM

## 2013-09-04 DIAGNOSIS — E785 Hyperlipidemia, unspecified: Secondary | ICD-10-CM

## 2013-09-04 MED ORDER — SUMATRIPTAN SUCCINATE 50 MG PO TABS: 50.0000 mg | ORAL_TABLET | Freq: Once | ORAL | Status: DC

## 2013-09-04 MED ORDER — ATORVASTATIN CALCIUM 10 MG PO TABS: 10.0000 mg | ORAL_TABLET | Freq: Every day | ORAL | Status: DC

## 2013-09-04 MED ORDER — KETOROLAC TROMETHAMINE 60 MG/2ML IM SOLN
60.0000 mg | Freq: Once | INTRAMUSCULAR | Status: AC
  Administered 2013-09-04: 60 mg via INTRAMUSCULAR

## 2013-09-04 MED ORDER — DULOXETINE HCL 60 MG PO CPEP: 60.0000 mg | ORAL_CAPSULE | Freq: Two times a day (BID) | ORAL | Status: DC

## 2013-09-04 NOTE — Assessment & Plan Note (Signed)
Will hold off on Wellbutrin.  Cymbalta increased to 60 mg BID.  Follow-up in 2 weeks.

## 2013-09-04 NOTE — Assessment & Plan Note (Signed)
60 mg IM Toradol given by nursing.  Rx Imitrex for severe migraine. Migraines likely stemming from increased stressors and lack of nutrient intake. Encouraged well-balanced diet. Cymbalta increased to 60 mg BID.  Follow-up in 2 weeks.

## 2013-09-04 NOTE — Telephone Encounter (Addendum)
C/o:  Pt called with complaints of headaches and ringing in the ears.    Headaches- started when patient began Wellbutrin.  Headache present everyday.  He has slowly weaned his self off of the medication, but the headaches have not gone away.  Has a headache today.  Woke up with HA.  Rated 7/10.  Constant, throbbing ache.  Located mostly on both sides of head.  States temples are sore.  Denies vision changes.  Feels fainty or blacks out whenever he stands too quickly from lying position.  Has self-treated with Hydrocodone 5-325 mg 1 tablet.  Has helped some, but headache still present.    Ringing in ears-  Always present, but louder now.     Advice: Appt with Elyn Aquas, PA today at 11:30 am.  Pt agreed.

## 2013-09-04 NOTE — Progress Notes (Signed)
Pre visit review using our clinic review tool, if applicable. No additional management support is needed unless otherwise documented below in the visit note. 

## 2013-09-04 NOTE — Progress Notes (Signed)
Patient presents to clinic today c/o frequent headaches over the past week that have been associated with photophobia and phonophobia.  Patient states that he has been in a bad mood and feels more stressed out recently. Has had occasional headache since starting Wellbutrin  Thought headaches were stemming from his Wellbutrin and has weaned himself off of medication.  Still having headaches.  States Wellbutrin was not helping mood. Patient denies lightheadedness or dizziness.  Does note that he has not had anything to eat since yesterday at lunch time.  Usually wakes up with headaches.  Has taken Norco for relief of headache symptoms. Continues to take Cymbalta as directed.  Past Medical History  Diagnosis Date  . Dyspnea   . History of pneumonia   . Hypercholesteremia   . Overweight(278.02)   . Diverticulosis of colon   . Irritable bowel syndrome (IBS)   . NAFLD (nonalcoholic fatty liver disease)   . Male hypogonadism   . LBP (low back pain)   . Chronic pain syndrome   . Anxiety   . Depression   . Chicken pox   . Diverticulitis of colon without hemorrhage 06/04/2013    Current Outpatient Prescriptions on File Prior to Visit  Medication Sig Dispense Refill  . clonazePAM (KLONOPIN) 1 MG tablet TAKE 1 TABLET BY MOUTH TWICE A DAY AS NEEDED  60 tablet  1  . HYDROcodone-acetaminophen (NORCO/VICODIN) 5-325 MG per tablet Take 1 tablet by mouth 2 (two) times daily as needed for pain.       No current facility-administered medications on file prior to visit.    Allergies  Allergen Reactions  . Crestor [Rosuvastatin Calcium]     Severe leg cramping  . Sulfa Antibiotics     rash    Family History  Problem Relation Age of Onset  . Diabetes Maternal Grandfather   . Healthy Mother     Living  . Pneumonia Father 47    Deceased  . Bone cancer Paternal Grandfather   . Healthy Brother   . Healthy Son     x1  . Healthy Daughter     x2    History   Social History  . Marital Status:  Married    Spouse Name: Malachy Mood    Number of Children: 3  . Years of Education: N/A   Occupational History  . purchasing    Social History Main Topics  . Smoking status: Never Smoker   . Smokeless tobacco: Never Used  . Alcohol Use: No  . Drug Use: No  . Sexual Activity: None   Other Topics Concern  . None   Social History Narrative  . None   Review of Systems - See HPI.  All other ROS are negative.  BP 125/90  Pulse 108  Temp(Src) 98 F (36.7 C)  Wt 255 lb (115.667 kg)  SpO2 98%  Physical Exam  Vitals reviewed. Constitutional: He is oriented to person, place, and time and well-developed, well-nourished, and in no distress.  HENT:  Head: Normocephalic and atraumatic.  Right Ear: External ear normal.  Left Ear: External ear normal.  Nose: Nose normal.  Mouth/Throat: Oropharynx is clear and moist. No oropharyngeal exudate.  TM within normal limits bilaterally.  Eyes: Conjunctivae and EOM are normal. Pupils are equal, round, and reactive to light.  Neck: Neck supple. No thyromegaly present.  Cardiovascular: Regular rhythm, normal heart sounds and intact distal pulses.   Slightly tachycardic.  Pulmonary/Chest: Effort normal. No respiratory distress. He has no wheezes. He  has no rales. He exhibits no tenderness.  Lymphadenopathy:    He has no cervical adenopathy.  Neurological: He is alert and oriented to person, place, and time. No cranial nerve deficit.  Skin: Skin is warm and dry. No rash noted.  Psychiatric: Affect normal.    Recent Results (from the past 2160 hour(s))  LIPID PANEL     Status: Abnormal   Collection Time    07/29/13 10:09 AM      Result Value Ref Range   Cholesterol 223 (*) 0 - 200 mg/dL   Comment: ATP III Classification:           < 200        mg/dL        Desirable          200 - 239     mg/dL        Borderline High          >= 240        mg/dL        High         Triglycerides 224 (*) <150 mg/dL   HDL 38 (*) >39 mg/dL   Total CHOL/HDL  Ratio 5.9     VLDL 45 (*) 0 - 40 mg/dL   LDL Cholesterol 140 (*) 0 - 99 mg/dL   Comment:       Total Cholesterol/HDL Ratio:CHD Risk                            Coronary Heart Disease Risk Table                                            Men       Women              1/2 Average Risk              3.4        3.3                  Average Risk              5.0        4.4               2X Average Risk              9.6        7.1               3X Average Risk             23.4       11.0     Use the calculated Patient Ratio above and the CHD Risk table      to determine the patient's CHD Risk.     ATP III Classification (LDL):           < 100        mg/dL         Optimal          100 - 129     mg/dL         Near or Above Optimal          130 - 159     mg/dL         Borderline High  160 - 189     mg/dL         High           > 190        mg/dL         Very High        TESTOSTERONE, FREE, TOTAL     Status: None   Collection Time    07/29/13 10:09 AM      Result Value Ref Range   Testosterone 310  300 - 890 ng/dL   Comment:           Tanner Stage       Male              Male                   I              < 30 ng/dL        < 10 ng/dL                   II             < 150 ng/dL       < 30 ng/dL                   III            100-320 ng/dL     < 35 ng/dL                   IV             200-970 ng/dL     15-40 ng/dL                   V/Adult        300-890 ng/dL     10-70 ng/dL         Sex Hormone Binding 35  13 - 71 nmol/L   Testosterone, Free 59.0  47.0 - 244.0 pg/mL   Comment:       The concentration of free testosterone is derived from a mathematical     expression based on constants for the binding of testosterone to sex     hormone-binding globulin and albumin.   Testosterone-% Free 1.9  1.6 - 2.9 %  TSH     Status: None   Collection Time    07/29/13 10:09 AM      Result Value Ref Range   TSH 1.224  0.350 - 4.500 uIU/mL    Assessment/Plan: Migraine without aura  and without status migrainosus, not intractable 60 mg IM Toradol given by nursing.  Rx Imitrex for severe migraine. Migraines likely stemming from increased stressors and lack of nutrient intake. Encouraged well-balanced diet. Cymbalta increased to 60 mg BID.  Follow-up in 2 weeks.  Anxiety and depression Will hold off on Wellbutrin.  Cymbalta increased to 60 mg BID.  Follow-up in 2 weeks.

## 2013-09-04 NOTE — Patient Instructions (Signed)
Please stay well hydrated.  Do not skip meals as this is likely exacerbating the headaches.  Take new dose of Cymbalta as directed.  Take Imitrex if headache recurs. You may repeat the dose 2 hours later if needed, but do not take more than 2 doses in 24 hours.  Follow-up with me in 2-3 weeks.  Return sooner if needed.

## 2013-09-04 NOTE — Telephone Encounter (Signed)
Caller name: Frandy  Call back [phone] Pharmacy:  Reason for call:  Having headaches everyday, ears ringing.  Not sure if it is medication related.  Just needs advice.  Please call back.

## 2013-12-03 ENCOUNTER — Other Ambulatory Visit: Payer: Self-pay | Admitting: Physician Assistant

## 2013-12-04 ENCOUNTER — Telehealth: Payer: Self-pay | Admitting: *Deleted

## 2013-12-04 NOTE — Telephone Encounter (Signed)
Needs follow-up for full refill.  Ok to phone in 10 tablets until patient can be seen.

## 2013-12-04 NOTE — Telephone Encounter (Signed)
rx refill- clonazepam 1 mg  Last OV -09/04/13 Last refilled- 07/29/13 # 60 / 1 rf  UDS- none.

## 2013-12-07 MED ORDER — CLONAZEPAM 1 MG PO TABS: ORAL_TABLET | ORAL | Status: DC

## 2013-12-07 NOTE — Telephone Encounter (Signed)
rx sent  Pt notified. 

## 2013-12-16 ENCOUNTER — Ambulatory Visit: Payer: Self-pay | Admitting: Physician Assistant

## 2013-12-18 ENCOUNTER — Ambulatory Visit (INDEPENDENT_AMBULATORY_CARE_PROVIDER_SITE_OTHER): Payer: BC Managed Care – PPO | Admitting: Physician Assistant

## 2013-12-18 ENCOUNTER — Encounter: Payer: Self-pay | Admitting: Physician Assistant

## 2013-12-18 DIAGNOSIS — F32A Depression, unspecified: Secondary | ICD-10-CM

## 2013-12-18 DIAGNOSIS — F419 Anxiety disorder, unspecified: Secondary | ICD-10-CM

## 2013-12-18 DIAGNOSIS — G43009 Migraine without aura, not intractable, without status migrainosus: Secondary | ICD-10-CM

## 2013-12-18 DIAGNOSIS — F329 Major depressive disorder, single episode, unspecified: Secondary | ICD-10-CM

## 2013-12-18 DIAGNOSIS — E7800 Pure hypercholesterolemia, unspecified: Secondary | ICD-10-CM

## 2013-12-18 DIAGNOSIS — F418 Other specified anxiety disorders: Secondary | ICD-10-CM

## 2013-12-18 MED ORDER — PROPRANOLOL HCL 40 MG PO TABS: 40.0000 mg | ORAL_TABLET | Freq: Two times a day (BID) | ORAL | Status: DC

## 2013-12-18 MED ORDER — CLONAZEPAM 1 MG PO TABS: ORAL_TABLET | ORAL | Status: DC

## 2013-12-18 NOTE — Assessment & Plan Note (Signed)
Deteriorated.  Will begin Inderal 40 mg BID for prophylactic therapy.  Dietary measures discussed.  Follow-up in 1 month.

## 2013-12-18 NOTE — Patient Instructions (Signed)
For migraines -- Start taking the Propanolol (Inderal) twice daily as directed.  This will help prevent migraine headaches.  Stay well hydrated.  For cholesterol -- Start taking the Lipitor every other day and begin a CoQ 10 supplement to help stop muscle aches with the medication.  If not improving over the next couple of weeks let me know so I can change medication.  For URI symptoms -- Please increase your fluid intake and get plenty of rest.  Start Mucinex-DM taking as directed.  Place a humidifier in the bedroom.  Call or return to clinic if symptoms are not improving.  Follow-up in 1 month.

## 2013-12-18 NOTE — Progress Notes (Signed)
Patient presents to clinic today for follow-up of fatigue and chronic pain.  Patient doing well with Cymbalta daily.  Pain improving.  Has notices a mild improvement in fatigue.  Endorses stress is improved.  Only taking Klonopin as needed.  Denies panic attack.  Patient having migraines daily now.    Patient also endorses mild cough and nasal congestion x 2 days.  Denies fever, chills, malaise/fatigue. Has not taken anything for symptoms.  Daughter sick with similar symptoms.  Past Medical History  Diagnosis Date  . Dyspnea   . History of pneumonia   . Hypercholesteremia   . Overweight(278.02)   . Diverticulosis of colon   . Irritable bowel syndrome (IBS)   . NAFLD (nonalcoholic fatty liver disease)   . Male hypogonadism   . LBP (low back pain)   . Chronic pain syndrome   . Anxiety   . Depression   . Chicken pox   . Diverticulitis of colon without hemorrhage 06/04/2013    Current Outpatient Prescriptions on File Prior to Visit  Medication Sig Dispense Refill  . atorvastatin (LIPITOR) 10 MG tablet Take 1 tablet (10 mg total) by mouth daily. 90 tablet 1  . DULoxetine (CYMBALTA) 60 MG capsule Take 1 capsule (60 mg total) by mouth 2 (two) times daily. TAKE ONE CAPSULE BY MOUTH TWICE A DAY 60 capsule 0  . SUMAtriptan (IMITREX) 50 MG tablet Take 1 tablet (50 mg total) by mouth once. May repeat in 2 hours if headache persists or recurs. 30 tablet 0   No current facility-administered medications on file prior to visit.    Allergies  Allergen Reactions  . Crestor [Rosuvastatin Calcium]     Severe leg cramping  . Sulfa Antibiotics     rash    Family History  Problem Relation Age of Onset  . Diabetes Maternal Grandfather   . Healthy Mother     Living  . Pneumonia Father 78    Deceased  . Bone cancer Paternal Grandfather   . Healthy Brother   . Healthy Son     x1  . Healthy Daughter     x2    History   Social History  . Marital Status: Married    Spouse Name: Malachy Mood      Number of Children: 3  . Years of Education: N/A   Occupational History  . purchasing    Social History Main Topics  . Smoking status: Never Smoker   . Smokeless tobacco: Never Used  . Alcohol Use: No  . Drug Use: No  . Sexual Activity: None   Other Topics Concern  . None   Social History Narrative   Review of Systems - See HPI.  All other ROS are negative.  BP 126/80 mmHg  Pulse 108  Temp(Src) 98 F (36.7 C) (Oral)  Resp 16  Wt 265 lb (120.203 kg)  SpO2 98%  Physical Exam  Constitutional: He is oriented to person, place, and time and well-developed, well-nourished, and in no distress.  HENT:  Head: Normocephalic and atraumatic.  Right Ear: External ear normal.  Left Ear: External ear normal.  Nose: Nose normal.  Mouth/Throat: Oropharynx is clear and moist. No oropharyngeal exudate.  TM within normal limits bilaterally.  Eyes: Conjunctivae and EOM are normal. Pupils are equal, round, and reactive to light.  Neck: Neck supple.  Cardiovascular: Normal rate, regular rhythm, normal heart sounds and intact distal pulses.   Pulmonary/Chest: Effort normal and breath sounds normal. No respiratory distress. He has  no wheezes. He has no rales. He exhibits no tenderness.  Neurological: He is alert and oriented to person, place, and time.  Skin: Skin is warm and dry. No rash noted.  Psychiatric: Affect normal.  Vitals reviewed.  Assessment/Plan: Migraine without aura and without status migrainosus, not intractable Deteriorated.  Will begin Inderal 40 mg BID for prophylactic therapy.  Dietary measures discussed.  Follow-up in 1 month.  Anxiety and depression Doing well.  Continue current regimen.  Clonazepam refilled.  Follow-up 1 month.  Hypercholesteremia Some myalgias with lipitor 10 mg daily.  Will attempt taking medication QOD. Also start CoQ10 supplement.

## 2013-12-18 NOTE — Assessment & Plan Note (Signed)
Doing well.  Continue current regimen.  Clonazepam refilled.  Follow-up 1 month.

## 2013-12-18 NOTE — Progress Notes (Signed)
Pre visit review using our clinic review tool, if applicable. No additional management support is needed unless otherwise documented below in the visit note. 

## 2013-12-18 NOTE — Assessment & Plan Note (Signed)
Some myalgias with lipitor 10 mg daily.  Will attempt taking medication QOD. Also start CoQ10 supplement.

## 2013-12-22 ENCOUNTER — Other Ambulatory Visit: Payer: Self-pay | Admitting: Physician Assistant

## 2013-12-22 NOTE — Telephone Encounter (Signed)
Medication Detail      Disp Refills Start End     DULoxetine (CYMBALTA) 60 MG capsule 60 capsule 0 09/04/2013     Sig - Route: Take 1 capsule (60 mg total) by mouth 2 (two) times daily. TAKE ONE CAPSULE BY MOUTH TWICE A DAY - Oral    E-Prescribing Status: Receipt confirmed by pharmacy (09/04/2013 12:07 PM EDT)     Associated Diagnoses    Anxiety and depression      Refill sent per [facility] refill protocol, provider aware/SLS

## 2014-01-13 ENCOUNTER — Telehealth: Payer: Self-pay | Admitting: *Deleted

## 2014-01-13 NOTE — Telephone Encounter (Signed)
Prior authorization initiated for Duloxetine through [facility]. Awaiting determination. JG//CMA

## 2014-01-23 ENCOUNTER — Other Ambulatory Visit: Payer: Self-pay | Admitting: Physician Assistant

## 2014-01-25 NOTE — Telephone Encounter (Signed)
Rx request to pharmacy/SLS Requested drug refills are authorized, however, the patient needs further evaluation and/or laboratory testing before further refills are given. Ask him to make an appointment for this.  Please call patient to schedule F/U appointment/SLS Thanks.

## 2014-01-25 NOTE — Telephone Encounter (Signed)
eScribe request from [facility] for refill on Clonazepam 1 mg, take [1] BID PRN Last filled - 12.18.15, #[id number] Last AEX - 12.18.15 Next AEX - 1-Mth, pt has not scheduled F/U visit Please Advise on refills/SLS

## 2014-01-25 NOTE — Telephone Encounter (Signed)
OK to send 60 tabs zero refills, but please contact pt to arrange follow up apt with [facility].

## 2014-01-27 NOTE — Telephone Encounter (Signed)
Left message for patient to return my call.

## 2014-01-28 NOTE — Telephone Encounter (Signed)
Left message for patient to return my call.

## 2014-01-29 NOTE — Telephone Encounter (Signed)
Left message for patient to return my call.

## 2014-02-02 ENCOUNTER — Ambulatory Visit: Payer: 59 | Admitting: Neurology

## 2014-02-03 ENCOUNTER — Encounter: Payer: Self-pay | Admitting: Neurology

## 2014-02-03 ENCOUNTER — Ambulatory Visit: Payer: 59 | Admitting: Neurology

## 2014-02-03 ENCOUNTER — Ambulatory Visit (INDEPENDENT_AMBULATORY_CARE_PROVIDER_SITE_OTHER): Payer: 59 | Admitting: Neurology

## 2014-02-03 VITALS — BP 152/90 | HR 88 | Resp 14 | Ht 76.0 in | Wt 270.0 lb

## 2014-02-03 DIAGNOSIS — M5441 Lumbago with sciatica, right side: Secondary | ICD-10-CM

## 2014-02-03 DIAGNOSIS — F419 Anxiety disorder, unspecified: Secondary | ICD-10-CM

## 2014-02-03 DIAGNOSIS — F329 Major depressive disorder, single episode, unspecified: Secondary | ICD-10-CM

## 2014-02-03 DIAGNOSIS — R51 Headache: Secondary | ICD-10-CM

## 2014-02-03 DIAGNOSIS — R519 Headache, unspecified: Secondary | ICD-10-CM

## 2014-02-03 DIAGNOSIS — M5416 Radiculopathy, lumbar region: Secondary | ICD-10-CM

## 2014-02-03 DIAGNOSIS — F418 Other specified anxiety disorders: Secondary | ICD-10-CM

## 2014-02-03 DIAGNOSIS — F32A Depression, unspecified: Secondary | ICD-10-CM

## 2014-02-03 MED ORDER — HYDROCODONE-ACETAMINOPHEN 7.5-325 MG PO TABS: 1.0000 | ORAL_TABLET | Freq: Three times a day (TID) | ORAL | Status: DC | PRN

## 2014-02-03 MED ORDER — CYCLOBENZAPRINE HCL 5 MG PO TABS: 5.0000 mg | ORAL_TABLET | Freq: Three times a day (TID) | ORAL | Status: DC | PRN

## 2014-02-03 NOTE — Progress Notes (Signed)
[facility]  PATIENT: Kurt Mcdonald DOB: Nov 01, 1965  REFERRING CLINICIAN: Marcelline Mates HISTORY FROM: Patient REASON FOR VISIT: Back pain   HISTORICAL  CHIEF COMPLAINT:  Chief Complaint  Patient presents with  . Back Pain    Sts. lower back pain is worse, worse on right and radiating down post aspect of right leg to mid-thigh.  He is requesting a r/f of Hydrocodone./fim    HISTORY OF PRESENT ILLNESS:  Kurt Mcdonald is a 49 yo man with LBP that sometimes radiates into the right leg.   Pain increased with walking or sitting a long time.   Shifting around or short walks help the pain some.    He has some numbness going into the leg at times with some numbness in the toes on the right.    No specific weakness.   Pain is more achy in the back and when worse, throbbing into the legs.    He takes hydrocodone as needed up to 3 / day and is also on duloxetine.    He feels tired and weaker in general.   He is trying to walk a little bit everyday.    His true migraines are rare and helped by sumatriptan.   He has more frequent milder headaches at time that are bioccipital and sometimes increase over the course of the day.    He feels his mood issues are stable and he tolerates Duloxetine well  REVIEW OF SYSTEMS:  Constitutional: No fevers, chills, sweats, or change in appetite Eyes: No visual changes, double vision, eye pain Ear, nose and throat: No hearing loss, ear pain, nasal congestion, sore throat Cardiovascular: No chest pain, palpitations Respiratory:  No shortness of breath at rest or with exertion.   No wheezes GastrointestinaI: No nausea, vomiting, diarrhea, abdominal pain, fecal incontinence Genitourinary:  No dysuria, urinary retention or frequency.  No nocturia. Musculoskeletal:  No neck pain, back pain Integumentary: No rash, pruritus, skin lesions Neurological: as above Psychiatric: No depression at this time.  No anxiety Endocrine: No  palpitations, diaphoresis, change in appetite, change in weigh or increased thirst Hematologic/Lymphatic:  No anemia, purpura, petechiae. Allergic/Immunologic: No itchy/runny eyes, nasal congestion, recent allergic reactions, rashes  ALLERGIES: Allergies  Allergen Reactions  . Crestor [Rosuvastatin Calcium]     Severe leg cramping  . Sulfa Antibiotics     rash    HOME MEDICATIONS: Outpatient Prescriptions Prior to Visit  Medication Sig Dispense Refill  . atorvastatin (LIPITOR) 10 MG tablet Take 1 tablet (10 mg total) by mouth daily. 90 tablet 1  . clonazePAM (KLONOPIN) 1 MG tablet TAKE 1 TABLET BY MOUTH TWICE DAILY AS NEEDED 60 tablet 0  . DULoxetine (CYMBALTA) 60 MG capsule TAKE ONE CAPSULE BY MOUTH TWICE DAILY 60 capsule 1  . HYDROcodone-acetaminophen (NORCO) 7.5-325 MG per tablet   0  . propranolol (INDERAL) 40 MG tablet Take 1 tablet (40 mg total) by mouth 2 (two) times daily. (Patient not taking: Reported on 02/03/2014) 60 tablet 1  . SUMAtriptan (IMITREX) 50 MG tablet Take 1 tablet (50 mg total) by mouth once. May repeat in 2 hours if headache persists or recurs. (Patient not taking: Reported on 02/03/2014) 30 tablet 0   No facility-administered medications prior to visit.    PAST MEDICAL HISTORY: Past Medical History  Diagnosis Date  . Dyspnea   . History of pneumonia   . Hypercholesteremia   . Overweight(278.02)   . Diverticulosis of colon   . Irritable bowel syndrome (IBS)   .  NAFLD (nonalcoholic fatty liver disease)   . Male hypogonadism   . LBP (low back pain)   . Chronic pain syndrome   . Anxiety   . Depression   . Chicken pox   . Diverticulitis of colon without hemorrhage 06/04/2013  . Headache   . Vision abnormalities     PAST SURGICAL HISTORY: Past Surgical History  Procedure Laterality Date  . Shoulder sugery  01/2010    to remove plate in shoulder  . Tooth extraction  11/2011  . Wisdom tooth extraction      FAMILY HISTORY: Family History  Problem  Relation Age of Onset  . Diabetes Maternal Grandfather   . Healthy Mother     Living  . Pneumonia Father 34    Deceased  . Bone cancer Paternal Grandfather   . Healthy Brother   . Healthy Son     x1  . Healthy Daughter     x2    SOCIAL HISTORY:  History   Social History  . Marital Status: Married    Spouse Name: Elnita Maxwell    Number of Children: 3  . Years of Education: N/A   Occupational History  . purchasing    Social History Main Topics  . Smoking status: Never Smoker   . Smokeless tobacco: Never Used  . Alcohol Use: No  . Drug Use: No  . Sexual Activity: Not on file   Other Topics Concern  . Not on file   Social History Narrative     PHYSICAL EXAM  Filed Vitals:   02/03/14 0855  BP: 152/90  Pulse: 88  Resp: 14  Height: 6\' 4"  (1.93 m)  Weight: 270 lb (122.471 kg)    Body mass index is 32.88 kg/(m^2).   General: The patient is well-developed and well-nourished and in no acute distress  Eyes:  Funduscopic exam shows normal optic discs and retinal vessels.  Neck: The neck is supple, no carotid bruits are noted.  The neck is nontender.  Respiratory: The respiratory examination is clear.  Cardiovascular: The cardiovascular examination reveals a regular rate and rhythm, no murmurs, gallops or rubs are noted.  Skin: Extremities are without significant edema.  Neurologic Exam  Mental status: The patient is alert and oriented x 3 at the time of the examination. The patient has apparent normal recent and remote memory, with an apparently normal attention span and concentration ability.   Speech is normal.  Cranial nerves: Extraocular movements are full. Pupils are equal, round, and reactive to light and accomodation.  Visual fields are full.  Facial symmetry is present. There is good facial sensation to soft touch bilaterally.Facial strength is normal.  Trapezius and sternocleidomastoid strength is normal. No dysarthria is noted.  The tongue is midline, and  the patient has symmetric elevation of the soft palate. No obvious hearing deficits are noted.  Motor:  Muscle bulk and tone are normal. Strength is  5 / 5 in all 4 extremities.   Sensory: Sensory testing is intact to pinprick, soft touch, vibration sensation, and position sense on all 4 extremities.  Coordination: Cerebellar testing reveals good finger-nose-finger and heel-to-shin bilaterally.  Gait and station: Station and gait are normal. Tandem gait is normal. Romberg is negative.   Reflexes: Deep tendon reflexes are symmetric and normal bilaterally. Plantar responses are normal.    DIAGNOSTIC DATA (LABS, IMAGING, TESTING) - I reviewed patient records, labs, notes, testing and imaging myself where available.  Lab Results  Component Value Date   WBC 8.6  06/04/2013   HGB 15.0 06/04/2013   HCT 45.1 06/04/2013   MCV 91.3 06/04/2013   PLT 462.0* 06/04/2013      Component Value Date/Time   NA 138 06/04/2013 1323   K 3.6 06/04/2013 1323   CL 101 06/04/2013 1323   CO2 26 06/04/2013 1323   GLUCOSE 67* 06/04/2013 1323   BUN 13 06/04/2013 1323   CREATININE 1.4 06/04/2013 1323   CALCIUM 10.2 06/04/2013 1323   PROT 7.6 06/04/2013 1323   ALBUMIN 4.5 06/04/2013 1323   AST 26 06/04/2013 1323   ALT 40 06/04/2013 1323   ALKPHOS 59 06/04/2013 1323   BILITOT 0.7 06/04/2013 1323   GFRNONAA 59* 04/14/2013 1415   GFRAA 68* 04/14/2013 1415   Lab Results  Component Value Date   CHOL 223* 07/29/2013   HDL 38* 07/29/2013   LDLCALC 140* 07/29/2013   LDLDIRECT 177.0 04/05/2010   TRIG 224* 07/29/2013   CHOLHDL 5.9 07/29/2013   No results found for: HGBA1C No results found for: VITAMINB12 Lab Results  Component Value Date   TSH 1.224 07/29/2013        ASSESSMENT AND PLAN  Right-sided low back pain with sciatica, sciatica laterality unspecified  Lumbar radicular pain  Headache, unspecified headache type  Anxiety and depression   In summary, Kurt Mcdonald is a  49 year old man with back pain and lumbosacral degenerative changes. Pain has worsened over the past few months. I will refill his hydrocodone and start him on a muscle relaxant for a month or so. Additionally, using sterile technique, a trigger point injection was performed with 80 mg of Depo Medrol in 6 mL Marcaine injected into the right piriformis muscle and the right L4 and L5 paraspinal muscles. He tolerated it well. If the headaches do not improve, consider adding an anti-inflammatory switching the Flexeril to tizanidine.  Kurt Mcdonald will return in 4 months or sooner if he has new or worsening neurologic symptoms.   Richard A. Epimenio Foot, MD, PhD 02/03/2014, 9:03 AM Certified in Neurology, Clinical Neurophysiology, Sleep Medicine, Pain Medicine and Neuroimaging  [facility] [address] [place], [place] [address] [phone]

## 2014-03-17 ENCOUNTER — Telehealth: Payer: Self-pay | Admitting: *Deleted

## 2014-03-17 ENCOUNTER — Encounter: Payer: Self-pay | Admitting: Physician Assistant

## 2014-03-17 ENCOUNTER — Ambulatory Visit (INDEPENDENT_AMBULATORY_CARE_PROVIDER_SITE_OTHER): Payer: 59 | Admitting: Physician Assistant

## 2014-03-17 VITALS — BP 132/84 | HR 100 | Temp 97.8°F | Resp 18 | Ht 76.0 in | Wt 270.5 lb

## 2014-03-17 DIAGNOSIS — F329 Major depressive disorder, single episode, unspecified: Secondary | ICD-10-CM

## 2014-03-17 DIAGNOSIS — J019 Acute sinusitis, unspecified: Secondary | ICD-10-CM

## 2014-03-17 DIAGNOSIS — F32A Depression, unspecified: Secondary | ICD-10-CM

## 2014-03-17 DIAGNOSIS — F418 Other specified anxiety disorders: Secondary | ICD-10-CM

## 2014-03-17 DIAGNOSIS — G43009 Migraine without aura, not intractable, without status migrainosus: Secondary | ICD-10-CM

## 2014-03-17 DIAGNOSIS — E7800 Pure hypercholesterolemia, unspecified: Secondary | ICD-10-CM

## 2014-03-17 DIAGNOSIS — B9689 Other specified bacterial agents as the cause of diseases classified elsewhere: Secondary | ICD-10-CM | POA: Insufficient documentation

## 2014-03-17 DIAGNOSIS — F419 Anxiety disorder, unspecified: Secondary | ICD-10-CM

## 2014-03-17 MED ORDER — DULOXETINE HCL 60 MG PO CPEP: 60.0000 mg | ORAL_CAPSULE | Freq: Two times a day (BID) | ORAL | Status: DC

## 2014-03-17 MED ORDER — METOPROLOL SUCCINATE ER 25 MG PO TB24: 25.0000 mg | ORAL_TABLET | Freq: Every day | ORAL | Status: DC

## 2014-03-17 MED ORDER — AMITRIPTYLINE HCL 10 MG PO TABS: 10.0000 mg | ORAL_TABLET | Freq: Every day | ORAL | Status: DC

## 2014-03-17 MED ORDER — DOXYCYCLINE HYCLATE 100 MG PO CAPS: 100.0000 mg | ORAL_CAPSULE | Freq: Two times a day (BID) | ORAL | Status: DC

## 2014-03-17 MED ORDER — CLONAZEPAM 1 MG PO TABS: 1.0000 mg | ORAL_TABLET | Freq: Two times a day (BID) | ORAL | Status: DC | PRN

## 2014-03-17 MED ORDER — BECLOMETHASONE DIPROPIONATE 80 MCG/ACT NA AERS: 2.0000 | INHALATION_SPRAY | Freq: Every day | NASAL | Status: DC

## 2014-03-17 MED ORDER — EZETIMIBE 10 MG PO TABS: 10.0000 mg | ORAL_TABLET | Freq: Every day | ORAL | Status: DC

## 2014-03-17 NOTE — Assessment & Plan Note (Signed)
Pulse improved on recheck. Daily symptoms. Also with insomnia and anxiety. We'll begin Elavil 10 mg nightly. Patient started on Toprol-XL 25 mg for heart rate control while starting this medication. We'll wean off as indicated. Alarm signs and symptoms discussed with patient. Continue Imitrex as directed. Follow-up in 3-4 weeks.

## 2014-03-17 NOTE — Progress Notes (Signed)
Patient presents to clinic today for follow up of migraine headaches. Last visit patient was started on Inderal 40 mg twice daily. Endorses no improvement with this medication regimen. Has been out of medication for greater than one month, including his Imitrex.  Endorses continued anxiety stemming from his headaches. Is now having a migraine daily.  Denies lightheadedness. Does endorse some sinus pressure, sinus pain, nasal congestion and postnasal drip over the past 1.5 weeks that is progressively worsening. Denies fever or chills, but does endorse ear pain and tinnitus. Has not taken anything for symptoms.  Patient also  requesting discuss medications for his high cholesterol and nonalcoholic fatty liver disease. Patient has failed trial of Crestor, Zocor and Lipitor. All without myalgias. Is wondering if there is another option.   Past Medical History  Diagnosis Date  . Dyspnea   . History of pneumonia   . Hypercholesteremia   . Overweight(278.02)   . Diverticulosis of colon   . Irritable bowel syndrome (IBS)   . NAFLD (nonalcoholic fatty liver disease)   . Male hypogonadism   . LBP (low back pain)   . Chronic pain syndrome   . Anxiety   . Depression   . Chicken pox   . Diverticulitis of colon without hemorrhage 06/04/2013  . Headache   . Vision abnormalities     Current Outpatient Prescriptions on File Prior to Visit  Medication Sig Dispense Refill  . cyclobenzaprine (FLEXERIL) 5 MG tablet Take 1 tablet (5 mg total) by mouth 3 (three) times daily as needed for muscle spasms. 90 tablet 3  . HYDROcodone-acetaminophen (NORCO) 7.5-325 MG per tablet Take 1 tablet by mouth 3 (three) times daily as needed for moderate pain. 90 tablet 0   No current facility-administered medications on file prior to visit.    Allergies  Allergen Reactions  . Crestor [Rosuvastatin Calcium]     Severe leg cramping  . Sulfa Antibiotics     rash    Family History  Problem Relation Age of Onset    . Diabetes Maternal Grandfather   . Healthy Mother     Living  . Pneumonia Father 63    Deceased  . Bone cancer Paternal Grandfather   . Healthy Brother   . Healthy Son     x1  . Healthy Daughter     x2    History   Social History  . Marital Status: Married    Spouse Name: Malachy Mood  . Number of Children: 3  . Years of Education: N/A   Occupational History  . purchasing    Social History Main Topics  . Smoking status: Never Smoker   . Smokeless tobacco: Never Used  . Alcohol Use: No  . Drug Use: No  . Sexual Activity: Not on file   Other Topics Concern  . None   Social History Narrative   Review of Systems - See HPI.  All other ROS are negative.  BP 132/84 mmHg  Pulse 100  Temp(Src) 97.8 F (36.6 C) (Oral)  Resp 18  Ht 6\' 4"  (1.93 m)  Wt 270 lb 8 oz (122.698 kg)  BMI 32.94 kg/m2  SpO2 97%  Physical Exam  Constitutional: He is oriented to person, place, and time and well-developed, well-nourished, and in no distress.  HENT:  Head: Normocephalic and atraumatic.  Right Ear: External ear and ear canal normal.  Left Ear: External ear and ear canal normal.  Nose: Mucosal edema and rhinorrhea present. Right sinus exhibits maxillary sinus  tenderness. Left sinus exhibits maxillary sinus tenderness.  Mouth/Throat: Uvula is midline, oropharynx is clear and moist and mucous membranes are normal.  Fluid level noted behind TMs bilaterally.  Eyes: Conjunctivae are normal.  Neck: Neck supple.  Cardiovascular: Normal rate, regular rhythm, normal heart sounds and intact distal pulses.   Pulmonary/Chest: Effort normal and breath sounds normal. No respiratory distress. He has no wheezes. He has no rales. He exhibits no tenderness.  Lymphadenopathy:    He has no cervical adenopathy.  Neurological: He is alert and oriented to person, place, and time.  Skin: Skin is warm and dry. No rash noted.  Psychiatric: Affect normal.  Vitals reviewed.  Assessment/Plan: Migraine  without aura and without status migrainosus, not intractable Pulse improved on recheck. Daily symptoms. Also with insomnia and anxiety. We'll begin Elavil 10 mg nightly. Patient started on Toprol-XL 25 mg for heart rate control while starting this medication. We'll wean off as indicated. Alarm signs and symptoms discussed with patient. Continue Imitrex as directed. Follow-up in 3-4 weeks.   Anxiety and depression We'll continue Cymbalta. Klonopin refill. We'll begin Elavil 10 mg nightly. Alarm signs and symptoms discussed with patient. Follow-up 3-4 weeks.   Hypercholesteremia Intolerant to multiple statins. We'll start Zetia 10 mg daily. Voucher given free 30 day trial. We'll proceed with prior authorization if the patient is tolerating medicine well.   Acute bacterial sinusitis With fluid behind ears. Rx doxycycline twice a day.  Mucinex. increase fluid. Rest. Saline nasal spray. Rx nasal steroid. Humidifier in bedroom.  Return precautions discussed with patient.

## 2014-03-17 NOTE — Patient Instructions (Signed)
Please continue chronic medications as directed.  For the sinus infection, take the doxycycline twice daily as directed. Use  nasal steroid daily. Increase fluids. Use plain Mucinex for congestion. Place a humidifier in bedroom. Call or return to clinic if symptoms are not improving.   For your migraines. Please continue the Imitrex as directed for migraine abortive therapy. Take the Toprol for heart rate control, and the Elavil nightly for migraine prophylaxis. This also help with sleep and anxiety.   Follow-up with me in 3-4 weeks. Please return sooner if symptoms worsen.

## 2014-03-17 NOTE — Telephone Encounter (Signed)
Prior authorization for Beclomethasone Dipropionate 80 MCG/ACT AERS initiated. Awaiting determination. JG//CMA

## 2014-03-17 NOTE — Assessment & Plan Note (Signed)
We'll continue Cymbalta. Klonopin refill. We'll begin Elavil 10 mg nightly. Alarm signs and symptoms discussed with patient. Follow-up 3-4 weeks.

## 2014-03-17 NOTE — Assessment & Plan Note (Addendum)
Intolerant to multiple statins. We'll start Zetia 10 mg daily. Voucher given free 30 day trial. We'll proceed with prior authorization if the patient is tolerating medicine well.

## 2014-03-17 NOTE — Progress Notes (Signed)
Pre visit review using our clinic review tool, if applicable. No additional management support is needed unless otherwise documented below in the visit note/SLS  

## 2014-03-17 NOTE — Assessment & Plan Note (Signed)
With fluid behind ears. Rx doxycycline twice a day.  Mucinex. increase fluid. Rest. Saline nasal spray. Rx nasal steroid. Humidifier in bedroom.  Return precautions discussed with patient.

## 2014-03-18 NOTE — Telephone Encounter (Signed)
PA approved until 03/17/2015. JG//CMA

## 2014-03-31 ENCOUNTER — Encounter: Payer: Self-pay | Admitting: Neurology

## 2014-03-31 ENCOUNTER — Ambulatory Visit (INDEPENDENT_AMBULATORY_CARE_PROVIDER_SITE_OTHER): Payer: 59 | Admitting: Neurology

## 2014-03-31 VITALS — BP 110/84 | HR 78 | Resp 12 | Ht 76.0 in | Wt 268.0 lb

## 2014-03-31 DIAGNOSIS — F419 Anxiety disorder, unspecified: Secondary | ICD-10-CM

## 2014-03-31 DIAGNOSIS — F418 Other specified anxiety disorders: Secondary | ICD-10-CM | POA: Diagnosis not present

## 2014-03-31 DIAGNOSIS — F32A Depression, unspecified: Secondary | ICD-10-CM

## 2014-03-31 DIAGNOSIS — F329 Major depressive disorder, single episode, unspecified: Secondary | ICD-10-CM

## 2014-03-31 DIAGNOSIS — M5441 Lumbago with sciatica, right side: Secondary | ICD-10-CM | POA: Insufficient documentation

## 2014-03-31 DIAGNOSIS — M549 Dorsalgia, unspecified: Secondary | ICD-10-CM

## 2014-03-31 DIAGNOSIS — G4700 Insomnia, unspecified: Secondary | ICD-10-CM | POA: Diagnosis not present

## 2014-03-31 MED ORDER — HYDROCODONE-ACETAMINOPHEN 7.5-325 MG PO TABS: 1.0000 | ORAL_TABLET | Freq: Three times a day (TID) | ORAL | Status: DC | PRN

## 2014-03-31 MED ORDER — ETODOLAC 400 MG PO TABS: 400.0000 mg | ORAL_TABLET | Freq: Two times a day (BID) | ORAL | Status: DC

## 2014-03-31 NOTE — Progress Notes (Signed)
[facility]  PATIENT: Kurt Mcdonald DOB: 06-Feb-1965     HISTORICAL  CHIEF COMPLAINT:  Chief Complaint  Patient presents with  . Back Pain    Sts. one week ago he was climbing a 10 ft. chain link fence--sts. his foot became tangled in the fence and he fell from the top of the fence--landed on his feet, fell to the right and rolled on a grassy surface.  Sts. right sided mid and lower back pain has radiating down posterior right leg to knee has been worse since then./fim    HISTORY OF PRESENT ILLNESS:  Kurt Mcdonald is a 49 yo man with LBP radiating into the right leg.   He has a recent fall.   He fell while climbing a fence.   He landed on his feet but right knee gave way and he fell to the ground.   Pain increased.   Current pain is in the mid back, right lower back and right leg.    Pain increases with walking or sitting a long time.   Shifting around or short walks help the pain some.    He has some numbness going into the leg at times with some numbness in the toes on the right.    No specific weakness.   Pain is more achy in the back and when worse, throbbing into the legs.    He takes hydrocodone as needed up to 3 / day and is also on duloxetine.    He feels tired and weaker in general.   He is trying to walk a little bit everyday.  Flexeril has not helped pain but makes him sleepy.    Mood:  He feels his mood issues are stable and he tolerates Duloxetine well  Insomnia is helped by clonazepam.   Flexeril helped slightly.  At last visit, was started on a muscle relaxant and had right piriformis and lower lumbar paraspinal muscle Trigger point injections.   ROS:  Out of a complete 14 system review of symptoms, the patient complains only of the following symptoms, and all other reviewed systems are negative.  Denies bladder changes.   No bowel changes   ALLERGIES: Allergies  Allergen Reactions  . Crestor [Rosuvastatin Calcium]     Severe leg cramping    . Sulfa Antibiotics     rash    HOME MEDICATIONS:  Current outpatient prescriptions:  .  amitriptyline (ELAVIL) 10 MG tablet, Take 1 tablet (10 mg total) by mouth at bedtime., Disp: 30 tablet, Rfl: 1 .  Beclomethasone Dipropionate 80 MCG/ACT AERS, Place 2 Squirts into the nose daily., Disp: 8.7 g, Rfl: 0 .  clonazePAM (KLONOPIN) 1 MG tablet, Take 1 tablet (1 mg total) by mouth 2 (two) times daily as needed., Disp: 60 tablet, Rfl: 0 .  cyclobenzaprine (FLEXERIL) 5 MG tablet, Take 1 tablet (5 mg total) by mouth 3 (three) times daily as needed for muscle spasms., Disp: 90 tablet, Rfl: 3 .  DULoxetine (CYMBALTA) 60 MG capsule, Take 1 capsule (60 mg total) by mouth 2 (two) times daily., Disp: 60 capsule, Rfl: 3 .  ezetimibe (ZETIA) 10 MG tablet, Take 1 tablet (10 mg total) by mouth daily., Disp: 30 tablet, Rfl: 1 .  HYDROcodone-acetaminophen (NORCO) 7.5-325 MG per tablet, Take 1 tablet by mouth 3 (three) times daily as needed for moderate pain., Disp: 90 tablet, Rfl: 0 .  metoprolol succinate (TOPROL-XL) 25 MG 24 hr tablet, Take 1 tablet (25 mg total) by mouth  daily., Disp: 30 tablet, Rfl: 3 .  doxycycline (VIBRAMYCIN) 100 MG capsule, Take 1 capsule (100 mg total) by mouth 2 (two) times daily. (Patient not taking: Reported on 03/31/2014), Disp: 20 capsule, Rfl: 0  PAST MEDICAL HISTORY: Past Medical History  Diagnosis Date  . Dyspnea   . History of pneumonia   . Hypercholesteremia   . Overweight(278.02)   . Diverticulosis of colon   . Irritable bowel syndrome (IBS)   . NAFLD (nonalcoholic fatty liver disease)   . Male hypogonadism   . LBP (low back pain)   . Chronic pain syndrome   . Anxiety   . Depression   . Chicken pox   . Diverticulitis of colon without hemorrhage 06/04/2013  . Headache   . Vision abnormalities     PAST SURGICAL HISTORY: Past Surgical History  Procedure Laterality Date  . Shoulder sugery  01/2010    to remove plate in shoulder  . Tooth extraction  11/2011  .  Wisdom tooth extraction      FAMILY HISTORY: Family History  Problem Relation Age of Onset  . Diabetes Maternal Grandfather   . Healthy Mother     Living  . Pneumonia Father 44    Deceased  . Bone cancer Paternal Grandfather   . Healthy Brother   . Healthy Son     x1  . Healthy Daughter     x2    SOCIAL HISTORY:  History   Social History  . Marital Status: Married    Spouse Name: Kurt Mcdonald  . Number of Children: 3  . Years of Education: N/A   Occupational History  . purchasing    Social History Main Topics  . Smoking status: Never Smoker   . Smokeless tobacco: Never Used  . Alcohol Use: No  . Drug Use: No  . Sexual Activity: Not on file   Other Topics Concern  . Not on file   Social History Narrative     PHYSICAL EXAM  Filed Vitals:   03/31/14 1056  BP: 110/84  Pulse: 78  Resp: 12  Height: 6\' 4"  (1.93 m)  Weight: 268 lb (121.564 kg)    Body mass index is 32.64 kg/(m^2).   General: The patient is well-developed and well-nourished and in no acute distress  Musculoskeletal:  Back is tender  +/- T9/10 and lower lumbar and right piriformis muscle  Neurologic Exam  Mental status:   A and O x 3  Motor:  Muscle bulk is normal in leg.   Tone is normal in legs. Strength is  5 / 5 in legs.   Sensory: Sensory testing is intact legs.  Gait and station: Station is normal.   Gait is normal. Tandem gait is normal.    DIAGNOSTIC DATA (LABS, IMAGING, TESTING) - I reviewed patient records, labs, notes, testing and imaging myself where available.    ASSESSMENT AND PLAN  Right-sided low back pain with right-sided sciatica  Mid back pain  Anxiety and depression  Insomnia   In summary, Kurt Mcdonald is a 49 year old man who had a reoccurrence of mid back and lower back back pain with right sciatica.   Pain is similar to what he has experienced in the past he also has some muscle stiffness. I did a trigger point injection with 80 mg of depot Medrol in  Marcaine injecting into the right piriformis muscle, right L2, L3, L4 and L5 paraspinal muscles and the T9-T10 paraspinal muscles. Afterwards, he felt pain was improved. I refilled the  hydrocodone and added etodolac as an NSAID. He will continue his medications for mood and insomnia.  He is advised to call us sooner if pain does not improve or if it worsens. Otherwise I will see him back in about 4 months.   Alleyne Lac A. Epimenio Foot, MD, PhD 03/31/2014, 11:10 AM Certified in Neurology, Clinical Neurophysiology, Sleep Medicine, Pain Medicine and Neuroimaging  [facility] [address], [address] [place], [place] [address] [phone]

## 2014-04-14 ENCOUNTER — Ambulatory Visit (INDEPENDENT_AMBULATORY_CARE_PROVIDER_SITE_OTHER): Payer: 59 | Admitting: Physician Assistant

## 2014-04-14 ENCOUNTER — Encounter: Payer: Self-pay | Admitting: Physician Assistant

## 2014-04-14 VITALS — HR 94 | Temp 97.9°F | Resp 16 | Ht 76.0 in | Wt 269.2 lb

## 2014-04-14 DIAGNOSIS — G43009 Migraine without aura, not intractable, without status migrainosus: Secondary | ICD-10-CM

## 2014-04-14 DIAGNOSIS — F419 Anxiety disorder, unspecified: Secondary | ICD-10-CM

## 2014-04-14 DIAGNOSIS — F418 Other specified anxiety disorders: Secondary | ICD-10-CM | POA: Diagnosis not present

## 2014-04-14 DIAGNOSIS — F329 Major depressive disorder, single episode, unspecified: Secondary | ICD-10-CM

## 2014-04-14 DIAGNOSIS — F32A Depression, unspecified: Secondary | ICD-10-CM

## 2014-04-14 MED ORDER — PAROXETINE HCL 20 MG PO TABS: 20.0000 mg | ORAL_TABLET | Freq: Every day | ORAL | Status: DC

## 2014-04-14 MED ORDER — ZOLPIDEM TARTRATE 10 MG PO TABS: 10.0000 mg | ORAL_TABLET | Freq: Every evening | ORAL | Status: DC | PRN

## 2014-04-14 NOTE — Progress Notes (Signed)
Patient presents to clinic today c/o continued chronic headaches, associated with photophobia and phonophobia. Patient denies dizziness or altered mental status with headaches. Is taking Elavil and metoprolol for migraine prophylaxis. Patient states he has noticed no change in symptoms since starting medications. Patient states his headaches are starting to feel "different" to him. Current headache has been present  Since this morning.  Past Medical History  Diagnosis Date  . Dyspnea   . History of pneumonia   . Hypercholesteremia   . Overweight(278.02)   . Diverticulosis of colon   . Irritable bowel syndrome (IBS)   . NAFLD (nonalcoholic fatty liver disease)   . Male hypogonadism   . LBP (low back pain)   . Chronic pain syndrome   . Anxiety   . Depression   . Chicken pox   . Diverticulitis of colon without hemorrhage 06/04/2013  . Headache   . Vision abnormalities     Current Outpatient Prescriptions on File Prior to Visit  Medication Sig Dispense Refill  . Beclomethasone Dipropionate 80 MCG/ACT AERS Place 2 Squirts into the nose daily. 8.7 g 0  . clonazePAM (KLONOPIN) 1 MG tablet Take 1 tablet (1 mg total) by mouth 2 (two) times daily as needed. 60 tablet 0  . DULoxetine (CYMBALTA) 60 MG capsule Take 1 capsule (60 mg total) by mouth 2 (two) times daily. 60 capsule 3  . etodolac (LODINE) 400 MG tablet Take 1 tablet (400 mg total) by mouth 2 (two) times daily. 60 tablet 3  . ezetimibe (ZETIA) 10 MG tablet Take 1 tablet (10 mg total) by mouth daily. 30 tablet 1  . HYDROcodone-acetaminophen (NORCO) 7.5-325 MG per tablet Take 1 tablet by mouth 3 (three) times daily as needed for moderate pain. 90 tablet 0  . metoprolol succinate (TOPROL-XL) 25 MG 24 hr tablet Take 1 tablet (25 mg total) by mouth daily. 30 tablet 3   No current facility-administered medications on file prior to visit.    Allergies  Allergen Reactions  . Crestor [Rosuvastatin Calcium]     Severe leg cramping  .  Sulfa Antibiotics     rash    Family History  Problem Relation Age of Onset  . Diabetes Maternal Grandfather   . Healthy Mother     Living  . Pneumonia Father 34    Deceased  . Bone cancer Paternal Grandfather   . Healthy Brother   . Healthy Son     x1  . Healthy Daughter     x2    History   Social History  . Marital Status: Married    Spouse Name: Malachy Mood  . Number of Children: 3  . Years of Education: N/A   Occupational History  . purchasing    Social History Main Topics  . Smoking status: Never Smoker   . Smokeless tobacco: Never Used  . Alcohol Use: No  . Drug Use: No  . Sexual Activity: Not on file   Other Topics Concern  . None   Social History Narrative   Review of Systems - See HPI.  All other ROS are negative.  Pulse 94  Temp(Src) 97.9 F (36.6 C) (Oral)  Resp 16  Ht 6\' 4"  (1.93 m)  Wt 269 lb 4 oz (122.131 kg)  BMI 32.79 kg/m2  SpO2 98%  Physical Exam  Constitutional: He is oriented to person, place, and time and well-developed, well-nourished, and in no distress.  HENT:  Head: Normocephalic and atraumatic.  Eyes: Conjunctivae and EOM are  normal. Pupils are equal, round, and reactive to light.  Neck: Neck supple.  Cardiovascular: Normal rate, regular rhythm, normal heart sounds and intact distal pulses.   Pulmonary/Chest: Effort normal and breath sounds normal. No respiratory distress. He has no wheezes. He has no rales. He exhibits no tenderness.  Neurological: He is alert and oriented to person, place, and time.  Skin: Skin is warm and dry. No rash noted.  Psychiatric: Affect normal.  Vitals reviewed.   No results found for this or any previous visit (from the past 2160 hour(s)).  Assessment/Plan: Migraine without aura and without status migrainosus, not intractable Unresponsive to triptans Patient encouraged to continue Toprol and Cymbalta. Will stop Elavil. We'll begin Ambien to aid in sleep as this will likely help headache  frequency. He is to use Norco as directed for headache. Will obtain CT of head to further assess. Patient encouraged to follow-up with his neurologist regarding chronic headaches.

## 2014-04-14 NOTE — Progress Notes (Signed)
Pre visit review using our clinic review tool, if applicable. No additional management support is needed unless otherwise documented below in the visit note/SLS  

## 2014-04-14 NOTE — Patient Instructions (Signed)
You will be contacted to schedule your CT scan. Please stop the Elavil. Continue the Cymbalta and Toprol. Begin the Paxil as directed. Take Ambien at bedtime to promote restful sleep. This will hopefully help with headaches. Use Flonase and Claritin to help remove fluid behind ears.  Schedule a follow-up with your Neurologist.  A referral has been made for insurance purposes.

## 2014-04-18 NOTE — Assessment & Plan Note (Signed)
Unresponsive to triptans Patient encouraged to continue Toprol and Cymbalta. Will stop Elavil. We'll begin Ambien to aid in sleep as this will likely help headache frequency. He is to use Norco as directed for headache. Will obtain CT of head to further assess. Patient encouraged to follow-up with his neurologist regarding chronic headaches.

## 2014-05-07 ENCOUNTER — Other Ambulatory Visit: Payer: Self-pay | Admitting: Physician Assistant

## 2014-05-14 ENCOUNTER — Ambulatory Visit: Payer: 59 | Admitting: Physician Assistant

## 2014-05-20 ENCOUNTER — Telehealth: Payer: Self-pay | Admitting: Neurology

## 2014-05-20 MED ORDER — HYDROCODONE-ACETAMINOPHEN 7.5-325 MG PO TABS: 1.0000 | ORAL_TABLET | Freq: Three times a day (TID) | ORAL | Status: DC | PRN

## 2014-05-20 NOTE — Telephone Encounter (Signed)
Rx. printed, signed, up front [facility]/fim 

## 2014-05-20 NOTE — Telephone Encounter (Signed)
Patient is calling to get a written Rx for HYDROcodone-acetaminophen (NORCO) 7.5-325 MG per tablet. I advised the patient the Rx will be ready in 24 hours unless the nurse advises otherwise. Thank you.

## 2014-05-21 ENCOUNTER — Ambulatory Visit (INDEPENDENT_AMBULATORY_CARE_PROVIDER_SITE_OTHER): Payer: 59 | Admitting: Physician Assistant

## 2014-05-21 ENCOUNTER — Encounter: Payer: Self-pay | Admitting: Physician Assistant

## 2014-05-21 VITALS — BP 130/90 | HR 99 | Temp 97.5°F | Ht 76.0 in | Wt 272.0 lb

## 2014-05-21 DIAGNOSIS — R0681 Apnea, not elsewhere classified: Secondary | ICD-10-CM | POA: Diagnosis not present

## 2014-05-21 DIAGNOSIS — G43009 Migraine without aura, not intractable, without status migrainosus: Secondary | ICD-10-CM

## 2014-05-21 DIAGNOSIS — R5382 Chronic fatigue, unspecified: Secondary | ICD-10-CM | POA: Diagnosis not present

## 2014-05-21 LAB — CBC WITH DIFFERENTIAL/PLATELET
BASOS PCT: 1 % (ref 0–1)
Basophils Absolute: 0.1 10*3/uL (ref 0.0–0.1)
Eosinophils Absolute: 0.2 10*3/uL (ref 0.0–0.7)
Eosinophils Relative: 2 % (ref 0–5)
HCT: 43.4 % (ref 39.0–52.0)
Hemoglobin: 14.8 g/dL (ref 13.0–17.0)
LYMPHS PCT: 37 % (ref 12–46)
Lymphs Abs: 3.3 10*3/uL (ref 0.7–4.0)
MCH: 30 pg (ref 26.0–34.0)
MCHC: 34.1 g/dL (ref 30.0–36.0)
MCV: 88 fL (ref 78.0–100.0)
MONO ABS: 0.6 10*3/uL (ref 0.1–1.0)
MPV: 9.1 fL (ref 8.6–12.4)
Monocytes Relative: 7 % (ref 3–12)
NEUTROS ABS: 4.7 10*3/uL (ref 1.7–7.7)
Neutrophils Relative %: 53 % (ref 43–77)
Platelets: 408 10*3/uL — ABNORMAL HIGH (ref 150–400)
RBC: 4.93 MIL/uL (ref 4.22–5.81)
RDW: 14.6 % (ref 11.5–15.5)
WBC: 8.8 10*3/uL (ref 4.0–10.5)

## 2014-05-21 LAB — BASIC METABOLIC PANEL
BUN: 16 mg/dL (ref 6–23)
CALCIUM: 9.6 mg/dL (ref 8.4–10.5)
CO2: 27 mEq/L (ref 19–32)
CREATININE: 1.31 mg/dL (ref 0.50–1.35)
Chloride: 102 mEq/L (ref 96–112)
Glucose, Bld: 96 mg/dL (ref 70–99)
Potassium: 4.7 mEq/L (ref 3.5–5.3)
SODIUM: 137 meq/L (ref 135–145)

## 2014-05-21 LAB — TSH: TSH: 1.664 u[IU]/mL (ref 0.350–4.500)

## 2014-05-21 LAB — VITAMIN B12: Vitamin B-12: 592 pg/mL (ref 211–911)

## 2014-05-21 NOTE — Patient Instructions (Signed)
Please go to the lab for blood work. I will call you with your results. Stay well hydrated and try to get in some daily exercise.  Will treat based on results.

## 2014-05-21 NOTE — Progress Notes (Signed)
Pre visit review using our clinic review tool, if applicable. No additional management support is needed unless otherwise documented below in the visit note. 

## 2014-05-21 NOTE — Progress Notes (Signed)
Patient presents to clinic today for follow-up of migraine headaches.  Patient endorses attempting to wean off of his various medications to see if they were contributing to symptoms.  Patient endorses complete resolution of headaches once he discontinued his Zetia.  Denies any recurrence of headaches, nausea or photophobia.    Patient would like to discuss increased fatigue over the past few months.  Denies shortness of breath, palpitations, cold intolerance, depressed mood or anhedonia.  Is trying to stay active.  Is eating a well-balanced diet.  Denies hx of anemia or thyroid disorder.  Has + history of hypogonadism, not currently being treated.  Patient states he wife has noticed increased snoring and apneic episodes over the past few months.  Past Medical History  Diagnosis Date  . Dyspnea   . History of pneumonia   . Hypercholesteremia   . Overweight(278.02)   . Diverticulosis of colon   . Irritable bowel syndrome (IBS)   . NAFLD (nonalcoholic fatty liver disease)   . Male hypogonadism   . LBP (low back pain)   . Chronic pain syndrome   . Anxiety   . Depression   . Chicken pox   . Diverticulitis of colon without hemorrhage 06/04/2013  . Headache   . Vision abnormalities     Current Outpatient Prescriptions on File Prior to Visit  Medication Sig Dispense Refill  . clonazePAM (KLONOPIN) 1 MG tablet TAKE 1 TABLET BY MOUTH TWICE DAILY AS NEEDED 60 tablet 0  . DULoxetine (CYMBALTA) 60 MG capsule Take 1 capsule (60 mg total) by mouth 2 (two) times daily. 60 capsule 3  . HYDROcodone-acetaminophen (NORCO) 7.5-325 MG per tablet Take 1 tablet by mouth 3 (three) times daily as needed for moderate pain. 90 tablet 0  . methocarbamol (ROBAXIN) 500 MG tablet Take 500 mg by mouth 3 (three) times daily as needed for muscle spasms.    Marland Kitchen zolpidem (AMBIEN) 10 MG tablet Take 1 tablet (10 mg total) by mouth at bedtime as needed for sleep. 15 tablet 1   No current facility-administered medications  on file prior to visit.    Allergies  Allergen Reactions  . Crestor [Rosuvastatin Calcium]     Severe leg cramping  . Sulfa Antibiotics     rash    Family History  Problem Relation Age of Onset  . Diabetes Maternal Grandfather   . Healthy Mother     Living  . Pneumonia Father 17    Deceased  . Bone cancer Paternal Grandfather   . Healthy Brother   . Healthy Son     x1  . Healthy Daughter     x2    History   Social History  . Marital Status: Married    Spouse Name: Malachy Mood  . Number of Children: 3  . Years of Education: N/A   Occupational History  . purchasing    Social History Main Topics  . Smoking status: Never Smoker   . Smokeless tobacco: Never Used  . Alcohol Use: No  . Drug Use: No  . Sexual Activity: Not on file   Other Topics Concern  . None   Social History Narrative   Review of Systems - See HPI.  All other ROS are negative.  BP 130/90 mmHg  Pulse 99  Temp(Src) 97.5 F (36.4 C) (Oral)  Ht 6\' 4"  (1.93 m)  Wt 272 lb (123.378 kg)  BMI 33.12 kg/m2  SpO2 96%  Physical Exam  Constitutional: He is oriented to person, place, and  time and well-developed, well-nourished, and in no distress.  HENT:  Head: Normocephalic and atraumatic.  Eyes: Conjunctivae are normal.  Neck: Neck supple. No thyromegaly present.  Cardiovascular: Normal rate, regular rhythm, normal heart sounds and intact distal pulses.   Pulmonary/Chest: Effort normal and breath sounds normal. No respiratory distress. He has no wheezes. He has no rales. He exhibits no tenderness.  Lymphadenopathy:    He has no cervical adenopathy.  Neurological: He is alert and oriented to person, place, and time.  Skin: Skin is warm and dry. No rash noted.  Psychiatric: Affect normal.  Vitals reviewed.    Assessment/Plan: Migraine without aura and without status migrainosus, not intractable Patient endorses resolution of migraines after discontinuing Zetia.  Discussed this is not a common  side effect of the medication and recommend another week off of medication to assess for any recurrent migraines.  If still doing well will permanently DC medication and start new cholesterol agent giving history of HLD and NAFLD.   Witnessed apneic spells With history of labile blood pressure.  Will refer to Pulmonology for assessment and sleep study.   Chronic fatigue Unclear etiology. Will obtain lab workup to include CBC, BMP, TSH, Testosterone, B12, Vitamin D.

## 2014-05-22 LAB — TESTOSTERONE: Testosterone: 184 ng/dL — ABNORMAL LOW (ref 300–890)

## 2014-05-23 ENCOUNTER — Encounter: Payer: Self-pay | Admitting: Physician Assistant

## 2014-05-23 ENCOUNTER — Other Ambulatory Visit: Payer: Self-pay | Admitting: Physician Assistant

## 2014-05-23 DIAGNOSIS — R0681 Apnea, not elsewhere classified: Secondary | ICD-10-CM | POA: Insufficient documentation

## 2014-05-23 DIAGNOSIS — R5382 Chronic fatigue, unspecified: Secondary | ICD-10-CM | POA: Insufficient documentation

## 2014-05-23 NOTE — Assessment & Plan Note (Signed)
With history of labile blood pressure.  Will refer to Pulmonology for assessment and sleep study.

## 2014-05-23 NOTE — Assessment & Plan Note (Signed)
Unclear etiology. Will obtain lab workup to include CBC, BMP, TSH, Testosterone, B12, Vitamin D.

## 2014-05-23 NOTE — Assessment & Plan Note (Signed)
Patient endorses resolution of migraines after discontinuing Zetia.  Discussed this is not a common side effect of the medication and recommend another week off of medication to assess for any recurrent migraines.  If still doing well will permanently DC medication and start new cholesterol agent giving history of HLD and NAFLD.

## 2014-05-24 LAB — VITAMIN D 1,25 DIHYDROXY
VITAMIN D 1, 25 (OH) TOTAL: 39 pg/mL (ref 18–72)
Vitamin D2 1, 25 (OH)2: <8 pg/mL
Vitamin D3 1, 25 (OH)2: 39 pg/mL

## 2014-05-24 NOTE — Telephone Encounter (Signed)
Patient requesting refill of Elavil but at visit Friday stated he was no longer taking this medication. Please verify refill with patient.

## 2014-06-07 ENCOUNTER — Ambulatory Visit: Payer: 59 | Admitting: Neurology

## 2014-06-09 ENCOUNTER — Encounter: Payer: Self-pay | Admitting: Neurology

## 2014-06-24 ENCOUNTER — Other Ambulatory Visit: Payer: Self-pay | Admitting: Physician Assistant

## 2014-06-24 NOTE — Telephone Encounter (Signed)
Requesting Clonazepam 1mg -Take 1 tablet by mouth twice daily as needed. Last refill:05-07-14;#60,0 Last OV:05-21-14 No UDS Please advise.//AB/CMA

## 2014-06-25 ENCOUNTER — Other Ambulatory Visit: Payer: Self-pay | Admitting: Physician Assistant

## 2014-06-25 MED ORDER — CLONAZEPAM 1 MG PO TABS: 1.0000 mg | ORAL_TABLET | Freq: Two times a day (BID) | ORAL | Status: DC | PRN

## 2014-07-13 ENCOUNTER — Telehealth: Payer: Self-pay | Admitting: Physician Assistant

## 2014-07-13 ENCOUNTER — Ambulatory Visit: Payer: 59 | Admitting: Neurology

## 2014-07-13 DIAGNOSIS — M549 Dorsalgia, unspecified: Secondary | ICD-10-CM

## 2014-07-13 NOTE — Telephone Encounter (Signed)
Please advise.//AB/CMA 

## 2014-07-13 NOTE — Telephone Encounter (Signed)
Caller name: Jackelyn Poling with [facility] (Dr. Felecia Shelling - NPI: [id number]) Can be reached: [phone]   Reason for call: Pt needs referral for follow up back pain *[place]*. Pt has appt 07/19/14. Please send referral info. ICD-10 M54.41

## 2014-07-14 ENCOUNTER — Encounter: Payer: Self-pay | Admitting: Physician Assistant

## 2014-07-14 ENCOUNTER — Ambulatory Visit (INDEPENDENT_AMBULATORY_CARE_PROVIDER_SITE_OTHER): Payer: 59 | Admitting: Physician Assistant

## 2014-07-14 VITALS — BP 150/99 | HR 84 | Temp 97.5°F | Ht 76.0 in | Wt 267.4 lb

## 2014-07-14 DIAGNOSIS — G43809 Other migraine, not intractable, without status migrainosus: Secondary | ICD-10-CM | POA: Diagnosis not present

## 2014-07-14 MED ORDER — HYOSCYAMINE SULFATE 0.125 MG PO TABS: 0.1250 mg | ORAL_TABLET | Freq: Four times a day (QID) | ORAL | Status: DC | PRN

## 2014-07-14 MED ORDER — KETOROLAC TROMETHAMINE 30 MG/ML IJ SOLN
30.0000 mg | Freq: Once | INTRAMUSCULAR | Status: AC
  Administered 2014-07-14: 30 mg via INTRAMUSCULAR

## 2014-07-14 MED ORDER — TOPIRAMATE 50 MG PO TABS: 50.0000 mg | ORAL_TABLET | Freq: Two times a day (BID) | ORAL | Status: DC

## 2014-07-14 NOTE — Assessment & Plan Note (Signed)
30 mg IM toradol given with marked improvement of symptoms. Will begin Topamax titrating up to 50 mg BID. Continue current abortive therapy. Follow-up with Neurology on Monday as scheduled.

## 2014-07-14 NOTE — Telephone Encounter (Signed)
Referral placed.

## 2014-07-14 NOTE — Patient Instructions (Signed)
Please continue chronic medications as directed. Start the Topamax as follows:   - Take 1/2 tablet daily for 1 day.  Then take 1/2 tablet twice daily for 3 days.   - Increase to 1 tablet twice daily.  I have placed referral to Dr. Felecia Shelling for you. Please follow-up with him on Monday. Continue pain medication as directed for acute migraine.

## 2014-07-14 NOTE — Progress Notes (Signed)
Patient presents to clinic today c/o continued migraines despite multiple prophylactic therapies. Patient endorses migraine presently that started on waking this morning. Endorses photophobia and phonophobia.  Denies vision changes, N/V or AMS.  IS scheduled to see Neurology next Monday.  Past Medical History  Diagnosis Date  . Dyspnea   . History of pneumonia   . Hypercholesteremia   . Overweight(278.02)   . Diverticulosis of colon   . Irritable bowel syndrome (IBS)   . NAFLD (nonalcoholic fatty liver disease)   . Male hypogonadism   . LBP (low back pain)   . Chronic pain syndrome   . Anxiety   . Depression   . Chicken pox   . Diverticulitis of colon without hemorrhage 06/04/2013  . Headache   . Vision abnormalities     Current Outpatient Prescriptions on File Prior to Visit  Medication Sig Dispense Refill  . clonazePAM (KLONOPIN) 1 MG tablet Take 1 tablet (1 mg total) by mouth 2 (two) times daily as needed. 60 tablet 2  . DULoxetine (CYMBALTA) 60 MG capsule Take 1 capsule (60 mg total) by mouth 2 (two) times daily. 60 capsule 3  . HYDROcodone-acetaminophen (NORCO) 7.5-325 MG per tablet Take 1 tablet by mouth 3 (three) times daily as needed for moderate pain. 90 tablet 0  . methocarbamol (ROBAXIN) 500 MG tablet Take 500 mg by mouth 3 (three) times daily as needed for muscle spasms.    Marland Kitchen zolpidem (AMBIEN) 10 MG tablet Take 1 tablet (10 mg total) by mouth at bedtime as needed for sleep. 15 tablet 1   No current facility-administered medications on file prior to visit.    Allergies  Allergen Reactions  . Crestor [Rosuvastatin Calcium]     Severe leg cramping  . Sulfa Antibiotics     rash    Family History  Problem Relation Age of Onset  . Diabetes Maternal Grandfather   . Healthy Mother     Living  . Pneumonia Father 76    Deceased  . Bone cancer Paternal Grandfather   . Healthy Brother   . Healthy Son     x1  . Healthy Daughter     x2    History   Social  History  . Marital Status: Married    Spouse Name: Malachy Mood  . Number of Children: 3  . Years of Education: N/A   Occupational History  . purchasing    Social History Main Topics  . Smoking status: Never Smoker   . Smokeless tobacco: Never Used  . Alcohol Use: No  . Drug Use: No  . Sexual Activity: Not on file   Other Topics Concern  . None   Social History Narrative   Review of Systems - See HPI.  All other ROS are negative.  BP 150/99 mmHg  Pulse 84  Temp(Src) 97.5 F (36.4 C) (Oral)  Ht 6' 4"  (1.93 m)  Wt 267 lb 6.4 oz (121.292 kg)  BMI 32.56 kg/m2  SpO2 96%  Physical Exam  Constitutional: He is oriented to person, place, and time and well-developed, well-nourished, and in no distress.  HENT:  Head: Normocephalic and atraumatic.  Eyes: Conjunctivae are normal.  Neck: Neck supple.  Cardiovascular: Normal rate, regular rhythm, normal heart sounds and intact distal pulses.   Pulmonary/Chest: Effort normal and breath sounds normal. No respiratory distress. He has no wheezes. He has no rales. He exhibits no tenderness.  Neurological: He is alert and oriented to person, place, and time.  Skin: Skin  is warm and dry. No rash noted.  Psychiatric: Affect normal.  Vitals reviewed.   Recent Results (from the past 2160 hour(s))  CBC w/Diff     Status: Abnormal   Collection Time: 05/21/14  3:21 PM  Result Value Ref Range   WBC 8.8 4.0 - 10.5 K/uL   RBC 4.93 4.22 - 5.81 MIL/uL   Hemoglobin 14.8 13.0 - 17.0 g/dL   HCT 43.4 39.0 - 52.0 %   MCV 88.0 78.0 - 100.0 fL   MCH 30.0 26.0 - 34.0 pg   MCHC 34.1 30.0 - 36.0 g/dL   RDW 14.6 11.5 - 15.5 %   Platelets 408 (H) 150 - 400 K/uL   MPV 9.1 8.6 - 12.4 fL   Neutrophils Relative % 53 43 - 77 %   Neutro Abs 4.7 1.7 - 7.7 K/uL   Lymphocytes Relative 37 12 - 46 %   Lymphs Abs 3.3 0.7 - 4.0 K/uL   Monocytes Relative 7 3 - 12 %   Monocytes Absolute 0.6 0.1 - 1.0 K/uL   Eosinophils Relative 2 0 - 5 %   Eosinophils Absolute 0.2  0.0 - 0.7 K/uL   Basophils Relative 1 0 - 1 %   Basophils Absolute 0.1 0.0 - 0.1 K/uL   Smear Review Criteria for review not met   Basic Metabolic Panel (BMET)     Status: None   Collection Time: 05/21/14  3:21 PM  Result Value Ref Range   Sodium 137 135 - 145 mEq/L   Potassium 4.7 3.5 - 5.3 mEq/L   Chloride 102 96 - 112 mEq/L   CO2 27 19 - 32 mEq/L   Glucose, Bld 96 70 - 99 mg/dL   BUN 16 6 - 23 mg/dL   Creat 1.31 0.50 - 1.35 mg/dL   Calcium 9.6 8.4 - 10.5 mg/dL  TSH     Status: None   Collection Time: 05/21/14  3:21 PM  Result Value Ref Range   TSH 1.664 0.350 - 4.500 uIU/mL  Vitamin D 1,25 dihydroxy     Status: None   Collection Time: 05/21/14  3:21 PM  Result Value Ref Range   Vitamin D 1, 25 (OH)2 Total 39 18 - 72 pg/mL   Vitamin D3 1, 25 (OH)2 39 pg/mL   Vitamin D2 1, 25 (OH)2 <8 pg/mL    Comment: Vitamin D3, 1,25(OH)2 indicates both endogenous production and supplementation.  Vitamin D2, 1,25(OH)2 is an indicator of exogeous sources, such as diet or supplementation.  Interpretation and therapy are based on measurement of Vitamin D,1,25(OH)2, Total. This test was developed and its analytical performance characteristics have been determined by [facility], [place], [place]. It has not been cleared or approved by the FDA. This assay has been validated pursuant to the CLIA regulations and is used for clinical purposes.   B12     Status: None   Collection Time: 05/21/14  3:21 PM  Result Value Ref Range   Vitamin B-12 592 211 - 911 pg/mL  Testosterone     Status: Abnormal   Collection Time: 05/21/14  3:21 PM  Result Value Ref Range   Testosterone 184 (L) 300 - 890 ng/dL    Comment:           Tanner Stage       Male              Male               I              <  30 ng/dL        < 10 ng/dL               II             < 150 ng/dL       < 30 ng/dL               III            100-320 ng/dL     < 35 ng/dL               IV             200-970  ng/dL     15-40 ng/dL               V/Adult        300-890 ng/dL     10-70 ng/dL       Assessment/Plan: Other migraine without status migrainosus, not intractable 30 mg IM toradol given with marked improvement of symptoms. Will begin Topamax titrating up to 50 mg BID. Continue current abortive therapy. Follow-up with Neurology on Monday as scheduled.

## 2014-07-14 NOTE — Progress Notes (Signed)
Pre visit review using our clinic review tool, if applicable. No additional management support is needed unless otherwise documented below in the visit note. 

## 2014-07-19 ENCOUNTER — Ambulatory Visit: Payer: 59 | Admitting: Neurology

## 2014-07-19 ENCOUNTER — Other Ambulatory Visit: Payer: Self-pay | Admitting: *Deleted

## 2014-07-19 MED ORDER — HYDROCODONE-ACETAMINOPHEN 7.5-325 MG PO TABS: 1.0000 | ORAL_TABLET | Freq: Three times a day (TID) | ORAL | Status: DC | PRN

## 2014-07-19 NOTE — Telephone Encounter (Signed)
Pt. arrived at [phone] for a 9am appt.  He is being resched. and requested Hydrocodone rx. while he is here.  Per RAS,  ok for rx. for enough Hydrocodone to last until his appt. only.  Rx. printed, given directly to pt./fim

## 2014-07-20 ENCOUNTER — Encounter: Payer: Self-pay | Admitting: Neurology

## 2014-07-23 ENCOUNTER — Telehealth: Payer: Self-pay | Admitting: *Deleted

## 2014-07-26 MED ORDER — HYDROCODONE-ACETAMINOPHEN 7.5-325 MG PO TABS: 1.0000 | ORAL_TABLET | Freq: Three times a day (TID) | ORAL | Status: DC | PRN

## 2014-07-26 NOTE — Telephone Encounter (Signed)
LMOM (identified vm) that a new rx. for Hydrocodone will be up front [facility] for him to pick up/fim

## 2014-07-26 NOTE — Telephone Encounter (Signed)
Rx. printed, signed, up front [facility]/fim 

## 2014-07-28 ENCOUNTER — Ambulatory Visit: Payer: 59 | Admitting: Neurology

## 2014-08-02 ENCOUNTER — Ambulatory Visit: Payer: 59 | Admitting: Neurology

## 2014-08-20 ENCOUNTER — Encounter: Payer: Self-pay | Admitting: Physician Assistant

## 2014-08-20 ENCOUNTER — Ambulatory Visit (INDEPENDENT_AMBULATORY_CARE_PROVIDER_SITE_OTHER): Payer: 59 | Admitting: Physician Assistant

## 2014-08-20 VITALS — BP 124/80 | HR 94 | Temp 97.8°F | Ht 76.0 in | Wt 260.2 lb

## 2014-08-20 DIAGNOSIS — G43709 Chronic migraine without aura, not intractable, without status migrainosus: Secondary | ICD-10-CM

## 2014-08-20 DIAGNOSIS — IMO0002 Reserved for concepts with insufficient information to code with codable children: Secondary | ICD-10-CM | POA: Insufficient documentation

## 2014-08-20 MED ORDER — KETOROLAC TROMETHAMINE 60 MG/2ML IM SOLN
60.0000 mg | Freq: Once | INTRAMUSCULAR | Status: AC
  Administered 2014-08-20: 60 mg via INTRAMUSCULAR

## 2014-08-20 MED ORDER — PROPRANOLOL HCL 40 MG PO TABS: 40.0000 mg | ORAL_TABLET | Freq: Two times a day (BID) | ORAL | Status: DC

## 2014-08-20 NOTE — Addendum Note (Signed)
Addended by: Raiford Noble on: 08/20/2014 02:46 PM   Modules accepted: Orders

## 2014-08-20 NOTE — Assessment & Plan Note (Signed)
Im Toradol given in office. Continue Topamax. Will add on Inderal 40 mg BID. Continue home abortive therapy. Referral to new neurologist placed. Order for Ct Head placed.

## 2014-08-20 NOTE — Patient Instructions (Signed)
Please continue current medication regimen. Add-on the Propranolol twice daily as directed. You will be contacted to schedule a CT scan. You will also be contacted for appointments with Neurology and Gastroenterology. Stay well hydrated and begin a daily probiotic.  Follow-up with me in 3 weeks.

## 2014-08-20 NOTE — Progress Notes (Signed)
Patient presents to clinic today c/o continued migraines despite increase in Topamax. Endorses every other day migraines without aura. Still notes nausea and photophobia. Was instructed to follow-up with Neurology but was dismissed from practice due to multiple no-shows and late arrivals.  Will need new neurology referral.  Past Medical History  Diagnosis Date  . Dyspnea   . History of pneumonia   . Hypercholesteremia   . Overweight(278.02)   . Diverticulosis of colon   . Irritable bowel syndrome (IBS)   . NAFLD (nonalcoholic fatty liver disease)   . Male hypogonadism   . LBP (low back pain)   . Chronic pain syndrome   . Anxiety   . Depression   . Chicken pox   . Diverticulitis of colon without hemorrhage 06/04/2013  . Headache   . Vision abnormalities     Current Outpatient Prescriptions on File Prior to Visit  Medication Sig Dispense Refill  . clonazePAM (KLONOPIN) 1 MG tablet Take 1 tablet (1 mg total) by mouth 2 (two) times daily as needed. 60 tablet 2  . DULoxetine (CYMBALTA) 60 MG capsule Take 1 capsule (60 mg total) by mouth 2 (two) times daily. 60 capsule 3  . HYDROcodone-acetaminophen (NORCO) 7.5-325 MG per tablet Take 1 tablet by mouth 3 (three) times daily as needed for moderate pain. 90 tablet 0  . hyoscyamine (LEVSIN) 0.125 MG tablet Take 1 tablet (0.125 mg total) by mouth 4 (four) times daily as needed for cramping. 30 tablet 1  . topiramate (TOPAMAX) 50 MG tablet Take 1 tablet (50 mg total) by mouth 2 (two) times daily. 60 tablet 3  . methocarbamol (ROBAXIN) 500 MG tablet Take 500 mg by mouth 3 (three) times daily as needed for muscle spasms.    Marland Kitchen zolpidem (AMBIEN) 10 MG tablet Take 1 tablet (10 mg total) by mouth at bedtime as needed for sleep. 15 tablet 1   No current facility-administered medications on file prior to visit.    Allergies  Allergen Reactions  . Crestor [Rosuvastatin Calcium]     Severe leg cramping  . Sulfa Antibiotics     rash    Family  History  Problem Relation Age of Onset  . Diabetes Maternal Grandfather   . Healthy Mother     Living  . Pneumonia Father 48    Deceased  . Bone cancer Paternal Grandfather   . Healthy Brother   . Healthy Son     x1  . Healthy Daughter     x2    Social History   Social History  . Marital Status: Married    Spouse Name: Malachy Mood  . Number of Children: 3  . Years of Education: N/A   Occupational History  . purchasing    Social History Main Topics  . Smoking status: Never Smoker   . Smokeless tobacco: Never Used  . Alcohol Use: No  . Drug Use: No  . Sexual Activity: Not Asked   Other Topics Concern  . None   Social History Narrative    Review of Systems - See HPI.  All other ROS are negative.  BP 124/80 mmHg  Pulse 94  Temp(Src) 97.8 F (36.6 C) (Oral)  Ht 6\' 4"  (1.93 m)  Wt 260 lb 3.2 oz (118.026 kg)  BMI 31.69 kg/m2  SpO2 99%  Physical Exam  Constitutional: He is oriented to person, place, and time and well-developed, well-nourished, and in no distress.  HENT:  Head: Normocephalic and atraumatic.  Eyes: Conjunctivae are  normal.  Neck: Neck supple.  Cardiovascular: Normal rate, regular rhythm, normal heart sounds and intact distal pulses.   Pulmonary/Chest: Effort normal and breath sounds normal. No respiratory distress. He has no wheezes. He has no rales. He exhibits no tenderness.  Neurological: He is alert and oriented to person, place, and time.  Skin: Skin is warm and dry. No rash noted.  Psychiatric: Affect normal.  Vitals reviewed.   No results found for this or any previous visit (from the past 2160 hour(s)).  Assessment/Plan: Chronic migraine Im Toradol given in office. Continue Topamax. Will add on Inderal 40 mg BID. Continue home abortive therapy. Referral to new neurologist placed. Order for Ct Head placed.

## 2014-08-20 NOTE — Progress Notes (Signed)
Pre visit review using our clinic review tool, if applicable. No additional management support is needed unless otherwise documented below in the visit note. 

## 2014-08-21 ENCOUNTER — Other Ambulatory Visit (HOSPITAL_BASED_OUTPATIENT_CLINIC_OR_DEPARTMENT_OTHER): Payer: 59

## 2014-08-24 ENCOUNTER — Ambulatory Visit (HOSPITAL_COMMUNITY)
Admission: RE | Admit: 2014-08-24 | Discharge: 2014-08-24 | Disposition: A | Discharge status: Home / Self Care | Payer: 59 | Source: Ambulatory Visit | Attending: Physician Assistant | Admitting: Physician Assistant

## 2014-08-24 DIAGNOSIS — G43709 Chronic migraine without aura, not intractable, without status migrainosus: Secondary | ICD-10-CM | POA: Diagnosis not present

## 2014-08-24 DIAGNOSIS — IMO0002 Reserved for concepts with insufficient information to code with codable children: Secondary | ICD-10-CM

## 2014-08-25 ENCOUNTER — Telehealth: Payer: Self-pay | Admitting: Physician Assistant

## 2014-08-25 MED ORDER — PROMETHAZINE HCL 25 MG PO TABS: 25.0000 mg | ORAL_TABLET | Freq: Three times a day (TID) | ORAL | Status: DC | PRN

## 2014-08-25 NOTE — Telephone Encounter (Signed)
Order sent to [facility]

## 2014-08-25 NOTE — Telephone Encounter (Signed)
Pharmacy: [facility] on [address]  Reason for call: pt states that promethazine was supposed to be ordered from him following his appt 08/20/14 (for stomach issues).

## 2014-08-26 ENCOUNTER — Telehealth: Payer: Self-pay | Admitting: Physician Assistant

## 2014-08-26 NOTE — Telephone Encounter (Signed)
Pt calling for results from scans. Same ph #.

## 2014-08-26 NOTE — Telephone Encounter (Signed)
Spoke with the pt and he was informed of recent MRI results.//AB/CMA

## 2014-09-14 ENCOUNTER — Encounter: Payer: Self-pay | Admitting: Neurology

## 2014-09-14 ENCOUNTER — Ambulatory Visit (INDEPENDENT_AMBULATORY_CARE_PROVIDER_SITE_OTHER): Payer: 59 | Admitting: Neurology

## 2014-09-14 VITALS — BP 122/90 | HR 83 | Resp 18 | Ht 75.75 in | Wt 261.0 lb

## 2014-09-14 DIAGNOSIS — G44221 Chronic tension-type headache, intractable: Secondary | ICD-10-CM | POA: Diagnosis not present

## 2014-09-14 DIAGNOSIS — R200 Anesthesia of skin: Secondary | ICD-10-CM

## 2014-09-14 DIAGNOSIS — R202 Paresthesia of skin: Secondary | ICD-10-CM

## 2014-09-14 MED ORDER — PREDNISONE 10 MG PO TABS: ORAL_TABLET | ORAL | Status: DC

## 2014-09-14 MED ORDER — NORTRIPTYLINE HCL 25 MG PO CAPS: 25.0000 mg | ORAL_CAPSULE | Freq: Every day | ORAL | Status: DC

## 2014-09-14 NOTE — Patient Instructions (Addendum)
1.  Start nortriptyline 25mg  at bedtime.  Keep a headache diary.  Call in 4 weeks with update and we can adjust dose if needed. 2.  Stop propranolol.  Take topiramate 50mg  once daily for 7 days, then stop. 3.  To help break the headache, take prednisone taper.  Take 6tabs x1day, then 5tabs x1day, then 4tabs x1day, then 3tabs x1day, then 2tabs x1day, then 1tab x1day, then STOP 4.  Try to limit use of hydrocodone or over the counter pain relievers to no more than 2 days out of the week.  Try to avoid hydrocodone all together for back pain if possible.  Don't use it to treat headache. 5.  Consider naproxen 500mg  when the headache is severe. Do not take until after finishing prednisone taper. 6.  Start routine exercise.  Follow sleep hygiene 7.  Follow up in 3 months.  CALL IN 4 WEEKS WITH UPDATE.

## 2014-09-14 NOTE — Progress Notes (Signed)
NEUROLOGY CONSULTATION NOTE  Kurt Mcdonald MRN: [id number] DOB: 1965/03/13  Referring provider: Meriel Pica. Truitt Leep Primary care provider: Meriel Pica. Daphine Deutscher, PA-C  Reason for consult:  headache  HISTORY OF PRESENT ILLNESS: Kurt Mcdonald is a 49 year old right-handed male with male hypogonadism, chronic low back pain, IBS, OSA, anxiety, depression, headache and hypercholesterolemia who presents for migraine.  History obtained patient and PCP note.  Images of brain MRI from last month reviewed.  Onset:  4 months ago Location:  Bi-frontal or retro-orbital Quality:  Non throbbing, squeezing Intensity:  6/10 (may be worse at times) Aura:  no Prodrome:  no Associated symptoms:  Some photophobia or phonophobia.  No nausea or visual symptoms Duration:  constant Frequency:  constant Triggers/exacerbating factors:  stress Relieving factors:  nothing Activity:  2-3 days per week, needs to lay in bed  Also, for the past month or so, he notes numbness and tingling in his feet.  In May, B12 was in the 590s and TSH was 1.66.  Past abortive medication:  sumatriptan 50mg  Past preventative medication:  amitriptyline 10mg  (stopped for unknown reason)  Current abortive medication:  Hydrocodone, Excedrin, caffeinated beverage Antihypertensive medications:  Inderal 40mg  twice daily (started 8/19) Antidepressant medications:  Cymbalta 60mg  Anticonvulsant medications:  topiramate 50mg  twice daily (been on it for two months) Vitamins/Herbal/Supplements:  none Other therapy:  none Other medication:  Levsin, Ambien, Klonopin, Norco (chronic for back and sciatic pain)  MRI of brain without contrast from 08/25/14 was negative.  He also has chronic pain syndrome, with back pain and right sided sciatica.  He was previously treated by Dr. Despina Arias.  He takes Norco and etodolac.  Caffeine:  Not regularly Alcohol:  no Smoker:  no Diet:  Good.  Keeps hydrated. Exercise:  no Depression/stress:  Stress  and depression related to losing his job, his father passed away Sleep hygiene:  Poor.  He has untreated OSA because his insurance won't cover the CPAP device needed for his type of apnea. Family history of headache:  no  PAST MEDICAL HISTORY: Past Medical History  Diagnosis Date  . Dyspnea   . History of pneumonia   . Hypercholesteremia   . Overweight(278.02)   . Diverticulosis of colon   . Irritable bowel syndrome (IBS)   . NAFLD (nonalcoholic fatty liver disease)   . Male hypogonadism   . LBP (low back pain)   . Chronic pain syndrome   . Anxiety   . Depression   . Chicken pox   . Diverticulitis of colon without hemorrhage 06/04/2013  . Headache   . Vision abnormalities     PAST SURGICAL HISTORY: Past Surgical History  Procedure Laterality Date  . Shoulder sugery  01/2010    right  . Tooth extraction  11/2011  . Wisdom tooth extraction      MEDICATIONS: Current Outpatient Prescriptions on File Prior to Visit  Medication Sig Dispense Refill  . clonazePAM (KLONOPIN) 1 MG tablet Take 1 tablet (1 mg total) by mouth 2 (two) times daily as needed. 60 tablet 2  . DULoxetine (CYMBALTA) 60 MG capsule Take 1 capsule (60 mg total) by mouth 2 (two) times daily. (Patient taking differently: Take 60 mg by mouth daily. ) 60 capsule 3  . HYDROcodone-acetaminophen (NORCO) 7.5-325 MG per tablet Take 1 tablet by mouth 3 (three) times daily as needed for moderate pain. 90 tablet 0  . promethazine (PHENERGAN) 25 MG tablet Take 1 tablet (25 mg total) by mouth every 8 (eight) hours  as needed for nausea or vomiting. 20 tablet 0  . hyoscyamine (LEVSIN) 0.125 MG tablet Take 1 tablet (0.125 mg total) by mouth 4 (four) times daily as needed for cramping. (Patient not taking: Reported on 09/14/2014) 30 tablet 1  . zolpidem (AMBIEN) 10 MG tablet Take 1 tablet (10 mg total) by mouth at bedtime as needed for sleep. 15 tablet 1   No current facility-administered medications on file prior to visit.     ALLERGIES: Allergies  Allergen Reactions  . Crestor [Rosuvastatin Calcium]     Severe leg cramping  . Sulfa Antibiotics     rash    FAMILY HISTORY: Family History  Problem Relation Age of Onset  . Diabetes Maternal Grandfather   . Healthy Mother     Living  . Pneumonia Father 9    Deceased  . Bone cancer Paternal Grandfather   . Healthy Brother   . Healthy Son     x1  . Healthy Daughter     x2    SOCIAL HISTORY: Social History   Social History  . Marital Status: Married    Spouse Name: Elnita Maxwell  . Number of Children: 3  . Years of Education: N/A   Occupational History  . purchasing    Social History Main Topics  . Smoking status: Never Smoker   . Smokeless tobacco: Never Used  . Alcohol Use: 0.0 oz/week    0 Standard drinks or equivalent per week     Comment: Rare  . Drug Use: No  . Sexual Activity: Not on file   Other Topics Concern  . Not on file   Social History Narrative    REVIEW OF SYSTEMS: Constitutional: No fevers, chills, or sweats, no generalized fatigue, change in appetite Eyes: No visual changes, double vision, eye pain Ear, nose and throat: No hearing loss, ear pain, nasal congestion, sore throat Cardiovascular: No chest pain, palpitations Respiratory:  No shortness of breath at rest or with exertion, wheezes GastrointestinaI: No nausea, vomiting, diarrhea, abdominal pain, fecal incontinence Genitourinary:  No dysuria, urinary retention or frequency Musculoskeletal:  No neck pain, back pain Integumentary: No rash, pruritus, skin lesions Neurological: as above Psychiatric: No depression, insomnia, anxiety Endocrine: No palpitations, fatigue, diaphoresis, mood swings, change in appetite, change in weight, increased thirst Hematologic/Lymphatic:  No anemia, purpura, petechiae. Allergic/Immunologic: no itchy/runny eyes, nasal congestion, recent allergic reactions, rashes  PHYSICAL EXAM: Filed Vitals:   09/14/14 1248  BP: 122/90   Pulse: 83  Resp: 18   General: No acute distress.  Patient appears well-groomed.  Head:  Normocephalic/atraumatic Eyes:  fundi unremarkable, without vessel changes, exudates, hemorrhages or papilledema. Neck: supple, bilateral  paraspinal tenderness, full range of motion Back: bilateral paraspinal tenderness Heart: regular rate and rhythm Lungs: Clear to auscultation bilaterally. Vascular: No carotid bruits. Neurological Exam: Mental status: alert and oriented to person, place, and time, recent and remote memory intact, fund of knowledge intact, attention and concentration intact, speech fluent and not dysarthric, language intact. Cranial nerves: CN I: not tested CN II: pupils equal, round and reactive to light, visual fields intact, fundi unremarkable, without vessel changes, exudates, hemorrhages or papilledema. CN III, IV, VI:  full range of motion, no nystagmus, no ptosis CN V: facial sensation intact CN VII: upper and lower face symmetric CN VIII: hearing intact CN IX, X: gag intact, uvula midline CN XI: sternocleidomastoid and trapezius muscles intact CN XII: tongue midline Bulk & Tone: normal, no fasciculations. Motor:  5/5 throughout Sensation:  Reduced pinprick  sensation in feet.   Reduced vibration sensation in toe of right foot. Deep Tendon Reflexes:  2+ throughout, toes downgoing.  Finger to nose testing:  Without dysmetria.  Heel to shin:  Without dysmetria.  Gait:  Normal station and stride.  Able to turn and tandem walk. Romberg negative.  IMPRESSION: Chronic intractable tension-type headaches Numbness and tingling, may be symptoms of Topamax  PLAN: 1.  Will discontinue propranolol and taper off of topamax.  Instead, we will try nortriptyline 25mg  at bedtime.  He is to call in 4 weeks with update. 2.  Prednisone taper 3.  Limit use of pain relievers (especially hydrocodone) to no more than 2 days out of the week.  Try not to use hydrocodone for headache.  If  needed, try naproxen 500mg . 4.  Headache diary 5.  Sleep hygiene.   6.  Follow up in 3 months.  Thank you for allowing me to take part in the care of this patient.  Shon Millet, DO  CC:  Waldon Merl, PA-C

## 2014-09-23 ENCOUNTER — Telehealth: Payer: Self-pay

## 2014-09-23 NOTE — Telephone Encounter (Signed)
Patient called in today and stated that the has finished the Prednisone Taper but, that his headaches have come back. He states that he has had a constant headache that last all day with no relief from the Nortripyline. He states that he has stopped the Hydrocodone as asked but, is unsure of what to do now since headaches are worsening. Please Advise. [facility]

## 2014-09-24 NOTE — Telephone Encounter (Signed)
Spoke with pt. Pt. Advised and will call back with any questions or concerns. [facility]

## 2014-09-24 NOTE — Telephone Encounter (Signed)
The prednisone was an attempt to try and stop the headache immediately, but that is not always the case.  I have nothing to offer right now to immediately stop the headaches.  It is going to take time.  With the nortriptyline, we have to give each dose 4 weeks before we decide to make further changes.  I would advise using naproxen 500mg  to treat the headaches, but limited to no more than 2 days out of the week.  By cutting down on use of pain medications, it is expected that headaches may get worse before they get better because he has to force himself not to take anything.  But at this point, it is just going to take time.

## 2014-10-19 ENCOUNTER — Telehealth: Payer: Self-pay | Admitting: *Deleted

## 2014-10-19 NOTE — Telephone Encounter (Signed)
I would increase nortriptyline to 50mg  at bedtime and re-evaluate in 4 weeks.  He should not be taking any pain relievers more than 2 days out of the week.  He should not be taking hydrocodone.

## 2014-10-19 NOTE — Telephone Encounter (Signed)
Patient given instructions

## 2014-10-19 NOTE — Telephone Encounter (Signed)
Patient is still having headaches and medication is not working.  Please advise.  Patient in the lobby.

## 2014-10-27 ENCOUNTER — Ambulatory Visit: Payer: 59 | Admitting: Physician Assistant

## 2014-10-27 ENCOUNTER — Telehealth: Payer: Self-pay | Admitting: Physician Assistant

## 2014-10-27 ENCOUNTER — Other Ambulatory Visit: Payer: Self-pay | Admitting: Physician Assistant

## 2014-10-27 DIAGNOSIS — Z0289 Encounter for other administrative examinations: Secondary | ICD-10-CM

## 2014-10-27 NOTE — Telephone Encounter (Signed)
Pt will be no show today 10:30am. He just a call from school that 2 of his kids are sick and he has to go pick them up. Charge or no charge?

## 2014-10-27 NOTE — Telephone Encounter (Signed)
No charge. 

## 2014-10-27 NOTE — Telephone Encounter (Signed)
LMOM with contact name and number [for return call,  RE: and further provider

## 2014-10-27 NOTE — Telephone Encounter (Signed)
LMOM with contact name and number [for return call, if needed] RE: need to p/u at office this month per provider instructions/SLS

## 2014-10-28 ENCOUNTER — Other Ambulatory Visit: Payer: Self-pay | Admitting: Physician Assistant

## 2014-11-01 ENCOUNTER — Ambulatory Visit (INDEPENDENT_AMBULATORY_CARE_PROVIDER_SITE_OTHER): Payer: 59 | Admitting: Physician Assistant

## 2014-11-01 ENCOUNTER — Encounter: Payer: Self-pay | Admitting: Physician Assistant

## 2014-11-01 VITALS — BP 156/101 | HR 94 | Temp 98.1°F | Resp 16 | Ht 76.0 in | Wt 263.1 lb

## 2014-11-01 DIAGNOSIS — M539 Dorsopathy, unspecified: Secondary | ICD-10-CM

## 2014-11-01 DIAGNOSIS — F32A Depression, unspecified: Secondary | ICD-10-CM

## 2014-11-01 DIAGNOSIS — M5090 Cervical disc disorder, unspecified, unspecified cervical region: Secondary | ICD-10-CM

## 2014-11-01 DIAGNOSIS — F329 Major depressive disorder, single episode, unspecified: Secondary | ICD-10-CM

## 2014-11-01 DIAGNOSIS — F419 Anxiety disorder, unspecified: Secondary | ICD-10-CM

## 2014-11-01 DIAGNOSIS — F418 Other specified anxiety disorders: Secondary | ICD-10-CM

## 2014-11-01 MED ORDER — BUPROPION HCL ER (SR) 100 MG PO TB12: 100.0000 mg | ORAL_TABLET | Freq: Two times a day (BID) | ORAL | Status: DC

## 2014-11-01 MED ORDER — HYDROCODONE-ACETAMINOPHEN 7.5-325 MG PO TABS: 1.0000 | ORAL_TABLET | Freq: Three times a day (TID) | ORAL | Status: DC | PRN

## 2014-11-01 MED ORDER — KETOROLAC TROMETHAMINE 60 MG/2ML IM SOLN
60.0000 mg | Freq: Once | INTRAMUSCULAR | Status: AC
  Administered 2014-11-01: 60 mg via INTRAMUSCULAR

## 2014-11-01 NOTE — Progress Notes (Signed)
Pre visit review using our clinic review tool, if applicable. No additional management support is needed unless otherwise documented below in the visit note/SLS  

## 2014-11-01 NOTE — Progress Notes (Signed)
Patient presents to clinic today for follow-up regarding migraines. Has been evaluated by Neurology and placed on Pamelor, currently taking 50 mg nightly. Endorses improvement in migraines but they are still present, occurring daily still although less severe. Neurology has informed patient to avoid use of narcotic pain medication to see if this is contributing. Patient has stopped medications but is in severe pain due to his history of spinal injury with bulging discs and chronic pain.  Pain is affecting sleep. Patient no longer followed by Neurosurgery or pain management. Endorses last MRI > 5 years ago. Pain is worsening. Patient also endorses intermittent numbness in upper and lower extremities. Denies saddle anesthesia of change to bowel or bladder habits. Pain is worsening depression despite treatment for this. Patient states he is staying in bed most days due to severity of pain.   Past Medical History  Diagnosis Date  . Dyspnea   . History of pneumonia   . Hypercholesteremia   . Overweight(278.02)   . Diverticulosis of colon   . Irritable bowel syndrome (IBS)   . NAFLD (nonalcoholic fatty liver disease)   . Male hypogonadism   . LBP (low back pain)   . Chronic pain syndrome   . Anxiety   . Depression   . Chicken pox   . Diverticulitis of colon without hemorrhage 06/04/2013  . Headache   . Vision abnormalities     Current Outpatient Prescriptions on File Prior to Visit  Medication Sig Dispense Refill  . clonazePAM (KLONOPIN) 1 MG tablet TAKE 1 TABLET BY MOUTH TWICE DAILY AS NEEDED 60 tablet 0  . DULoxetine (CYMBALTA) 60 MG capsule Take 1 capsule (60 mg total) by mouth 2 (two) times daily. (Patient taking differently: Take 60 mg by mouth daily. ) 60 capsule 3  . hyoscyamine (LEVSIN) 0.125 MG tablet Take 1 tablet (0.125 mg total) by mouth 4 (four) times daily as needed for cramping. 30 tablet 1  . nortriptyline (PAMELOR) 25 MG capsule Take 1 capsule (25 mg total) by mouth at  bedtime. 30 capsule 3  . promethazine (PHENERGAN) 25 MG tablet Take 1 tablet (25 mg total) by mouth every 8 (eight) hours as needed for nausea or vomiting. 20 tablet 0  . zolpidem (AMBIEN) 10 MG tablet Take 1 tablet (10 mg total) by mouth at bedtime as needed for sleep. 15 tablet 1   No current facility-administered medications on file prior to visit.    Allergies  Allergen Reactions  . Crestor [Rosuvastatin Calcium]     Severe leg cramping  . Sulfa Antibiotics     rash    Family History  Problem Relation Age of Onset  . Diabetes Maternal Grandfather   . Healthy Mother     Living  . Pneumonia Father 48    Deceased  . Bone cancer Paternal Grandfather   . Healthy Brother   . Healthy Son     x1  . Healthy Daughter     x2    Social History   Social History  . Marital Status: Married    Spouse Name: Malachy Mood  . Number of Children: 3  . Years of Education: N/A   Occupational History  . purchasing    Social History Main Topics  . Smoking status: Never Smoker   . Smokeless tobacco: Never Used  . Alcohol Use: 0.0 oz/week    0 Standard drinks or equivalent per week     Comment: Rare  . Drug Use: No  . Sexual Activity: Not  Asked   Other Topics Concern  . None   Social History Narrative   Review of Systems - See HPI.  All other ROS are negative.  BP 156/101 mmHg  Pulse 94  Temp(Src) 98.1 F (36.7 C) (Oral)  Resp 16  Ht 6\' 4"  (1.93 m)  Wt 263 lb 2 oz (119.353 kg)  BMI 32.04 kg/m2  SpO2 96%  Physical Exam  Constitutional: He is oriented to person, place, and time and well-developed, well-nourished, and in no distress.  HENT:  Head: Normocephalic and atraumatic.  Eyes: Conjunctivae are normal.  Neck: Neck supple.  Cardiovascular: Normal rate, regular rhythm, normal heart sounds and intact distal pulses.   Pulmonary/Chest: Effort normal and breath sounds normal. No respiratory distress. He has no wheezes. He has no rales. He exhibits no tenderness.    Musculoskeletal: He exhibits tenderness.       Cervical back: He exhibits tenderness and pain. He exhibits no bony tenderness.       Thoracic back: He exhibits tenderness and pain. He exhibits no bony tenderness.       Lumbar back: He exhibits tenderness and pain. He exhibits no bony tenderness.  Neurological: He is alert and oriented to person, place, and time.  Skin: Skin is warm and dry. No rash noted.  Psychiatric:  Flat affect with depressed mood  Vitals reviewed.   No results found for this or any previous visit (from the past 2160 hour(s)).  Assessment/Plan: Multilevel degenerative disc disease Suspect this is present in Cervical, Thoracic and Lumbosacral spine due to severity and widespread distribution of pain. Concern for nerve compression, especially in cervical spine that is likely contributing to severe headaches. Giving prior history of OA and trauma, will obtain repeat MRIs to further assess. Hydrocodone refilled and referral to Neurosurgery will be placed pending MRI results.  Anxiety and depression Cymbalta 30 mg and Pamelor 50 mg. Will add-on Wellbutrin SR 100 mg twice daily. Follow-up in 2-3 weeks.

## 2014-11-01 NOTE — Patient Instructions (Signed)
Please restart pain medication as directed this evening. Continue the Nortiriptyline and the Cymbalta as directed.  Follow-up with Dr. Tomi Likens as scheduled.  You will be contacted for your MRI. I will call with results and we will expedite an appointment with Neurosurgery.  You will also be contacted for assessment by Pulmonology.  Let me know about insurance coverage regarding Nutrition

## 2014-11-06 ENCOUNTER — Ambulatory Visit (HOSPITAL_BASED_OUTPATIENT_CLINIC_OR_DEPARTMENT_OTHER)
Admission: RE | Admit: 2014-11-06 | Discharge: 2014-11-06 | Disposition: A | Discharge status: Home / Self Care | Payer: 59 | Source: Ambulatory Visit | Attending: Physician Assistant | Admitting: Physician Assistant

## 2014-11-06 DIAGNOSIS — M4806 Spinal stenosis, lumbar region: Secondary | ICD-10-CM | POA: Diagnosis not present

## 2014-11-06 DIAGNOSIS — M539 Dorsopathy, unspecified: Secondary | ICD-10-CM

## 2014-11-06 DIAGNOSIS — M5031 Other cervical disc degeneration,  high cervical region: Secondary | ICD-10-CM | POA: Insufficient documentation

## 2014-11-06 DIAGNOSIS — M545 Low back pain: Secondary | ICD-10-CM | POA: Insufficient documentation

## 2014-11-06 DIAGNOSIS — M5136 Other intervertebral disc degeneration, lumbar region: Secondary | ICD-10-CM | POA: Insufficient documentation

## 2014-11-06 DIAGNOSIS — M5134 Other intervertebral disc degeneration, thoracic region: Secondary | ICD-10-CM | POA: Insufficient documentation

## 2014-11-06 DIAGNOSIS — M5124 Other intervertebral disc displacement, thoracic region: Secondary | ICD-10-CM | POA: Diagnosis not present

## 2014-11-06 DIAGNOSIS — M4802 Spinal stenosis, cervical region: Secondary | ICD-10-CM | POA: Insufficient documentation

## 2014-11-06 DIAGNOSIS — M47814 Spondylosis without myelopathy or radiculopathy, thoracic region: Secondary | ICD-10-CM | POA: Diagnosis not present

## 2014-11-06 DIAGNOSIS — M5137 Other intervertebral disc degeneration, lumbosacral region: Secondary | ICD-10-CM | POA: Diagnosis not present

## 2014-11-06 DIAGNOSIS — M5090 Cervical disc disorder, unspecified, unspecified cervical region: Secondary | ICD-10-CM

## 2014-11-06 DIAGNOSIS — M542 Cervicalgia: Secondary | ICD-10-CM | POA: Diagnosis present

## 2014-11-09 ENCOUNTER — Telehealth: Payer: Self-pay | Admitting: Physician Assistant

## 2014-11-09 ENCOUNTER — Ambulatory Visit: Payer: 59 | Admitting: Physician Assistant

## 2014-11-09 DIAGNOSIS — M519 Unspecified thoracic, thoracolumbar and lumbosacral intervertebral disc disorder: Secondary | ICD-10-CM

## 2014-11-09 DIAGNOSIS — M48061 Spinal stenosis, lumbar region without neurogenic claudication: Secondary | ICD-10-CM

## 2014-11-09 DIAGNOSIS — M539 Dorsopathy, unspecified: Secondary | ICD-10-CM | POA: Insufficient documentation

## 2014-11-09 DIAGNOSIS — M4802 Spinal stenosis, cervical region: Secondary | ICD-10-CM

## 2014-11-09 NOTE — Telephone Encounter (Signed)
Note Copy & Pasted in Result note.

## 2014-11-09 NOTE — Assessment & Plan Note (Signed)
Suspect this is present in Cervical, Thoracic and Lumbosacral spine due to severity and widespread distribution of pain. Concern for nerve compression, especially in cervical spine that is likely contributing to severe headaches. Giving prior history of OA and trauma, will obtain repeat MRIs to further assess. Hydrocodone refilled and referral to Neurosurgery will be placed pending MRI results.

## 2014-11-09 NOTE — Assessment & Plan Note (Signed)
Cymbalta 30 mg and Pamelor 50 mg. Will add-on Wellbutrin SR 100 mg twice daily. Follow-up in 2-3 weeks.

## 2014-11-09 NOTE — Telephone Encounter (Signed)
Referral placed.

## 2014-11-09 NOTE — Telephone Encounter (Signed)
Caller name: Demondre Aguas   Relationship to patient: Self   Can be reached: [phone]  Reason for call: Pt is returning your call

## 2014-11-10 ENCOUNTER — Encounter: Payer: Self-pay | Admitting: Physician Assistant

## 2014-11-10 ENCOUNTER — Ambulatory Visit (INDEPENDENT_AMBULATORY_CARE_PROVIDER_SITE_OTHER): Payer: 59 | Admitting: Physician Assistant

## 2014-11-10 VITALS — BP 131/95 | HR 93 | Temp 98.0°F | Resp 18 | Ht 76.0 in | Wt 269.2 lb

## 2014-11-10 DIAGNOSIS — F329 Major depressive disorder, single episode, unspecified: Secondary | ICD-10-CM

## 2014-11-10 DIAGNOSIS — F418 Other specified anxiety disorders: Secondary | ICD-10-CM

## 2014-11-10 DIAGNOSIS — F419 Anxiety disorder, unspecified: Principal | ICD-10-CM

## 2014-11-10 DIAGNOSIS — Z23 Encounter for immunization: Secondary | ICD-10-CM

## 2014-11-10 DIAGNOSIS — M539 Dorsopathy, unspecified: Secondary | ICD-10-CM | POA: Diagnosis not present

## 2014-11-10 DIAGNOSIS — F32A Depression, unspecified: Secondary | ICD-10-CM

## 2014-11-10 MED ORDER — ZOLPIDEM TARTRATE 10 MG PO TABS: 10.0000 mg | ORAL_TABLET | Freq: Every evening | ORAL | Status: DC | PRN

## 2014-11-10 MED ORDER — DULOXETINE HCL 60 MG PO CPEP: 60.0000 mg | ORAL_CAPSULE | Freq: Every day | ORAL | Status: DC

## 2014-11-10 NOTE — Progress Notes (Signed)
Pre visit review using our clinic review tool, if applicable. No additional management support is needed unless otherwise documented below in the visit note/SLS  

## 2014-11-10 NOTE — Patient Instructions (Signed)
Please increase pain medication to four times daily. Continue other medications as directed. Call me in 1 week to let me know how symptoms are so we can make further changes.  Again call me if you have not received a call from Neurosurgery by Friday morning.  Please get your records (sleep study) so I can review.

## 2014-11-11 NOTE — Progress Notes (Signed)
Patient presents to clinic today for follow-up.  Recently had MRI complete spine due to chronic back pain and worsening headaches. MRI revealed stenosis and bulging discs at varying areas. Patient has been referred to Neurosurgery for further treatment. Patient following up regarding pain medication after regimen started. Endorses Norco as directed. Endorses some relief of pain but the pain remains severe. Denies saddle anesthesia or difficulty with bowel movements/urination.   Past Medical History  Diagnosis Date  . Dyspnea   . History of pneumonia   . Hypercholesteremia   . Overweight(278.02)   . Diverticulosis of colon   . Irritable bowel syndrome (IBS)   . NAFLD (nonalcoholic fatty liver disease)   . Male hypogonadism   . LBP (low back pain)   . Chronic pain syndrome   . Anxiety   . Depression   . Chicken pox   . Diverticulitis of colon without hemorrhage 06/04/2013  . Headache   . Vision abnormalities     Current Outpatient Prescriptions on File Prior to Visit  Medication Sig Dispense Refill  . buPROPion (WELLBUTRIN SR) 100 MG 12 hr tablet Take 1 tablet (100 mg total) by mouth 2 (two) times daily. 60 tablet 1  . clonazePAM (KLONOPIN) 1 MG tablet TAKE 1 TABLET BY MOUTH TWICE DAILY AS NEEDED 60 tablet 0  . HYDROcodone-acetaminophen (NORCO) 7.5-325 MG tablet Take 1 tablet by mouth 3 (three) times daily as needed for moderate pain. 90 tablet 0  . hyoscyamine (LEVSIN) 0.125 MG tablet Take 1 tablet (0.125 mg total) by mouth 4 (four) times daily as needed for cramping. 30 tablet 1  . nortriptyline (PAMELOR) 25 MG capsule Take 1 capsule (25 mg total) by mouth at bedtime. 30 capsule 3  . promethazine (PHENERGAN) 25 MG tablet Take 1 tablet (25 mg total) by mouth every 8 (eight) hours as needed for nausea or vomiting. 20 tablet 0   No current facility-administered medications on file prior to visit.    Allergies  Allergen Reactions  . Crestor [Rosuvastatin Calcium]     Severe leg  cramping  . Sulfa Antibiotics     rash    Family History  Problem Relation Age of Onset  . Diabetes Maternal Grandfather   . Healthy Mother     Living  . Pneumonia Father 11    Deceased  . Bone cancer Paternal Grandfather   . Healthy Brother   . Healthy Son     x1  . Healthy Daughter     x2    Social History   Social History  . Marital Status: Married    Spouse Name: Malachy Mood  . Number of Children: 3  . Years of Education: N/A   Occupational History  . purchasing    Social History Main Topics  . Smoking status: Never Smoker   . Smokeless tobacco: Never Used  . Alcohol Use: 0.0 oz/week    0 Standard drinks or equivalent per week     Comment: Rare  . Drug Use: No  . Sexual Activity: Not Asked   Other Topics Concern  . None   Social History Narrative   Review of Systems - See HPI.  All other ROS are negative.  BP 131/95 mmHg  Pulse 93  Temp(Src) 98 F (36.7 C) (Oral)  Resp 18  Ht 6\' 4"  (1.93 m)  Wt 269 lb 4 oz (122.131 kg)  BMI 32.79 kg/m2  SpO2 96%  Physical Exam  Constitutional: He is oriented to person, place, and time  and well-developed, well-nourished, and in no distress.  HENT:  Head: Normocephalic and atraumatic.  Eyes: Conjunctivae are normal.  Cardiovascular: Normal rate, regular rhythm, normal heart sounds and intact distal pulses.   Pulmonary/Chest: Effort normal and breath sounds normal. No respiratory distress. He has no wheezes. He has no rales. He exhibits no tenderness.  Neurological: He is alert and oriented to person, place, and time.  Skin: Skin is warm and dry. No rash noted.  Psychiatric: Affect normal.  Vitals reviewed.   No results found for this or any previous visit (from the past 2160 hour(s)).  Assessment/Plan: Multilevel degenerative disc disease With evidence of spinal stenosis and disc herniation noted on MRI. Again referral to Neurosurgery in place. Will increase frequency of Norco over the next week to see impact on  pain. Supportive measures reviewed. If anything worsens, patient to proceed to ER.

## 2014-11-11 NOTE — Assessment & Plan Note (Signed)
With evidence of spinal stenosis and disc herniation noted on MRI. Again referral to Neurosurgery in place. Will increase frequency of Norco over the next week to see impact on pain. Supportive measures reviewed. If anything worsens, patient to proceed to ER.

## 2014-11-12 ENCOUNTER — Telehealth: Payer: Self-pay | Admitting: Physician Assistant

## 2014-11-12 NOTE — Telephone Encounter (Signed)
Caller name: Self  Can be reached: [phone] Pharmacy:  Reason for call: Has not heard from [facility]. Plse

## 2014-11-12 NOTE — Telephone Encounter (Signed)
Marj please check on status of referral. Need to call specialists to expedite appointment

## 2014-11-30 ENCOUNTER — Other Ambulatory Visit: Payer: Self-pay | Admitting: Physician Assistant

## 2014-11-30 NOTE — Telephone Encounter (Signed)
Rx faxed to pharmacy/[facility] 

## 2014-12-03 ENCOUNTER — Encounter: Payer: Self-pay | Admitting: Physician Assistant

## 2014-12-03 ENCOUNTER — Ambulatory Visit (INDEPENDENT_AMBULATORY_CARE_PROVIDER_SITE_OTHER): Payer: 59 | Admitting: Physician Assistant

## 2014-12-03 VITALS — BP 132/93 | HR 112 | Temp 97.9°F | Resp 18 | Ht 76.0 in | Wt 268.0 lb

## 2014-12-03 DIAGNOSIS — M539 Dorsopathy, unspecified: Secondary | ICD-10-CM

## 2014-12-03 MED ORDER — BUPRENORPHINE 15 MCG/HR TD PTWK: 1.0000 | MEDICATED_PATCH | TRANSDERMAL | Status: DC

## 2014-12-03 MED ORDER — OXYCODONE HCL 10 MG PO TABS: 10.0000 mg | ORAL_TABLET | Freq: Three times a day (TID) | ORAL | Status: DC | PRN

## 2014-12-03 NOTE — Patient Instructions (Signed)
Please stop the Hydrocodone and start the new medications as directed.  Follow the bowel regimen below to keep bowels moving. You will be contacted by pain management for further management.  I encourage you to increase hydration and the amount of fiber in your diet.  Start a daily probiotic (Align, Culturelle, Digestive Advantage, etc.). If no bowel movement within 24 hours, take 2 Tbs of Milk of Magnesia in a 4 oz glass of warmed prune juice every 2-3 days to help promote bowel movement. If no results within 24 hours, then repeat above regimen, adding a Dulcolax stool softener to regimen. If this does not promote a bowel movement, please call the office.

## 2014-12-03 NOTE — Progress Notes (Signed)
Pre visit review using our clinic review tool, if applicable. No additional management support is needed unless otherwise documented below in the visit note/SLS  

## 2014-12-05 NOTE — Progress Notes (Signed)
Patient presents to clinic today c/o continued severe back pain of multiple levels secondary to DJD and herniated discs of spine. Patient has been evaluated by Neurosurgery who wishes to defer surgical intervention at present. Suggested Pain Clinic but did not place referral for patient.  Past Medical History  Diagnosis Date  . Dyspnea   . History of pneumonia   . Hypercholesteremia   . Overweight(278.02)   . Diverticulosis of colon   . Irritable bowel syndrome (IBS)   . NAFLD (nonalcoholic fatty liver disease)   . Male hypogonadism   . LBP (low back pain)   . Chronic pain syndrome   . Anxiety   . Depression   . Chicken pox   . Diverticulitis of colon without hemorrhage 06/04/2013  . Headache   . Vision abnormalities     Current Outpatient Prescriptions on File Prior to Visit  Medication Sig Dispense Refill  . buPROPion (WELLBUTRIN SR) 100 MG 12 hr tablet Take 1 tablet (100 mg total) by mouth 2 (two) times daily. 60 tablet 1  . clonazePAM (KLONOPIN) 1 MG tablet TAKE 1 TABLET BY MOUTH TWICE DAILY AS NEEDED 60 tablet 1  . DULoxetine (CYMBALTA) 60 MG capsule Take 1 capsule (60 mg total) by mouth daily. 60 capsule 3  . hyoscyamine (LEVSIN) 0.125 MG tablet Take 1 tablet (0.125 mg total) by mouth 4 (four) times daily as needed for cramping. 30 tablet 1  . nortriptyline (PAMELOR) 25 MG capsule Take 1 capsule (25 mg total) by mouth at bedtime. (Patient taking differently: Take 50 mg by mouth at bedtime. ) 30 capsule 3  . promethazine (PHENERGAN) 25 MG tablet Take 1 tablet (25 mg total) by mouth every 8 (eight) hours as needed for nausea or vomiting. 20 tablet 0  . zolpidem (AMBIEN) 10 MG tablet Take 1 tablet (10 mg total) by mouth at bedtime as needed for sleep. 15 tablet 1   No current facility-administered medications on file prior to visit.    Allergies  Allergen Reactions  . Crestor [Rosuvastatin Calcium]     Severe leg cramping  . Sulfa Antibiotics     rash    Family  History  Problem Relation Age of Onset  . Diabetes Maternal Grandfather   . Healthy Mother     Living  . Pneumonia Father 6    Deceased  . Bone cancer Paternal Grandfather   . Healthy Brother   . Healthy Son     x1  . Healthy Daughter     x2    Social History   Social History  . Marital Status: Married    Spouse Name: Malachy Mood  . Number of Children: 3  . Years of Education: N/A   Occupational History  . purchasing    Social History Main Topics  . Smoking status: Never Smoker   . Smokeless tobacco: Never Used  . Alcohol Use: 0.0 oz/week    0 Standard drinks or equivalent per week     Comment: Rare  . Drug Use: No  . Sexual Activity: Not Asked   Other Topics Concern  . None   Social History Narrative    Review of Systems - See HPI.  All other ROS are negative.  BP 132/93 mmHg  Pulse 112  Temp(Src) 97.9 F (36.6 C) (Oral)  Resp 18  Ht 6\' 4"  (1.93 m)  Wt 268 lb (121.564 kg)  BMI 32.64 kg/m2  SpO2 98%  Physical Exam  Constitutional: He is oriented to person,  place, and time and well-developed, well-nourished, and in no distress.  HENT:  Head: Normocephalic and atraumatic.  Eyes: Conjunctivae are normal. Pupils are equal, round, and reactive to light.  Cardiovascular: Normal rate, regular rhythm, normal heart sounds and intact distal pulses.   Pulmonary/Chest: Effort normal and breath sounds normal. No respiratory distress. He has no wheezes. He has no rales. He exhibits no tenderness.  Neurological: He is alert and oriented to person, place, and time.  Skin: Skin is warm and dry. No rash noted.  Psychiatric: Affect normal.  Vitals reviewed.  No results found for this or any previous visit (from the past 2160 hour(s)).  Assessment/Plan: Multilevel degenerative disc disease With significant chronic pain. Neurosurgery has evaluated and wishes to postpone surgery. Patient distraught by this as I can understand due to the level of pain and impact on his daily  life. Referral to Pain Management placed. Will do my best to expedite this but patient is aware this can take some time. Will stop current regimen. Begin Butrans 15 mcg/hr patch. Rx Oxycodone up to TID prn as needed. Follow-up 1 month. Constipation discussed as a potential side effect. Regimen given.

## 2014-12-05 NOTE — Assessment & Plan Note (Signed)
With significant chronic pain. Neurosurgery has evaluated and wishes to postpone surgery. Patient distraught by this as I can understand due to the level of pain and impact on his daily life. Referral to Pain Management placed. Will do my best to expedite this but patient is aware this can take some time. Will stop current regimen. Begin Butrans 15 mcg/hr patch. Rx Oxycodone up to TID prn as needed. Follow-up 1 month. Constipation discussed as a potential side effect. Regimen given.

## 2014-12-21 ENCOUNTER — Telehealth: Payer: Self-pay | Admitting: *Deleted

## 2014-12-21 NOTE — Telephone Encounter (Signed)
Received fax from [employer] giving Approval for [employer], faxed approval to [employer] and [facility] with contact name and number to patient to inform/SLS

## 2014-12-21 NOTE — Telephone Encounter (Signed)
PA initiated through [facility]). Awaiting determination. JG//CMA

## 2014-12-28 ENCOUNTER — Ambulatory Visit: Payer: 59 | Admitting: Neurology

## 2014-12-28 ENCOUNTER — Other Ambulatory Visit: Payer: Self-pay | Admitting: Neurology

## 2015-01-14 ENCOUNTER — Ambulatory Visit (INDEPENDENT_AMBULATORY_CARE_PROVIDER_SITE_OTHER): Payer: BLUE CROSS/BLUE SHIELD | Admitting: Physician Assistant

## 2015-01-14 ENCOUNTER — Encounter: Payer: Self-pay | Admitting: Physician Assistant

## 2015-01-14 VITALS — BP 138/95 | HR 117 | Temp 98.4°F | Ht 76.0 in | Wt 270.4 lb

## 2015-01-14 DIAGNOSIS — M539 Dorsopathy, unspecified: Secondary | ICD-10-CM

## 2015-01-14 MED ORDER — OXYCODONE HCL 10 MG PO TABS: 10.0000 mg | ORAL_TABLET | Freq: Three times a day (TID) | ORAL | Status: DC | PRN

## 2015-01-14 MED ORDER — MORPHINE SULFATE ER 20 MG PO CP24: 20.0000 mg | ORAL_CAPSULE | Freq: Every day | ORAL | Status: DC

## 2015-01-14 NOTE — Progress Notes (Signed)
Patient presents to clinic today for follow-up regarding medications for chronic pain syndrome. At last visit was started on Butrans patch. Patient states the medication made him feel jittery with worsening of anxiety. He stopped the medication but has continued his Oxycodone 10 mg TID with some relief but pain is still out of control. Has not heard from pain management referral placed at last visit. Is scheduled for follow-up with Neurosurgery next month. Previously it was decided by Neurosurgery to hold off on any surgical interventions.  Past Medical History  Diagnosis Date  . Dyspnea   . History of pneumonia   . Hypercholesteremia   . Overweight(278.02)   . Diverticulosis of colon   . Irritable bowel syndrome (IBS)   . NAFLD (nonalcoholic fatty liver disease)   . Male hypogonadism   . LBP (low back pain)   . Chronic pain syndrome   . Anxiety   . Depression   . Chicken pox   . Diverticulitis of colon without hemorrhage 06/04/2013  . Headache   . Vision abnormalities     Current Outpatient Prescriptions on File Prior to Visit  Medication Sig Dispense Refill  . buPROPion (WELLBUTRIN SR) 100 MG 12 hr tablet Take 1 tablet (100 mg total) by mouth 2 (two) times daily. 60 tablet 1  . clonazePAM (KLONOPIN) 1 MG tablet TAKE 1 TABLET BY MOUTH TWICE DAILY AS NEEDED 60 tablet 1  . DULoxetine (CYMBALTA) 60 MG capsule Take 1 capsule (60 mg total) by mouth daily. 60 capsule 3  . hyoscyamine (LEVSIN) 0.125 MG tablet Take 1 tablet (0.125 mg total) by mouth 4 (four) times daily as needed for cramping. 30 tablet 1  . nortriptyline (PAMELOR) 25 MG capsule TAKE 1 CAPSULE(25 MG) BY MOUTH AT BEDTIME 30 capsule 0  . promethazine (PHENERGAN) 25 MG tablet Take 1 tablet (25 mg total) by mouth every 8 (eight) hours as needed for nausea or vomiting. 20 tablet 0  . zolpidem (AMBIEN) 10 MG tablet Take 1 tablet (10 mg total) by mouth at bedtime as needed for sleep. 15 tablet 1   No current  facility-administered medications on file prior to visit.    Allergies  Allergen Reactions  . Butrans [Buprenorphine]     Felt funny  . Crestor [Rosuvastatin Calcium]     Severe leg cramping  . Sulfa Antibiotics     rash    Family History  Problem Relation Age of Onset  . Diabetes Maternal Grandfather   . Healthy Mother     Living  . Pneumonia Father 8    Deceased  . Bone cancer Paternal Grandfather   . Healthy Brother   . Healthy Son     x1  . Healthy Daughter     x2    Social History   Social History  . Marital Status: Married    Spouse Name: Malachy Mood  . Number of Children: 3  . Years of Education: N/A   Occupational History  . purchasing    Social History Main Topics  . Smoking status: Never Smoker   . Smokeless tobacco: Never Used  . Alcohol Use: 0.0 oz/week    0 Standard drinks or equivalent per week     Comment: Rare  . Drug Use: No  . Sexual Activity: Not Asked   Other Topics Concern  . None   Social History Narrative   Review of Systems - See HPI.  All other ROS are negative.  BP 138/95 mmHg  Pulse 117  Temp(Src) 98.4 F (36.9 C) (Oral)  Ht 6\' 4"  (1.93 m)  Wt 270 lb 6.4 oz (122.653 kg)  BMI 32.93 kg/m2  SpO2 97%  Physical Exam  Constitutional: He is oriented to person, place, and time and well-developed, well-nourished, and in no distress.  HENT:  Head: Normocephalic and atraumatic.  Cardiovascular: Regular rhythm, normal heart sounds and intact distal pulses.   Mild tachycardia   Pulmonary/Chest: Effort normal and breath sounds normal. No respiratory distress. He has no wheezes. He has no rales. He exhibits no tenderness.  Neurological: He is alert and oriented to person, place, and time.  Skin: Skin is warm and dry. No rash noted.  Psychiatric: Affect normal.  Vitals reviewed.  No results found for this or any previous visit (from the past 2160 hour(s)).  Assessment/Plan: Multilevel degenerative disc disease With chronic pain.  Pain clinic referral changed to [facility] to expedite an appointment. Butrans discontinued. Will begin Morphine ER daily. Continue Oxycodone. Will follow-up 3-4 weeks.

## 2015-01-14 NOTE — Patient Instructions (Signed)
Please start the new medications as directed. Stay well hydrated. Do not skip meals.  I have sent a message to my coordinators to work on your pain referral to get you in soon for assessment and other methods of treatment.  Follow-up with me in 3-4 weeks.

## 2015-01-14 NOTE — Assessment & Plan Note (Signed)
With chronic pain. Pain clinic referral changed to [facility] to expedite an appointment. Butrans discontinued. Will begin Morphine ER daily. Continue Oxycodone. Will follow-up 3-4 weeks.

## 2015-01-14 NOTE — Progress Notes (Signed)
Pre visit review using our clinic review tool, if applicable. No additional management support is needed unless otherwise documented below in the visit note. 

## 2015-01-16 ENCOUNTER — Other Ambulatory Visit: Payer: Self-pay | Admitting: Physician Assistant

## 2015-01-18 ENCOUNTER — Telehealth: Payer: Self-pay | Admitting: Physician Assistant

## 2015-01-18 MED ORDER — BUPROPION HCL ER (SR) 100 MG PO TB12: ORAL_TABLET | ORAL | Status: DC

## 2015-01-18 NOTE — Telephone Encounter (Signed)
Reviewed chart. Was sent on 01/16/15 and confirmed received by pharmacy. I have resent today so hopefully this time the pharmacy will not lose it.

## 2015-01-18 NOTE — Telephone Encounter (Signed)
Relation to [id number] Call back [phone] Pharmacy: [id number] - [place], [place] - [address] AT [address] [phone] (Phone) [phone] (Fax)         Reason for call:  Patient states as of today pharmacy never received buPROPion (WELLBUTRIN SR) 100 MG 12 hr tablet

## 2015-02-03 ENCOUNTER — Other Ambulatory Visit: Payer: Self-pay | Admitting: Physician Assistant

## 2015-02-09 ENCOUNTER — Other Ambulatory Visit: Payer: Self-pay | Admitting: Neurology

## 2015-02-10 NOTE — Telephone Encounter (Signed)
Last OV: 09/14/14 Next OV: 0/0/0 Message sent on RX that patient needs to schedule an appointment.   I would increase nortriptyline to 50mg  at bedtime and re-evaluate in 4 weeks.  Kurt Mcdonald should not be taking any pain relievers more than 2 days out of the week.  Kurt Mcdonald should not be taking hydrocodone.

## 2015-02-16 ENCOUNTER — Other Ambulatory Visit: Payer: Self-pay | Admitting: Neurology

## 2015-03-02 ENCOUNTER — Ambulatory Visit (INDEPENDENT_AMBULATORY_CARE_PROVIDER_SITE_OTHER): Payer: BLUE CROSS/BLUE SHIELD | Admitting: Physician Assistant

## 2015-03-02 ENCOUNTER — Encounter: Payer: Self-pay | Admitting: Physician Assistant

## 2015-03-02 VITALS — BP 138/87 | HR 111 | Temp 98.4°F | Ht 76.0 in | Wt 275.6 lb

## 2015-03-02 DIAGNOSIS — K5730 Diverticulosis of large intestine without perforation or abscess without bleeding: Secondary | ICD-10-CM

## 2015-03-02 DIAGNOSIS — R0681 Apnea, not elsewhere classified: Secondary | ICD-10-CM | POA: Diagnosis not present

## 2015-03-02 DIAGNOSIS — B370 Candidal stomatitis: Secondary | ICD-10-CM | POA: Diagnosis not present

## 2015-03-02 MED ORDER — NYSTATIN 100000 UNIT/ML MT SUSP: 5.0000 mL | Freq: Four times a day (QID) | OROMUCOSAL | Status: DC

## 2015-03-02 NOTE — Progress Notes (Signed)
Patient presents to clinic today c/o 4 days of sore throat associated with swollen/tender glands. Endorses some mild chest congestion and cough that has resolved. Denies fever, chills or aches. Children have had colds recently.   Patient also following up regarding chronic low back pain. Is taking the Morphine ER as directed which he states is helping with pain. Has seen CPS specialists and medications have been taken over by these specialist. Is scheduled for nerve block.   Patient requesting new referral to Pulmonary for apnea assessment. Was referred previously but canceled due to other health issues. Would like referral back to GI for diverticulosis as well.  Past Medical History  Diagnosis Date  . Dyspnea   . History of pneumonia   . Hypercholesteremia   . Overweight(278.02)   . Diverticulosis of colon   . Irritable bowel syndrome (IBS)   . NAFLD (nonalcoholic fatty liver disease)   . Male hypogonadism   . LBP (low back pain)   . Chronic pain syndrome   . Anxiety   . Depression   . Chicken pox   . Diverticulitis of colon without hemorrhage 06/04/2013  . Headache   . Vision abnormalities     Current Outpatient Prescriptions on File Prior to Visit  Medication Sig Dispense Refill  . buPROPion (WELLBUTRIN SR) 100 MG 12 hr tablet TAKE 1 TABLET(100 MG) BY MOUTH TWICE DAILY 60 tablet 5  . clonazePAM (KLONOPIN) 1 MG tablet TAKE 1 TABLET BY MOUTH TWICE DAILY AS NEEDED 60 tablet 3  . DULoxetine (CYMBALTA) 60 MG capsule Take 1 capsule (60 mg total) by mouth daily. 60 capsule 3  . hyoscyamine (LEVSIN) 0.125 MG tablet Take 1 tablet (0.125 mg total) by mouth 4 (four) times daily as needed for cramping. 30 tablet 1  . nortriptyline (PAMELOR) 50 MG capsule Take 1 capsule (50mg ) by mouth at bed time. SCHEDULE FOLLOW UP W/ DR. Tomi Likens!!!! 30 capsule 0  . Oxycodone HCl 10 MG TABS Take 1 tablet (10 mg total) by mouth 3 (three) times daily as needed. 90 tablet 0  . promethazine (PHENERGAN) 25 MG  tablet Take 1 tablet (25 mg total) by mouth every 8 (eight) hours as needed for nausea or vomiting. 20 tablet 0  . zolpidem (AMBIEN) 10 MG tablet Take 1 tablet (10 mg total) by mouth at bedtime as needed for sleep. 15 tablet 1   No current facility-administered medications on file prior to visit.    Allergies  Allergen Reactions  . Butrans [Buprenorphine]     Felt funny  . Crestor [Rosuvastatin Calcium]     Severe leg cramping  . Sulfa Antibiotics     rash    Family History  Problem Relation Age of Onset  . Diabetes Maternal Grandfather   . Healthy Mother     Living  . Pneumonia Father 62    Deceased  . Bone cancer Paternal Grandfather   . Healthy Brother   . Healthy Son     x1  . Healthy Daughter     x2    Social History   Social History  . Marital Status: Married    Spouse Name: Malachy Mood  . Number of Children: 3  . Years of Education: N/A   Occupational History  . purchasing    Social History Main Topics  . Smoking status: Never Smoker   . Smokeless tobacco: Never Used  . Alcohol Use: 0.0 oz/week    0 Standard drinks or equivalent per week  Comment: Rare  . Drug Use: No  . Sexual Activity: Not Asked   Other Topics Concern  . None   Social History Narrative  - Review of Systems - See HPI.  All other ROS are negative.  BP 138/87 mmHg  Pulse 111  Temp(Src) 98.4 F (36.9 C) (Oral)  Ht 6\' 4"  (1.93 m)  Wt 275 lb 9.6 oz (125.011 kg)  BMI 33.56 kg/m2  SpO2 98%  Physical Exam  Constitutional: He is oriented to person, place, and time and well-developed, well-nourished, and in no distress.  HENT:  Head: Normocephalic and atraumatic.  Right Ear: Tympanic membrane normal.  Left Ear: Tympanic membrane normal.  Nose: Nose normal.  Mouth/Throat: Uvula is midline.    Eyes: Conjunctivae are normal.  Cardiovascular: Normal rate, regular rhythm, normal heart sounds and intact distal pulses.   Pulmonary/Chest: Effort normal and breath sounds normal. No  respiratory distress. He has no wheezes. He has no rales. He exhibits no tenderness.  Abdominal: Soft. Bowel sounds are normal. He exhibits no distension and no mass. There is no tenderness. There is no rebound and no guarding.  Neurological: He is alert and oriented to person, place, and time.  Skin: Skin is warm and dry. No rash noted.  Psychiatric: Affect normal.  Vitals reviewed.   No results found for this or any previous visit (from the past 2160 hour(s)).  Assessment/Plan: Oral thrush Rx Nystatin suspension. Swish and swallow. Supportive measures reviewed.   Referrals to GI and Pulmonology placed.

## 2015-03-02 NOTE — Assessment & Plan Note (Signed)
Rx Nystatin suspension. Swish and swallow. Supportive measures reviewed.

## 2015-03-02 NOTE — Patient Instructions (Signed)
Please use the Nystatin mouthwash as directed -- swish and swallow. Increase fluids. Place a humidifier in the bedroom. Take chronic medications as directed.  Follow-up if symptoms are not resolving.

## 2015-03-02 NOTE — Progress Notes (Signed)
Pre visit review using our clinic review tool, if applicable. No additional management support is needed unless otherwise documented below in the visit note. 

## 2015-03-21 ENCOUNTER — Other Ambulatory Visit: Payer: Self-pay | Admitting: Neurology

## 2015-03-21 ENCOUNTER — Telehealth: Payer: Self-pay | Admitting: Physician Assistant

## 2015-03-21 NOTE — Telephone Encounter (Signed)
Pt is requesting a refill on 2 medications.   1.Oxycodone 2.Morphine  CB: [id number]

## 2015-03-21 NOTE — Telephone Encounter (Signed)
Pain specialist should be taking over all refills of medication per discussion at last visit. He needs to contact them for refills.

## 2015-03-21 NOTE — Telephone Encounter (Signed)
Called and spoke with the pt and informed him of the note below.  Pt verbalized understanding and agreed and stated that he will give the pain specialist a call.//AB/CMA

## 2015-03-21 NOTE — Telephone Encounter (Signed)
Last OV: 9/0/16 Next OV: 00/00/00  PT needs appiontment. Message written on last 2 refills. Refill denied.

## 2015-03-28 ENCOUNTER — Other Ambulatory Visit: Payer: Self-pay | Admitting: Neurology

## 2015-03-29 NOTE — Telephone Encounter (Signed)
Last OV: 09/17/14 Next OV: 0/0/0  Noshowed December appointment. Not rescheduled. 1 month w/ note already given. Denied once. Will deny again. NO return visit on file.

## 2015-04-20 ENCOUNTER — Encounter: Payer: Self-pay | Admitting: Gastroenterology

## 2015-04-20 NOTE — Telephone Encounter (Signed)
A user error has taken place.

## 2015-04-21 ENCOUNTER — Ambulatory Visit: Payer: BLUE CROSS/BLUE SHIELD | Admitting: Gastroenterology

## 2015-04-27 ENCOUNTER — Other Ambulatory Visit: Payer: Self-pay | Admitting: Physician Assistant

## 2015-05-02 ENCOUNTER — Encounter: Payer: Self-pay | Admitting: Pulmonary Disease

## 2015-05-02 ENCOUNTER — Ambulatory Visit (INDEPENDENT_AMBULATORY_CARE_PROVIDER_SITE_OTHER): Payer: BLUE CROSS/BLUE SHIELD | Admitting: Pulmonary Disease

## 2015-05-02 ENCOUNTER — Encounter (INDEPENDENT_AMBULATORY_CARE_PROVIDER_SITE_OTHER): Payer: Self-pay

## 2015-05-02 DIAGNOSIS — G4730 Sleep apnea, unspecified: Secondary | ICD-10-CM | POA: Diagnosis not present

## 2015-05-02 DIAGNOSIS — J4520 Mild intermittent asthma, uncomplicated: Secondary | ICD-10-CM | POA: Diagnosis not present

## 2015-05-02 DIAGNOSIS — E669 Obesity, unspecified: Secondary | ICD-10-CM | POA: Diagnosis not present

## 2015-05-02 DIAGNOSIS — R0609 Other forms of dyspnea: Secondary | ICD-10-CM | POA: Insufficient documentation

## 2015-05-02 DIAGNOSIS — J45909 Unspecified asthma, uncomplicated: Secondary | ICD-10-CM | POA: Insufficient documentation

## 2015-05-02 DIAGNOSIS — G4733 Obstructive sleep apnea (adult) (pediatric): Secondary | ICD-10-CM | POA: Diagnosis not present

## 2015-05-02 DIAGNOSIS — R0600 Dyspnea, unspecified: Secondary | ICD-10-CM | POA: Insufficient documentation

## 2015-05-02 MED ORDER — ALBUTEROL SULFATE HFA 108 (90 BASE) MCG/ACT IN AERS
2.0000 | INHALATION_SPRAY | RESPIRATORY_TRACT | Status: DC | PRN
Start: 2015-05-02 — End: 2020-03-01

## 2015-05-02 NOTE — Progress Notes (Signed)
Subjective:    Patient ID: Kurt Mcdonald, male    DOB: 1965/01/23, 50 y.o.   MRN: [id number]  HPI   This is the case of Kurt Mcdonald, 50 y.o. Male, who was referred by Dr. Marcelline Mates in consultation regarding OSA.   Patient has had 2 sleep studies, last one was done 7 years ago over at [facility]. 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 Allegedly, he needed a BiPAP but insurance would not cover.   As you very well know, patient is a non smoker.  Not been dxed with asthma or copd. Usually with the weather change, he gets SOB and has a raspy voice. Episodic. Gets better in time with OTC meds. Started with a PNA 6 yrs ago. Uses alb prn.   Pt is exhausted all the time, fatigued.  Has snoring, witnessed apneas, hypersomnia. Hypersomnia affects fxnality. Wife has CSA >> uses Bipap.  Pt has chronic pain issues. Takes morphine x 4 mos. Was on hydrocodone so was switched recently. Goes to pain clinic. Meds are being titrated up.   (+) sleep talking. (-) other abn behavior.   ESS 17.    Review of Systems  Constitutional: Negative.  Negative for fever and unexpected weight change.  HENT: Negative for congestion, dental problem, ear pain, nosebleeds, postnasal drip, rhinorrhea, sinus pressure, sneezing, sore throat and trouble swallowing.   Eyes: Negative.  Negative for redness and itching.  Respiratory: Positive for shortness of breath. Negative for cough, chest tightness and wheezing.   Cardiovascular: Negative.  Negative for palpitations and leg swelling.  Gastrointestinal: Negative.  Negative for nausea and vomiting.  Endocrine: Negative.   Genitourinary: Negative.  Negative for dysuria.  Musculoskeletal: Positive for back pain. Negative for joint swelling.  Skin: Negative.  Negative for rash.  Allergic/Immunologic: Negative.   Neurological: Positive for headaches.  Hematological: Negative.  Does not bruise/bleed easily.  Psychiatric/Behavioral:  Negative.  Negative for dysphoric mood. The patient is not nervous/anxious.     Past Medical History  Diagnosis Date  . Dyspnea   . History of pneumonia   . Hypercholesteremia   . Overweight(278.02)   . Diverticulosis of colon   . Irritable bowel syndrome (IBS)   . NAFLD (nonalcoholic fatty liver disease)   . Male hypogonadism   . LBP (low back pain)   . Chronic pain syndrome   . Anxiety   . Depression   . Chicken pox   . Diverticulitis of colon without hemorrhage 06/04/2013  . Headache   . Vision abnormalities    (-) DVT, CA  Family History  Problem Relation Age of Onset  . Diabetes Maternal Grandfather   . Healthy Mother     Living  . Pneumonia Father 28    Deceased  . Bone cancer Paternal Grandfather   . Healthy Brother   . Healthy Son     x1  . Healthy Daughter     x2     Past Surgical History  Procedure Laterality Date  . Shoulder sugery  01/2010    right  . Tooth extraction  11/2011  . Wisdom tooth extraction      Social History   Social History  . Marital Status: Married    Spouse Name: Elnita Maxwell  . Number of Children: 3  . Years of Education: N/A   Occupational History  . purchasing    Social History Main Topics  . Smoking status: Never Smoker   . Smokeless tobacco: Never  Used  . Alcohol Use: 0.0 oz/week    0 Standard drinks or equivalent per week     Comment: Rare  . Drug Use: No  . Sexual Activity: Not on file   Other Topics Concern  . Not on file   Social History Narrative     Allergies  Allergen Reactions  . Butrans [Buprenorphine]     Felt funny  . Crestor [Rosuvastatin Calcium]     Severe leg cramping  . Sulfa Antibiotics     rash   Works as a Garment/textile technologist.   Outpatient Prescriptions Prior to Visit  Medication Sig Dispense Refill  . buPROPion (WELLBUTRIN SR) 100 MG 12 hr tablet TAKE 1 TABLET(100 MG) BY MOUTH TWICE DAILY 60 tablet 5  . clonazePAM (KLONOPIN) 1 MG tablet TAKE 1 TABLET BY MOUTH TWICE DAILY AS NEEDED 60  tablet 3  . DULoxetine (CYMBALTA) 60 MG capsule Take 1 capsule (60 mg total) by mouth daily. 60 capsule 3  . hyoscyamine (LEVSIN) 0.125 MG tablet Take 1 tablet (0.125 mg total) by mouth 4 (four) times daily as needed for cramping. 30 tablet 1  . morphine (KADIAN) 20 MG 24 hr capsule Take 20 mg by mouth 2 (two) times daily.    . Oxycodone HCl 10 MG TABS Take 1 tablet (10 mg total) by mouth 3 (three) times daily as needed. 90 tablet 0  . promethazine (PHENERGAN) 25 MG tablet Take 1 tablet (25 mg total) by mouth every 8 (eight) hours as needed for nausea or vomiting. 20 tablet 0  . zolpidem (AMBIEN) 10 MG tablet Take 1 tablet (10 mg total) by mouth at bedtime as needed for sleep. 15 tablet 1  . nortriptyline (PAMELOR) 50 MG capsule Take 1 capsule (50mg ) by mouth at bed time. SCHEDULE FOLLOW UP W/ DR. Everlena Cooper!!!! (Patient not taking: Reported on 05/02/2015) 30 capsule 0  . morphine (KADIAN) 20 MG 24 hr capsule Take 20 mg by mouth 2 (two) times daily.    Marland Kitchen nystatin (MYCOSTATIN) 100000 UNIT/ML suspension Use as directed 5 mLs (500,000 Units total) in the mouth or throat 4 (four) times daily. 60 mL 0   No facility-administered medications prior to visit.   Meds ordered this encounter  Medications  . topiramate (TOPAMAX) 50 MG tablet    Sig: Take 1 tablet by mouth 2 (two) times daily.    Refill:  2  . albuterol (PROVENTIL HFA;VENTOLIN HFA) 108 (90 Base) MCG/ACT inhaler    Sig: Inhale 2 puffs into the lungs every 4 (four) hours as needed for wheezing or shortness of breath.    Dispense:  3 Inhaler    Refill:  1           Objective:   Physical Exam  Vitals:  Filed Vitals:   05/02/15 1113  BP: 132/80  Pulse: 88  Height: 6\' 4"  (1.93 m)  Weight: 264 lb (119.75 kg)  SpO2: 98%    Constitutional/General:  Pleasant, well-nourished, well-developed, not in any distress,  Comfortably seating.  Well kempt  Body mass index is 32.15 kg/(m^2). Wt Readings from Last 3 Encounters:  05/02/15 264 lb  (119.75 kg)  03/02/15 275 lb 9.6 oz (125.011 kg)  01/14/15 270 lb 6.4 oz (122.653 kg)    Neck circumference: 17.5 inches  HEENT: Pupils equal and reactive to light and accommodation. Anicteric sclerae. Normal nasal mucosa.   No oral  lesions,  mouth clear,  oropharynx clear, no postnasal drip. (-) Oral thrush. No dental caries.  Airway -  Mallampati class III  Neck: No masses. Midline trachea. No JVD, (-) LAD. (-) bruits appreciated.  Respiratory/Chest: Grossly normal chest. (-) deformity. (-) Accessory muscle use.  Symmetric expansion. (-) Tenderness on palpation.  Resonant on percussion.  Diminished BS on both lower lung zones. (-) wheezing, crackles, rhonchi (-) egophony  Cardiovascular: Regular rate and  rhythm, heart sounds normal, no murmur or gallops, no peripheral edema  Gastrointestinal:  Normal bowel sounds. Soft, non-tender. No hepatosplenomegaly.  (-) masses.   Musculoskeletal:  Normal muscle tone. Normal gait.   Extremities: Grossly normal. (-) clubbing, cyanosis.  (-) edema  Skin: (-) rash,lesions seen.   Neurological/Psychiatric : alert, oriented to time, place, person. Normal mood and affect            Assessment & Plan:  OSA (obstructive sleep apnea) Patient has had 2 sleep studies, last one was done 7 years ago over at [facility]. 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 Allegedly, he needed a BiPAP but insurance would not cover.   As you very well know, patient is a non smoker.  Not been dxed with asthma or copd. Usually with the weather change, he gets SOB and has a raspy voice. Episodic. Gets better in time with OTC meds. Started with a PNA 6 yrs ago. Uses alb prn.   Pt is exhausted all the time, fatigued.  Has snoring, witnessed apneas, hypersomnia. Hypersomnia affects fxnality. Wife has CSA >> uses Bipap.  Pt has chronic pain issues. Takes morphine x 4 mos. Was on hydrocodone so was switched recently. Goes to pain  clinic. Meds are being titrated up.   (+) sleep talking. (-) other abn behavior.   ESS 17.   Plan : 1. Needs a split-night study. Likely with obstructive and central sleep apnea. On chronic pain meds. 2. May need CPAP or BiPAP. Wife uses BiPAP. Anticipate no issues with it. 3. If with central sleep apnea, may need to get ABG and 2-D echo. On chronic pain meds with morphine. 4. [place].  Asthma On and off dyspnea and hoarseness with change in the weather. He usually gets better with by mouth allergy meds. Also with opiate withdrawal when necessary. Every now and then, he needs prednisone. Recent flare. No wheezing. Alb prn.  Patient to call if with worsening symptoms.  Obesity Weight reduction  Exertional dyspnea Likely 2/2 obesity/asthma. Will observe. May need echo if with CSA.     Thank you very much for letting me participate in this patient's care. Please do not hesitate to give me a call if you have any questions or concerns regarding the treatment plan.   Patient will follow up with me in August 2017.     Pollie Meyer, MD 05/02/2015   11:58 AM Pulmonary and Critical Care Medicine [facility] Pager: [phone] Office: [phone], Fax: [phone]

## 2015-05-02 NOTE — Assessment & Plan Note (Addendum)
Patient has had 2 sleep studies, last one was done 7 years ago over at [facility]. 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 Allegedly, he needed a BiPAP but insurance would not cover.   As you very well know, patient is a non smoker.  Not been dxed with asthma or copd. Usually with the weather change, he gets SOB and has a raspy voice. Episodic. Gets better in time with OTC meds. Started with a PNA 6 yrs ago. Uses alb prn.   Pt is exhausted all the time, fatigued.  Has snoring, witnessed apneas, hypersomnia. Hypersomnia affects fxnality. Wife has CSA >> uses Bipap.  Pt has chronic pain issues. Takes morphine x 4 mos. Was on hydrocodone so was switched recently. Goes to pain clinic. Meds are being titrated up.   (+) sleep talking. (-) other abn behavior.   ESS 17.   Plan : 1. Needs a split-night study. Likely with obstructive and central sleep apnea. On chronic pain meds. 2. May need CPAP or BiPAP. Wife uses BiPAP. Anticipate no issues with it. 3. If with central sleep apnea, may need to get ABG and 2-D echo. On chronic pain meds with morphine. 4. [place] will be out of [place] country [place].

## 2015-05-02 NOTE — Assessment & Plan Note (Signed)
On and off dyspnea and hoarseness with change in the weather. He usually gets better with by mouth allergy meds. Also with opiate withdrawal when necessary. Every now and then, he needs prednisone. Recent flare. No wheezing. Alb prn.  Patient to call if with worsening symptoms.

## 2015-05-02 NOTE — Assessment & Plan Note (Signed)
Weight reduction 

## 2015-05-02 NOTE — Assessment & Plan Note (Signed)
Likely 2/2 obesity/asthma. Will observe. May need echo if with CSA.

## 2015-05-02 NOTE — Patient Instructions (Signed)
1. We will order you a split night study. 2. If that is positive, we will order you a cpap or bipap machine. 3. Give [place] a call if your having issues with your machine.  Return to clinic in end of August 2017.

## 2015-05-06 ENCOUNTER — Ambulatory Visit (HOSPITAL_BASED_OUTPATIENT_CLINIC_OR_DEPARTMENT_OTHER): Payer: BLUE CROSS/BLUE SHIELD | Attending: Pulmonary Disease | Admitting: Pulmonary Disease

## 2015-05-06 VITALS — Ht 76.0 in | Wt 262.0 lb

## 2015-05-06 DIAGNOSIS — E669 Obesity, unspecified: Secondary | ICD-10-CM | POA: Insufficient documentation

## 2015-05-06 DIAGNOSIS — R0683 Snoring: Secondary | ICD-10-CM | POA: Diagnosis not present

## 2015-05-06 DIAGNOSIS — Z79899 Other long term (current) drug therapy: Secondary | ICD-10-CM | POA: Insufficient documentation

## 2015-05-06 DIAGNOSIS — R51 Headache: Secondary | ICD-10-CM | POA: Insufficient documentation

## 2015-05-06 DIAGNOSIS — G4730 Sleep apnea, unspecified: Secondary | ICD-10-CM | POA: Diagnosis present

## 2015-05-06 DIAGNOSIS — R5383 Other fatigue: Secondary | ICD-10-CM | POA: Insufficient documentation

## 2015-05-06 DIAGNOSIS — G4733 Obstructive sleep apnea (adult) (pediatric): Secondary | ICD-10-CM | POA: Insufficient documentation

## 2015-05-06 DIAGNOSIS — Z6832 Body mass index (BMI) 32.0-32.9, adult: Secondary | ICD-10-CM | POA: Diagnosis not present

## 2015-05-13 ENCOUNTER — Telehealth: Payer: Self-pay | Admitting: Pulmonary Disease

## 2015-05-13 DIAGNOSIS — G4730 Sleep apnea, unspecified: Secondary | ICD-10-CM

## 2015-05-13 DIAGNOSIS — G4733 Obstructive sleep apnea (adult) (pediatric): Secondary | ICD-10-CM

## 2015-05-13 NOTE — Telephone Encounter (Signed)
  Please call the pt and tell the pt LAB  SLEEP STUDY  showed OSA   Pt stops breathing 6    times an hour.   Please order autoCPAP 5-15 cm H2O. Patient will need a mask fitting session. Patient will need a 1 month download.   Patient needs to be seen 4-6 weeks after obtaining the cpap machine. Let me know if you receive this.   Thanks!   J. Shirl Harris, MD 05/13/2015, 9:01 AM

## 2015-05-13 NOTE — Procedures (Signed)
Patient Name: Kurt Mcdonald, Kurt Mcdonald Date: 05/06/2015   Gender: Male  D.O.B: 1965/04/17  Age (years): 49  Referring Provider: Delphina Cahill De Dios   Height (inches): 76  Interpreting Physician: Delphina Cahill De Dios   Weight (lbs): 262  RPSGT: Shelah Lewandowsky   BMI: 32  MRN: [id number]  Neck Size: 16.50    CLINICAL INFORMATION  Sleep Study Type: NPSG  Indication for sleep study: Fatigue, Morning Headaches, Obesity, OSA, Snoring  Epworth Sleepiness Score: 19   SLEEP STUDY TECHNIQUE  As per the AASM Manual for the Scoring of Sleep and Associated Events v2.3 (April 2016) with a hypopnea requiring 4% desaturations.  The channels recorded and monitored were frontal, central and occipital EEG, electrooculogram (EOG), submentalis EMG (chin), nasal and oral airflow, thoracic and abdominal wall motion, anterior tibialis EMG, snore microphone, electrocardiogram, and pulse oximetry.   MEDICATIONS  Patient's medications include: CLONAZEPAM, DULOXETINE, MORPHINE, OXYCODONE HCL, TOPAMAX.  Medications self-administered by patient during sleep study : No sleep medicine administered.   SLEEP ARCHITECTURE  The study was initiated at 9:50:41 PM and ended at 4:24:50 AM.  Sleep onset time was 42.7 minutes and the sleep efficiency was 63.3%. The total sleep time was 249.5 minutes.  Stage REM latency was 224.5 minutes.  The patient spent 16.03% of the night in stage N1 sleep, 71.94% in stage N2 sleep, 0.00% in stage N3 and 12.02% in REM.  Alpha intrusion was absent.  Supine sleep was 12.22%.   RESPIRATORY PARAMETERS  The overall apnea/hypopnea index (AHI) was 6.0 per hour. There were 2 total apneas, including 0 obstructive, 2 central and 0 mixed apneas. There were 23 hypopneas and 11 RERAs.  The AHI during Stage REM sleep was 32.0 per hour. AHI while supine was 3.9 per hour.  The mean oxygen saturation was 90.53%. The minimum SpO2 during sleep was 78.00%.  Moderate snoring was noted during  this study.  CARDIAC DATA  The 2 lead EKG demonstrated sinus rhythm. The mean heart rate was 81.27 beats per minute. Other EKG findings include: None.   LEG MOVEMENT DATA  The total PLMS were 2 with a resulting PLMS index of 0.48. Associated arousal with leg movement index was 0.0 .  IMPRESSIONS  Mild obstructive sleep apnea occurred during this study (AHI = 6.0/h). He had a combination of mostly hypopneas and central apneas. No significant central sleep apnea occurred during this study (CAI = 0.5/h). Moderate oxygen desaturation was noted during this study (Min O2 = 78.00%). The patient snored with Moderate snoring volume. No cardiac abnormalities were noted during this study. Clinically significant periodic limb movements did not occur during sleep. No significant associated arousals.   DIAGNOSIS  Obstructive Sleep Apnea (327.23 [G47.33 ICD-10])   RECOMMENDATIONS  Suggest starting patient on autoCPAP 5-15 cm H2O if with significant symptoms such as hypersomnia and fatigue. Patient will need a mask fitting session as well as a 1 month download to determine CPAP efficacy. Avoid alcohol, sedatives and other CNS depressants that may worsen sleep apnea and disrupt normal sleep architecture. Sleep hygiene should be reviewed to assess factors that may improve sleep quality. Weight management and regular exercise should be initiated or continued if appropriate. Follow up in the office 4-6 weeks after obtaining CPAP machine.  Pollie Meyer, MD 05/13/2015, 8:58 AM  Pulmonary and Critical Care Pager [phone] After 3 pm or if no answer, call [phone]

## 2015-05-13 NOTE — Telephone Encounter (Signed)
Spoke with pt and gave results and recommendations. Pt agrees to start CPAP therapy. Order placed. Pt aware to call office and schedule f/u appt once starts CPAP. Nothing further needed.  

## 2015-06-20 ENCOUNTER — Ambulatory Visit: Payer: BLUE CROSS/BLUE SHIELD | Admitting: Gastroenterology

## 2015-06-22 ENCOUNTER — Other Ambulatory Visit: Payer: Self-pay | Admitting: Physician Assistant

## 2015-06-22 NOTE — Telephone Encounter (Signed)
Rx refills to pharmacy/sls 06/21

## 2015-06-30 ENCOUNTER — Ambulatory Visit (HOSPITAL_BASED_OUTPATIENT_CLINIC_OR_DEPARTMENT_OTHER): Payer: BLUE CROSS/BLUE SHIELD

## 2015-08-04 ENCOUNTER — Ambulatory Visit: Payer: BLUE CROSS/BLUE SHIELD | Admitting: Pulmonary Disease

## 2015-08-17 ENCOUNTER — Ambulatory Visit (INDEPENDENT_AMBULATORY_CARE_PROVIDER_SITE_OTHER): Payer: BLUE CROSS/BLUE SHIELD | Admitting: Gastroenterology

## 2015-08-17 ENCOUNTER — Encounter: Payer: Self-pay | Admitting: Gastroenterology

## 2015-08-17 VITALS — BP 138/88 | HR 112 | Ht 74.0 in | Wt 264.2 lb

## 2015-08-17 DIAGNOSIS — R109 Unspecified abdominal pain: Secondary | ICD-10-CM | POA: Diagnosis not present

## 2015-08-17 DIAGNOSIS — R1310 Dysphagia, unspecified: Secondary | ICD-10-CM

## 2015-08-17 DIAGNOSIS — K5900 Constipation, unspecified: Secondary | ICD-10-CM

## 2015-08-17 MED ORDER — PLECANATIDE 3 MG PO TABS: 1.0000 | ORAL_TABLET | Freq: Every day | ORAL | 5 refills | Status: DC

## 2015-08-17 MED ORDER — HYOSCYAMINE SULFATE 0.125 MG PO TABS: ORAL_TABLET | ORAL | 11 refills | Status: DC

## 2015-08-17 NOTE — Patient Instructions (Addendum)
We have sent the following medications to your pharmacy for you to pick up at your convenience:Levsin and Trulance.   You can take over the counter Pepcid AC daily and Tums as needed.  You have been scheduled for a Barium Esophogram at [facility] (1st floor of the hospital) on 08/25/15 at 10:30am. Please arrive 15 minutes prior to your appointment for registration. Make certain not to have anything to eat or drink after midnight. If you need to reschedule for any reason, please contact radiology at [phone] to do so. __________________________________________________________________ A barium swallow is an examination that concentrates on views of the esophagus. This tends to be a double contrast exam (barium and two liquids which, when combined, create a gas to distend the wall of the oesophagus) or single contrast (non-ionic iodine based). The study is usually tailored to your symptoms so a good history is essential. Attention is paid during the study to the form, structure and configuration of the esophagus, looking for functional disorders (such as aspiration, dysphagia, achalasia, motility and reflux) EXAMINATION You may be asked to change into a gown, depending on the type of swallow being performed. A radiologist and radiographer will perform the procedure. The radiologist will advise you of the type of contrast selected for your procedure and direct you during the exam. You will be asked to stand, sit or lie in several different positions and to hold a small amount of fluid in your mouth before being asked to swallow while the imaging is performed .In some instances you may be asked to swallow barium coated marshmallows to assess the motility of a solid food bolus. The exam can be recorded as a digital or video fluoroscopy procedure. POST PROCEDURE It will take 1-2 days for the barium to pass through your system. To facilitate this, it is important, unless otherwise directed, to  increase your fluids for the next 24-48hrs and to resume your normal diet.  This test typically takes about 30 minutes to perform. __________________________________________________________________________________   If Trulance is too expensive, please contact our office.   Thank you for choosing me and [facility].  Pricilla Riffle. Dagoberto Ligas., MD., Marval Regal

## 2015-08-17 NOTE — Progress Notes (Addendum)
    History of Present Illness: This is a 50 year old male referred by Brunetta Jeans, PA-C for the evaluation of left sided abdominal pain and constipation. He has a 20 year history of recurrent abdominal pain with alternating bowel habits. Since beginning opioid pain medications in 01/2015 for back pain he has had ongoing constipation. He has tried fiber supplements and MiraLAX without adequate symptom control. Relates prior colonoscopy by [facility] about 5 years ago. He has noted the onset of heartburn in the evenings for the past 2 months. He has also noted difficulty swallowing rice for 2 months. No other foods cause difficulty swallowing. He has taken Tums as needed. He notes a bulge in his epigastric area. Denies weight loss, diarrhea, change in stool caliber, melena, hematochezia, nausea, vomiting, chest pain.   04/01/2013 abd/pelvic CT IMPRESSION: No acute findings in the abdomen or pelvis. Left colonic diverticulosis without diverticulitis.  Review of Systems: Pertinent positive and negative review of systems were noted in the above HPI section. All other review of systems were otherwise negative.  Current Medications, Allergies, Past Medical History, Past Surgical History, Family History and Social History were reviewed in [employer] record.  Physical Exam: General: Well developed, well nourished, no acute distress Head: Normocephalic and atraumatic Eyes:  sclerae anicteric, EOMI Ears: Normal auditory acuity Mouth: No deformity or lesions Neck: Supple, no masses or thyromegaly Lungs: Clear throughout to auscultation Heart: Regular rate and rhythm; no murmurs, rubs or bruits Abdomen: Soft, non tender and non distended. No masses, hepatosplenomegaly or hernias noted. Normal Bowel sounds Musculoskeletal: Symmetrical with no gross deformities  Skin: No lesions on visible extremities Pulses:  Normal pulses noted Extremities: No clubbing, cyanosis, edema or  deformities noted Neurological: Alert oriented x 4, grossly nonfocal Cervical Nodes:  No significant cervical adenopathy Inguinal Nodes: No significant inguinal adenopathy Psychological:  Alert and cooperative. Normal mood and affect  Assessment and Recommendations:  1. Left sided abdominal pain, constipation. 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 Continue Levsin 1-2 every 4 hours as needed. Begin Trulance 3 mg daily for constipation. Request office and colonoscopy records from [place].  2. GERD and dysphagia. Standard antireflux measures. Pepcid AC every evening for 2 weeks and then qpm prn. TUMS prn. Schedule barium esophagram. Consider scheduling EGD at return office visit in 6 weeks based on barium esophagram findings and symptom response.  3. Prominent xiphoid process. Reassured.  4. Diverticulosis. Long-term high fiber diet with adequate daily water intake.   cc: Brunetta Jeans, PA-C [address] [place], [place] [address]   08/19/2015 records received and reviewed from 2007 from [place]. He was cared for by Drs. Walker and Hurrelbrink.  Colonoscopy to [facility] on 05/01/2005 revealed mild sigmoid colon diverticulosis and 2 small nonspecific ulcers in the sigmoid colon measuring about 5 mm each. Biopsies showed no significant histopathology.  Colonoscopy to the [facility] in February 1993 was unremarkable.  He carries a diagnosis of irritable bowel syndrome which was treated with dicyclomine 20 mg 4 times a day in 2007.

## 2015-08-19 ENCOUNTER — Other Ambulatory Visit: Payer: Self-pay | Admitting: Physician Assistant

## 2015-08-22 ENCOUNTER — Telehealth: Payer: Self-pay

## 2015-08-22 NOTE — Telephone Encounter (Signed)
Left a message for patient to return my call. 

## 2015-08-22 NOTE — Telephone Encounter (Signed)
Refill sent per [facility] refill protocol, provider reviewed/SLS

## 2015-08-22 NOTE — Telephone Encounter (Signed)
-----   Message from Ladene Artist, MD sent at 08/19/2015  5:52 PM EDT ----- Please see addendum from recent office note.  Please contact patient and inform him that we received records from [place] from 2007. From records we received it appears his last colonoscopy was performed in May 2007. He is due for a screening colonoscopy. Please check on how his constipation is doing on Trulance.

## 2015-08-24 NOTE — Telephone Encounter (Signed)
Left a message for patient to return my call. 

## 2015-08-25 ENCOUNTER — Ambulatory Visit (HOSPITAL_COMMUNITY): Admission: RE | Admit: 2015-08-25 | Payer: BLUE CROSS/BLUE SHIELD | Source: Ambulatory Visit

## 2015-08-25 NOTE — Telephone Encounter (Signed)
Sent letter to patient's home.

## 2015-08-29 ENCOUNTER — Telehealth: Payer: Self-pay | Admitting: Gastroenterology

## 2015-08-29 ENCOUNTER — Ambulatory Visit (HOSPITAL_COMMUNITY)
Admission: RE | Admit: 2015-08-29 | Discharge: 2015-08-29 | Disposition: A | Discharge status: Home / Self Care | Payer: BLUE CROSS/BLUE SHIELD | Source: Ambulatory Visit | Attending: Gastroenterology | Admitting: Gastroenterology

## 2015-08-29 DIAGNOSIS — R1310 Dysphagia, unspecified: Secondary | ICD-10-CM | POA: Diagnosis present

## 2015-08-29 DIAGNOSIS — K449 Diaphragmatic hernia without obstruction or gangrene: Secondary | ICD-10-CM | POA: Insufficient documentation

## 2015-08-29 NOTE — Telephone Encounter (Signed)
Patient returned my call and I informed him that Dr. Fuller Plan has reviewed his records from [place] and that he is due for a Colonoscopy. Patient states he will discuss dates with his wife and call back next week. I also asked patient on an update of his constipation since starting Trulance. Patient states he doing much better and he is having a bowel movement every day. He reports no abdominal cramping like before and just doing extremely better overall. I informed patient that I will send this note to Dr. Fuller Plan to make him aware. Patient verbalized understanding.

## 2015-08-31 ENCOUNTER — Encounter: Payer: Self-pay | Admitting: Pulmonary Disease

## 2015-08-31 ENCOUNTER — Ambulatory Visit (INDEPENDENT_AMBULATORY_CARE_PROVIDER_SITE_OTHER)
Admission: RE | Admit: 2015-08-31 | Discharge: 2015-08-31 | Disposition: A | Discharge status: Home / Self Care | Payer: BLUE CROSS/BLUE SHIELD | Source: Ambulatory Visit | Attending: Pulmonary Disease | Admitting: Pulmonary Disease

## 2015-08-31 ENCOUNTER — Ambulatory Visit (INDEPENDENT_AMBULATORY_CARE_PROVIDER_SITE_OTHER): Payer: BLUE CROSS/BLUE SHIELD | Admitting: Pulmonary Disease

## 2015-08-31 ENCOUNTER — Telehealth: Payer: Self-pay | Admitting: Gastroenterology

## 2015-08-31 VITALS — BP 148/88 | HR 56 | Ht 74.0 in | Wt 262.0 lb

## 2015-08-31 DIAGNOSIS — R0600 Dyspnea, unspecified: Secondary | ICD-10-CM | POA: Diagnosis not present

## 2015-08-31 DIAGNOSIS — E669 Obesity, unspecified: Secondary | ICD-10-CM

## 2015-08-31 DIAGNOSIS — G4733 Obstructive sleep apnea (adult) (pediatric): Secondary | ICD-10-CM

## 2015-08-31 DIAGNOSIS — R0609 Other forms of dyspnea: Secondary | ICD-10-CM

## 2015-08-31 MED ORDER — FLUTICASONE FUROATE-VILANTEROL 100-25 MCG/INH IN AEPB: 1.0000 | INHALATION_SPRAY | Freq: Every day | RESPIRATORY_TRACT | 0 refills | Status: AC

## 2015-08-31 NOTE — Assessment & Plan Note (Signed)
Patient has had 2 sleep studies, last one was done 7 years ago over at [facility]. 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 Allegedly, he needed a BiPAP but insurance would not cover.   Patient with recent lab sleep test which showed AHI of 6. This was done in may, 2017. Auto CPAP was ordered but never received it. We sent it to a DME [place] and they told him to come over at [facility] but patient did not have the time.  ESS 17.   Plan : We extensively discussed the diagnosis, pathophysiology, and treatment options for Obstructive Sleep Apnea (OSA).  We discussed treatment options for OSA including CPAP, BiPaP, as well as surgical options and oral devices.   We will start patient on autocpap 5-15 cm water. [facility] is [facility] his home. He lost his job 2 weeks ago and he has COBRA. Hopefully, COBRA will cover his CPAP.  Patient was instructed to call the office if he/she has not received the PAP device in 1-2 weeks.  Patient was instructed to have mask, tubings, filter, reservoir cleaned at least once a week with soapy water.  Patient was instructed to call the office if he/she is having issues with the PAP device.    I advised patient to obtain sufficient amount of sleep --  7 to 8 hours at least in a 24 hr period.  Patient was advised to follow good sleep hygiene.  Patient was advised NOT to engage in activities requiring concentration and/or vigilance if he/she is and  sleepy.  Patient is NOT to drive if he/she is sleepy.

## 2015-08-31 NOTE — Addendum Note (Signed)
Addended by: Beckie Busing on: 08/31/2015 04:35 PM   Modules accepted: Orders

## 2015-08-31 NOTE — Addendum Note (Signed)
Addended by: Wynn Banker H on: 08/31/2015 04:05 PM   Modules accepted: Orders

## 2015-08-31 NOTE — Progress Notes (Signed)
Subjective:    Patient ID: Kurt Mcdonald, male    DOB: [birth year], 50 y.o.   MRN: [id number]  HPI   This is the case of Kurt Mcdonald, 50 y.o. Male, who was referred by Dr. Marcelline Mcdonald in consultation regarding OSA.   Patient has had 2 sleep studies, last one was done 7 years ago over at [facility]. 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 Allegedly, he needed a BiPAP but insurance would not cover.   As you very well know, patient is a non smoker.  Not been dxed with asthma or copd. Usually with the weather change, he gets SOB and has a raspy voice. Episodic. Gets better in time with OTC meds. Started with a PNA 6 yrs ago. Uses alb prn.   Pt is exhausted all the time, fatigued.  Has snoring, witnessed apneas, hypersomnia. Hypersomnia affects fxnality. Wife has CSA >> uses Bipap.  Pt has chronic pain issues. Takes morphine x 4 mos. Was on hydrocodone so was switched recently. Goes to pain clinic. Meds are being titrated up.   (+) sleep talking. (-) other abn behavior.   ESS 17.   ROV 08/31/15 Patient returns to the office as follow-up on his sleep apnea. Patient had a lab study in may, 2017 which showed AHI of 6. He has not received a CPAP machine because the DME company in [place] wanted him to drive to [place] and he has been busy with work. Pt was started on Trulance 2 weeks ago for constipation.  Stared with diarrhea, flushed, bloated,jittery, SOB x 1 week. Occasional chest tightness with stressed. (-) fevers, chills.  He also lost his job 2 weeks ago. Has not been admitted nor has been on abx since last seen. No recent travel. Denies h/o DVT/PE.   Review of Systems  Constitutional: Negative.  Negative for fever and unexpected weight change.  HENT: Negative for congestion, dental problem, ear pain, nosebleeds, postnasal drip, rhinorrhea, sinus pressure, sneezing, sore throat and trouble swallowing.   Eyes: Negative.  Negative for redness and itching.    Respiratory: Positive for shortness of breath. Negative for cough, chest tightness and wheezing.   Cardiovascular: Negative.  Negative for palpitations and leg swelling.  Gastrointestinal: Positive for diarrhea. Negative for nausea and vomiting.  Endocrine: Negative.   Genitourinary: Negative.  Negative for dysuria.  Musculoskeletal: Positive for back pain. Negative for joint swelling.  Skin: Negative.  Negative for rash.  Allergic/Immunologic: Negative.   Neurological: Positive for headaches.  Hematological: Negative.  Does not bruise/bleed easily.  Psychiatric/Behavioral: Negative.  Negative for dysphoric mood. The patient is not nervous/anxious.         Objective:   Physical Exam  Vitals:  Vitals:   08/31/15 1513  BP: (!) 148/88  Pulse: (!) 56  SpO2: 97%  Weight: 262 lb (118.8 kg)  Height: 6\' 2"  (1.88 m)    Constitutional/General:  Pleasant, well-nourished, well-developed, not in any distress,  Comfortably seating.  Well kempt  Body mass index is 33.64 kg/m. Wt Readings from Last 3 Encounters:  08/31/15 262 lb (118.8 kg)  08/17/15 264 lb 4 oz (119.9 kg)  05/06/15 262 lb (118.8 kg)    Neck circumference: 17.5 inches  HEENT: Pupils equal and reactive to light and accommodation. Anicteric sclerae. Normal nasal mucosa.   No oral  lesions,  mouth clear,  oropharynx clear, no postnasal drip. (-) Oral thrush. No dental caries.  Airway - Mallampati class III  Neck: No  masses. Midline trachea. No JVD, (-) LAD. (-) bruits appreciated.  Respiratory/Chest: Grossly normal chest. (-) deformity. (-) Accessory muscle use.  Symmetric expansion. (-) Tenderness on palpation.  Resonant on percussion.  Diminished BS on both lower lung zones. (-) wheezing, crackles, rhonchi (-) egophony  Cardiovascular: Regular rate and  rhythm, heart sounds normal, no murmur or gallops, no peripheral edema  Gastrointestinal:  Normal bowel sounds. Soft, non-tender. No hepatosplenomegaly.   (-) masses.   Musculoskeletal:  Normal muscle tone. Normal gait.   Extremities: Grossly normal. (-) clubbing, cyanosis.  (-) edema  Skin: (-) rash,lesions seen.   Neurological/Psychiatric : alert, oriented to time, place, person. Normal mood and affect            Assessment & Plan:  OSA (obstructive sleep apnea) Patient has had 2 sleep studies, last one was done 7 years ago over at [facility]. 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 Allegedly, he needed a BiPAP but insurance would not cover.   Patient with recent lab sleep test which showed AHI of 6. This was done in may, 2017. Auto CPAP was ordered but never received it. We sent it to a DME [place] and they told him to come over at [facility] but patient did not have the time.  ESS 17.   Plan : We extensively discussed the diagnosis, pathophysiology, and treatment options for Obstructive Sleep Apnea (OSA).  We discussed treatment options for OSA including CPAP, BiPaP, as well as surgical options and oral devices.   We will start patient on autocpap 5-15 cm water. [facility] his home. He lost his job 2 weeks ago and he has COBRA. Hopefully, COBRA will cover his CPAP.  Patient was instructed to call the office if he/she has not received the PAP device in 1-2 weeks.  Patient was instructed to have mask, tubings, filter, reservoir cleaned at least once a week with soapy water.  Patient was instructed to call the office if he/she is having issues with the PAP device.    I advised patient to obtain sufficient amount of sleep --  7 to 8 hours at least in a 24 hr period.  Patient was advised to follow good sleep hygiene.  Patient was advised NOT to engage in activities requiring concentration and/or vigilance if he/she is and  sleepy.  Patient is NOT to drive if he/she is sleepy.      Exertional dyspnea Nonsmoker. No history of asthma or COPD. He had  pneumonia 6 years ago and since that time, he would have episodic dyspnea. Recent dyspnea, shortness of breath, chest tightness, associated with diarrhea, flushing. This happened a week ago. He was started on Trulance 2 weeks ago for constipation. Patient also lost his job 2 weeks ago. He has a lot of stress now.  Need to R/O asthma or hyperreactive airway dse.  I doubt.  I think this SOB is related to stress with losing job 2 weeks ago. To be thorough, will get CXR and PFTs.   Patient was advised to call if he's not better in a couple weeks.  Less likely PE. No recent travel. No history of PE.   Obesity Weight reduction.    Patient will follow up with me in 6 weeks.     Pollie Meyer, MD 08/31/2015   4:01 PM Pulmonary and Critical Care Medicine [facility] Pager: [phone] Office: [phone], Fax: [phone]  [phone]

## 2015-08-31 NOTE — Telephone Encounter (Signed)
I spoke with the patient and he reports terrible diarrhea.  He stopped Trulance 2 days ago.  Initally he was having diarrhea every hour, but today it has slowed to about 5 episodes.  He also reports SOB.  He has seen his pulmonologist today and they are checking an CXR.  He is advised to stop Trulance and push fluids.  PDR lists possible severe diarrhea and URI as possible side effects.  He will push fluids for the next several days.  He will call back for worsening symptoms or go to ED.  He is asking for an alternative med.  Please advise

## 2015-08-31 NOTE — Assessment & Plan Note (Signed)
Nonsmoker. No history of asthma or COPD. He had pneumonia 6 years ago and since that time, he would have episodic dyspnea. Recent dyspnea, shortness of breath, chest tightness, associated with diarrhea, flushing. This happened a week ago. He was started on Trulance 2 weeks ago for constipation. Patient also lost his job 2 weeks ago. He has a lot of stress now.  Need to R/O asthma or hyperreactive airway dse.  I doubt.  I think this SOB is related to stress with losing job 2 weeks ago. To be thorough, will get CXR and PFTs.   Patient was advised to call if he's not better in a couple weeks.  Less likely PE. No recent travel. No history of PE.

## 2015-08-31 NOTE — Assessment & Plan Note (Signed)
Weight reduction 

## 2015-08-31 NOTE — Patient Instructions (Signed)
  It was a pleasure taking care of you today!  You are diagnosed with Obstructive Sleep Apnea or OSA.  You stop breathing  6 times/hr.   We will order you an autocpap  machine.  Please call the office if you do NOT receive your machine in the next 1-2 weeks.   Please make sure you use your CPAP device everytime you sleep.  We will monitor the usage of your machine per your insurance requirement.  Your insurance company may take the machine from you if you are not using it regularly.   Please clean the mask, tubings, filter, water reservoir with soapy water every week.  Please use distilled water for the water reservoir.   Please call the office or your machine provider (DME company) if you are having issues with the device.    We will get a chest x-ray and a breathing test. Call us if your breathing is not better in 1-2 weeks.   Return to clinic in 6 weeks.

## 2015-09-01 NOTE — Telephone Encounter (Signed)
Miralax 1-2 times daily

## 2015-09-01 NOTE — Telephone Encounter (Signed)
Left message for patient to call back  

## 2015-09-01 NOTE — Telephone Encounter (Signed)
Patient advised He will call back for any additional questions or concerns 

## 2015-09-14 ENCOUNTER — Ambulatory Visit (HOSPITAL_COMMUNITY)
Admission: RE | Admit: 2015-09-14 | Discharge: 2015-09-14 | Disposition: A | Discharge status: Home / Self Care | Payer: BLUE CROSS/BLUE SHIELD | Source: Ambulatory Visit | Attending: Pulmonary Disease | Admitting: Pulmonary Disease

## 2015-09-14 DIAGNOSIS — R0600 Dyspnea, unspecified: Secondary | ICD-10-CM | POA: Insufficient documentation

## 2015-09-14 LAB — PULMONARY FUNCTION TEST
DL/VA % pred: 72 %
DL/VA: 3.57 ml/min/mmHg/L
DLCO UNC % PRED: 78 %
DLCO UNC: 31.34 ml/min/mmHg
FEF 25-75 PRE: 6.08 L/s
FEF 25-75 Post: 7.67 L/sec
FEF2575-%CHANGE-POST: 26 %
FEF2575-%Pred-Post: 191 %
FEF2575-%Pred-Pre: 151 %
FEV1-%Change-Post: 8 %
FEV1-%PRED-POST: 123 %
FEV1-%Pred-Pre: 113 %
FEV1-POST: 5.73 L
FEV1-Pre: 5.27 L
FEV1FVC-%Change-Post: 2 %
FEV1FVC-%Pred-Pre: 105 %
FEV6-%CHANGE-POST: 5 %
FEV6-%PRED-POST: 114 %
FEV6-%PRED-PRE: 108 %
FEV6-POST: 6.67 L
FEV6-PRE: 6.3 L
FEV6FVC-%CHANGE-POST: 0 %
FEV6FVC-%PRED-POST: 102 %
FEV6FVC-%PRED-PRE: 101 %
FVC-%CHANGE-POST: 5 %
FVC-%Pred-Post: 112 %
FVC-%Pred-Pre: 106 %
FVC-Post: 6.77 L
FVC-Pre: 6.4 L
POST FEV6/FVC RATIO: 99 %
Post FEV1/FVC ratio: 85 %
Pre FEV1/FVC ratio: 82 %
Pre FEV6/FVC Ratio: 99 %
RV % PRED: 121 %
RV: 2.83 L
TLC % pred: 117 %
TLC: 9.45 L

## 2015-09-14 MED ORDER — ALBUTEROL SULFATE (2.5 MG/3ML) 0.083% IN NEBU
2.5000 mg | INHALATION_SOLUTION | Freq: Once | RESPIRATORY_TRACT | Status: AC
  Administered 2015-09-14: 2.5 mg via RESPIRATORY_TRACT

## 2015-10-10 ENCOUNTER — Telehealth: Payer: Self-pay | Admitting: Gastroenterology

## 2015-10-10 ENCOUNTER — Ambulatory Visit: Payer: BLUE CROSS/BLUE SHIELD | Admitting: Gastroenterology

## 2015-10-10 NOTE — Telephone Encounter (Signed)
Per Dr. Fuller Plan bill patient for late cancellation.

## 2015-10-26 ENCOUNTER — Ambulatory Visit: Payer: BLUE CROSS/BLUE SHIELD | Admitting: Pulmonary Disease

## 2015-10-27 ENCOUNTER — Other Ambulatory Visit: Payer: Self-pay | Admitting: Physician Assistant

## 2015-10-27 NOTE — Telephone Encounter (Signed)
Please advise on refill. Last Rx was sent in on 06/22/15 with #60 and 2 refills.

## 2015-10-28 ENCOUNTER — Encounter: Payer: Self-pay | Admitting: Physician Assistant

## 2015-10-28 MED ORDER — CLONAZEPAM 1 MG PO TABS: 1.0000 mg | ORAL_TABLET | Freq: Two times a day (BID) | ORAL | 0 refills | Status: DC | PRN

## 2015-10-28 NOTE — Telephone Encounter (Signed)
Please call patient to pick up script -- must be picked up today to update his contract He will have to give random UDS today before pickup.

## 2015-10-28 NOTE — Telephone Encounter (Signed)
I spoke with the patient and advised him that we would need an updated UDS contract and he voices understanding. Pt did not have any questions. Pt states that he can come in to sign the updated contract. Rx printed and forwarded to provider for signature.

## 2015-11-16 ENCOUNTER — Encounter: Payer: Self-pay | Admitting: Pulmonary Disease

## 2015-11-17 ENCOUNTER — Ambulatory Visit (INDEPENDENT_AMBULATORY_CARE_PROVIDER_SITE_OTHER): Payer: BLUE CROSS/BLUE SHIELD | Admitting: Pulmonary Disease

## 2015-11-17 ENCOUNTER — Encounter: Payer: Self-pay | Admitting: Pulmonary Disease

## 2015-11-17 DIAGNOSIS — G4733 Obstructive sleep apnea (adult) (pediatric): Secondary | ICD-10-CM | POA: Diagnosis not present

## 2015-11-17 DIAGNOSIS — E669 Obesity, unspecified: Secondary | ICD-10-CM | POA: Diagnosis not present

## 2015-11-17 NOTE — Assessment & Plan Note (Signed)
Weight reduction 

## 2015-11-17 NOTE — Assessment & Plan Note (Signed)
Patient has had 2 sleep studies, last one was done 7 years ago over at [facility]. 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 Allegedly, he needed a BiPAP but insurance would not cover.   Patient with recent lab sleep test which showed AHI of 6. This was done in may, 2017. He finally got his auto CPAP machine in October 2017.  He is better using it. More energy. Less sleepiness. Less hypersomnia. Had issues with nasal pillows. Was having a lot of sleep. Will switch to full face mask. He is also weaning his opiates. Currently has obamacare. Trying to get a job in [place] or [place].   Plan : We extensively discussed the importance of treating OSA and the need to use PAP therapy.   Continue with autocpap 5-15 cm water. Feels better using it. More energy. Less sleepiness. Feels benefit of CPAP. Continue with CPAP. Had issues with nasal pillows. Will switch to full face mask.   Patient was instructed to have mask, tubings, filter, reservoir cleaned at least once a week with soapy water.  Patient was instructed to call the office if he/she is having issues with the PAP device.    I advised patient to obtain sufficient amount of sleep --  7 to 8 hours at least in a 24 hr period.  Patient was advised to follow good sleep hygiene.  Patient was advised NOT to engage in activities requiring concentration and/or vigilance if he/she is and  sleepy.  Patient is NOT to drive if he/she is sleepy.

## 2015-11-17 NOTE — Progress Notes (Signed)
Subjective:    Patient ID: Kurt Mcdonald, male    DOB: May 12, 1965, 50 y.o.   MRN: [id number]  HPI   This is the case of Kurt Mcdonald, 50 y.o. Male, who was referred by Dr. Marcelline Mates in consultation regarding OSA.   Patient has had 2 sleep studies, last one was done 7 years ago over at [facility]. 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 Allegedly, he needed a BiPAP but insurance would not cover.   As you very well know, patient is a non smoker.  Not been dxed with asthma or copd. Usually with the weather change, he gets SOB and has a raspy voice. Episodic. Gets better in time with OTC meds. Started with a PNA 6 yrs ago. Uses alb prn.   Pt is exhausted all the time, fatigued.  Has snoring, witnessed apneas, hypersomnia. Hypersomnia affects fxnality. Wife has CSA >> uses Bipap.  Pt has chronic pain issues. Takes morphine x 4 mos. Was on hydrocodone so was switched recently. Goes to pain clinic. Meds are being titrated up.   (+) sleep talking. (-) other abn behavior.   ESS 17.   ROV 08/31/15 Patient returns to the office as follow-up on his sleep apnea. Patient had a lab study in may, 2017 which showed AHI of 6. He has not received a CPAP machine because the DME company in [place] wanted him to drive to [place] and he has been busy with work. Pt was started on Trulance 2 weeks ago for constipation.  Stared with diarrhea, flushed, bloated,jittery, SOB x 1 week. Occasional chest tightness with stressed. (-) fevers, chills.  He also lost his job 2 weeks ago. Has not been admitted nor has been on abx since last seen. No recent travel. Denies h/o DVT/PE.   ROV 11/17/15  Patient returns to the office as follow-up on his sleep apnea. Since last seen, we ordered an auto CPAP machine. Download the last month: 0.3%, AHI 2.3. He had issues with mask. He was using nasal pillows and was having a lot of issues with it. He wants full face mask which she tolerated during  the lab study. For his better using CPAP. More energy. Less sleepiness.   Review of Systems  Constitutional: Negative.  Negative for fever and unexpected weight change.  HENT: Negative for congestion, dental problem, ear pain, nosebleeds, postnasal drip, rhinorrhea, sinus pressure, sneezing, sore throat and trouble swallowing.   Eyes: Negative.  Negative for redness and itching.  Respiratory: Positive for shortness of breath. Negative for cough, chest tightness and wheezing.   Cardiovascular: Negative.  Negative for palpitations and leg swelling.  Gastrointestinal: Positive for diarrhea. Negative for nausea and vomiting.  Endocrine: Negative.   Genitourinary: Negative.  Negative for dysuria.  Musculoskeletal: Positive for back pain. Negative for joint swelling.  Skin: Negative.  Negative for rash.  Allergic/Immunologic: Negative.   Neurological: Positive for headaches.  Hematological: Negative.  Does not bruise/bleed easily.  Psychiatric/Behavioral: Negative.  Negative for dysphoric mood. The patient is not nervous/anxious.         Objective:   Physical Exam  Vitals:  Vitals:   11/17/15 1543  BP: 122/80  Pulse: (!) 105  SpO2: 100%  Weight: 274 lb (124.3 kg)  Height: 6\' 2"  (1.88 m)    Constitutional/General:  Pleasant, well-nourished, well-developed, not in any distress,  Comfortably seating.  Well kempt  Body mass index is 35.18 kg/m. Wt Readings from Last 3 Encounters:  11/17/15  274 lb (124.3 kg)  08/31/15 262 lb (118.8 kg)  08/17/15 264 lb 4 oz (119.9 kg)    Neck circumference: 17.5 inches  HEENT: Pupils equal and reactive to light and accommodation. Anicteric sclerae. Normal nasal mucosa.   No oral  lesions,  mouth clear,  oropharynx clear, no postnasal drip. (-) Oral thrush. No dental caries.  Airway - Mallampati class III  Neck: No masses. Midline trachea. No JVD, (-) LAD. (-) bruits appreciated.  Respiratory/Chest: Grossly normal chest. (-) deformity. (-)  Accessory muscle use.  Symmetric expansion. (-) Tenderness on palpation.  Resonant on percussion.  Diminished BS on both lower lung zones. (-) wheezing, crackles, rhonchi (-) egophony  Cardiovascular: Regular rate and  rhythm, heart sounds normal, no murmur or gallops, no peripheral edema  Gastrointestinal:  Normal bowel sounds. Soft, non-tender. No hepatosplenomegaly.  (-) masses.   Musculoskeletal:  Normal muscle tone. Normal gait.   Extremities: Grossly normal. (-) clubbing, cyanosis.  (-) edema  Skin: (-) rash,lesions seen.   Neurological/Psychiatric : alert, oriented to time, place, person. Normal mood and affect            Assessment & Plan:  OSA (obstructive sleep apnea) Patient has had 2 sleep studies, last one was done 7 years ago over at [facility]. 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 Allegedly, he needed a BiPAP but insurance would not cover.   Patient with recent lab sleep test which showed AHI of 6. This was done in may, 2017. He finally got his auto CPAP machine in October 2017.  He is better using it. More energy. Less sleepiness. Less hypersomnia. Had issues with nasal pillows. Was having a lot of sleep. Will switch to full face mask. He is also weaning his opiates. Currently has obamacare. Trying to get a job in [place] or [place].   Plan : We extensively discussed the importance of treating OSA and the need to use PAP therapy.   Continue with autocpap 5-15 cm water. Feels better using it. More energy. Less sleepiness. Feels benefit of CPAP. Continue with CPAP. Had issues with nasal pillows. Will switch to full face mask.   Patient was instructed to have mask, tubings, filter, reservoir cleaned at least once a week with soapy water.  Patient was instructed to call the office if he/she is having issues with the PAP device.    I advised patient to obtain sufficient amount of sleep --  7 to 8 hours at least in a 24 hr period.    Patient was advised to follow good sleep hygiene.  Patient was advised NOT to engage in activities requiring concentration and/or vigilance if he/she is and  sleepy.  Patient is NOT to drive if he/she is sleepy.     Obesity Weight reduction   Patient will follow up with me in 6 months.     Pollie Meyer, MD 11/17/2015   4:12 PM Pulmonary and Critical Care Medicine [facility] Pager: [phone] Office: [phone], Fax: [phone]

## 2015-11-17 NOTE — Patient Instructions (Signed)
  It was a pleasure taking care of you today!  Continue using your CPAP machine.   Please make sure you use your CPAP device everytime you sleep.  We will monitor the usage of your machine per your insurance requirement.  Your insurance company may take the machine from you if you are not using it regularly.   Please clean the mask, tubings, filter, water reservoir with soapy water every week.  Please use distilled water for the water reservoir.   Please call the office or your machine provider (DME company) if you are having issues with the device.   Return to clinic in 6 months  with Dr. De Dios    

## 2015-12-13 ENCOUNTER — Other Ambulatory Visit: Payer: Self-pay | Admitting: Physician Assistant

## 2016-02-10 ENCOUNTER — Other Ambulatory Visit: Payer: Self-pay | Admitting: Physician Assistant

## 2016-02-14 ENCOUNTER — Telehealth: Payer: Self-pay | Admitting: Physician Assistant

## 2016-02-14 NOTE — Telephone Encounter (Signed)
Patient requesting to transfer care from [facility] to Dr. Nani Ravens

## 2016-02-14 NOTE — Telephone Encounter (Signed)
Message left on patients voicemail @ [id number] for patient to call and schedule appointment to transfer care to Dr. Nani Ravens

## 2016-02-14 NOTE — Telephone Encounter (Signed)
OK 

## 2016-02-14 NOTE — Telephone Encounter (Signed)
Ok with me 

## 2016-05-03 ENCOUNTER — Telehealth: Payer: Self-pay | Admitting: Behavioral Health

## 2016-05-03 NOTE — Telephone Encounter (Signed)
Unable to reach patient at time of Pre-Visit Call.  Left message for patient to return call when available.    

## 2016-05-04 ENCOUNTER — Encounter: Payer: Self-pay | Admitting: Family Medicine

## 2016-05-04 ENCOUNTER — Ambulatory Visit (INDEPENDENT_AMBULATORY_CARE_PROVIDER_SITE_OTHER): Payer: BLUE CROSS/BLUE SHIELD | Admitting: Family Medicine

## 2016-05-04 VITALS — BP 128/84 | HR 92 | Temp 97.5°F | Ht 74.0 in | Wt 271.6 lb

## 2016-05-04 DIAGNOSIS — F329 Major depressive disorder, single episode, unspecified: Secondary | ICD-10-CM | POA: Diagnosis not present

## 2016-05-04 DIAGNOSIS — Z6834 Body mass index (BMI) 34.0-34.9, adult: Secondary | ICD-10-CM

## 2016-05-04 DIAGNOSIS — E669 Obesity, unspecified: Secondary | ICD-10-CM | POA: Diagnosis not present

## 2016-05-04 DIAGNOSIS — Z8639 Personal history of other endocrine, nutritional and metabolic disease: Secondary | ICD-10-CM

## 2016-05-04 DIAGNOSIS — R6889 Other general symptoms and signs: Secondary | ICD-10-CM | POA: Diagnosis not present

## 2016-05-04 DIAGNOSIS — R61 Generalized hyperhidrosis: Secondary | ICD-10-CM | POA: Diagnosis not present

## 2016-05-04 DIAGNOSIS — F419 Anxiety disorder, unspecified: Secondary | ICD-10-CM | POA: Diagnosis not present

## 2016-05-04 DIAGNOSIS — F32A Depression, unspecified: Secondary | ICD-10-CM

## 2016-05-04 LAB — LIPID PANEL
Cholesterol: 223 mg/dL — ABNORMAL HIGH (ref 0–200)
HDL: 40.1 mg/dL (ref >39.00)
NonHDL: 182.86
Total CHOL/HDL Ratio: 6
Triglycerides: 203 mg/dL — ABNORMAL HIGH (ref 0.0–149.0)
VLDL: 40.6 mg/dL — ABNORMAL HIGH (ref 0.0–40.0)

## 2016-05-04 LAB — COMPREHENSIVE METABOLIC PANEL
ALBUMIN: 4.5 g/dL (ref 3.5–5.2)
ALK PHOS: 57 U/L (ref 39–117)
ALT: 30 U/L (ref 0–53)
AST: 22 U/L (ref 0–37)
BILIRUBIN TOTAL: 0.7 mg/dL (ref 0.2–1.2)
BUN: 19 mg/dL (ref 6–23)
CO2: 27 mEq/L (ref 19–32)
CREATININE: 1.39 mg/dL (ref 0.40–1.50)
Calcium: 9.9 mg/dL (ref 8.4–10.5)
Chloride: 104 mEq/L (ref 96–112)
GFR: 57.29 mL/min — ABNORMAL LOW (ref >60.00)
GLUCOSE: 85 mg/dL (ref 70–99)
Potassium: 4.3 mEq/L (ref 3.5–5.1)
SODIUM: 140 meq/L (ref 135–145)
TOTAL PROTEIN: 7.3 g/dL (ref 6.0–8.3)

## 2016-05-04 LAB — CBC
HCT: 43.1 % (ref 39.0–52.0)
Hemoglobin: 14.7 g/dL (ref 13.0–17.0)
MCHC: 34.2 g/dL (ref 30.0–36.0)
MCV: 89.1 fl (ref 78.0–100.0)
Platelets: 449 10*3/uL — ABNORMAL HIGH (ref 150.0–400.0)
RBC: 4.84 Mil/uL (ref 4.22–5.81)
RDW: 13.9 % (ref 11.5–15.5)
WBC: 9.8 10*3/uL (ref 4.0–10.5)

## 2016-05-04 LAB — LDL CHOLESTEROL, DIRECT: LDL DIRECT: 155 mg/dL

## 2016-05-04 LAB — TSH: TSH: 1.98 u[IU]/mL (ref 0.35–4.50)

## 2016-05-04 MED ORDER — CLONAZEPAM 1 MG PO TABS: 1.0000 mg | ORAL_TABLET | Freq: Two times a day (BID) | ORAL | 2 refills | Status: DC | PRN

## 2016-05-04 MED ORDER — VENLAFAXINE HCL ER 75 MG PO CP24: 75.0000 mg | ORAL_CAPSULE | Freq: Every day | ORAL | 2 refills | Status: DC

## 2016-05-04 NOTE — Progress Notes (Signed)
Pre visit review using our clinic review tool, if applicable. No additional management support is needed unless otherwise documented below in the visit note. 

## 2016-05-04 NOTE — Progress Notes (Signed)
Chief Complaint  Patient presents with  . Transitions Of Care    pt want to discuss not sleeping,feeling alot of bloating,having hot flashes,elevated heart rate    Subjective: Patient is a 51 y.o. male here for transitioning of care.  Anxiety/depression Pt has longstanding hx of anxiety and depression. Over the past couple mo, pt has had worsening sleep, irritability, racing thoughts, sweating, abd bloating and overal feelings of anxiety. He was trialed on Wellbutrin in the past that did not help. He was on Cymbalta 60 mg daily for 10 years, but it does not help. He is interested in seeing psychiatry. He does not follow with a counselor or psychologist, but is open to seeing one. He denies SI or HI, no self medication. Social stressors with starting a new job and having 3 kids are affecting him.   He does have a famhx of thyroid disorder and would like to be checked today. He has not eaten.   ROS: Endo: +heat intolerance Psych: No SI or HI  Family History  Problem Relation Age of Onset  . Healthy Mother     Living  . Pneumonia Father 40    Deceased  . Bone cancer Paternal Grandfather   . Diabetes Maternal Grandfather   . Healthy Brother   . Healthy Son     x1  . Healthy Daughter     x2   Past Medical History:  Diagnosis Date  . Anxiety   . Chicken pox   . Chronic pain syndrome   . Depression   . Diverticulitis of colon without hemorrhage 06/04/2013  . Diverticulosis of colon   . Headache   . History of pneumonia   . Hypercholesteremia   . Irritable bowel syndrome (IBS)   . LBP (low back pain)   . Male hypogonadism   . NAFLD (nonalcoholic fatty liver disease)   . Overweight(278.02)   . Vision abnormalities    Allergies  Allergen Reactions  . Butrans [Buprenorphine]     Felt funny  . Crestor [Rosuvastatin Calcium]     Severe leg cramping  . Sulfa Antibiotics     rash    Current Outpatient Prescriptions:  .  albuterol (PROVENTIL HFA;VENTOLIN HFA) 108 (90 Base)  MCG/ACT inhaler, Inhale 2 puffs into the lungs every 4 (four) hours as needed for wheezing or shortness of breath., Disp: 3 Inhaler, Rfl: 1 .  clonazePAM (KLONOPIN) 1 MG tablet, Take 1 tablet (1 mg total) by mouth 2 (two) times daily as needed., Disp: 60 tablet, Rfl: 2 .  cyclobenzaprine (FLEXERIL) 10 MG tablet, Take 1 tablet by mouth as needed., Disp: , Rfl: 0 .  DULoxetine (CYMBALTA) 60 MG capsule, Take 1 capsule (60 mg total) by mouth daily., Disp: 60 capsule, Rfl: 3 .  HYDROcodone-acetaminophen (NORCO) 10-325 MG tablet, , Disp: , Rfl: 0 .  hyoscyamine (LEVSIN) 0.125 MG tablet, Take 1-2 capsules by mouth every 4 hours as needed, Disp: 100 tablet, Rfl: 11 .  ibuprofen (ADVIL,MOTRIN) 800 MG tablet, Take 1 tablet by mouth as needed., Disp: , Rfl: 0 .  Plecanatide (TRULANCE) 3 MG TABS, Take 1 capsule by mouth daily., Disp: 30 tablet, Rfl: 5 .  promethazine (PHENERGAN) 25 MG tablet, Take 1 tablet (25 mg total) by mouth every 8 (eight) hours as needed for nausea or vomiting., Disp: 20 tablet, Rfl: 0 .  Psyllium (METAMUCIL FIBER PO), Take 1 Dose by mouth 3 (three) times daily., Disp: , Rfl:  .  topiramate (TOPAMAX) 50 MG tablet, TAKE  1 TABLET(50 MG) BY MOUTH TWICE DAILY, Disp: 60 tablet, Rfl: 2 .  traZODone (DESYREL) 50 MG tablet, Take 1 tablet by mouth at bedtime., Disp: , Rfl: 1 .  venlafaxine XR (EFFEXOR-XR) 75 MG 24 hr capsule, Take 1 capsule (75 mg total) by mouth daily with breakfast., Disp: 30 capsule, Rfl: 2  Objective: BP 128/84 (BP Location: Left Arm, Patient Position: Sitting, Cuff Size: Large)   Pulse 92   Temp 97.5 F (36.4 C) (Oral)   Ht 6\' 2"  (1.88 m)   Wt 271 lb 9.6 oz (123.2 kg)   SpO2 97%   BMI 34.87 kg/m  General: Awake, appears stated age HEENT: MMM, EOMi Neck: No masses or asymmetry, no thyromegaly appreciated Heart: RRR, no murmurs, no bruits, no LE edema Lungs: CTAB, no rales, wheezes or rhonchi. No accessory muscle use Abd: BS+, soft, NT, ND, no masses or  organomegaly Skin: Warm, diaphoretic, no rashes or erythema Neuro: 2/4 patellar reflex, 1/4 biceps and calcaneal reflex b/l, no clonus MSK: 5/5 strength throughout, nml gait Psych: Age appropriate judgment and insight, normal affect and mood  Assessment and Plan: Anxiety and depression - Plan: clonazePAM (KLONOPIN) 1 MG tablet, Ambulatory referral to Psychology, venlafaxine XR (EFFEXOR-XR) 75 MG 24 hr capsule  Sweating increase  Heat intolerance - Plan: CBC, TSH  History of hyperlipidemia - Plan: Lipid panel  Class 1 obesity with body mass index (BMI) of 34.0 to 34.9 in adult, unspecified obesity type, unspecified whether serious comorbidity present - Plan: Comprehensive metabolic panel  Orders as above. Many of his symptoms likely stemming from uncontrolled anxiety. Change Cymbalta to Effexor. Add Klonopin, recommended he not use this for sleep, panic attacks or acute peaks in anxiety only. Pt is weaning from narcotics he is receiving from pain management. Number and referral made to psychology/counseling. Psych resources given.  Counseled on diet and exercise. F/u in 6 weeks pending labs. The patient voiced understanding and agreement to the plan.  [place], DO 05/04/16  11:34 AM

## 2016-05-04 NOTE — Patient Instructions (Addendum)
[facility] [address] [place], [place] [address] [phone]  [facility] [address] [place], [place] [address] [phone]  [facility] [place], [place] [address] [phone]  Dr. Sheralyn Boatman [address] [place], [place] [address] [phone]  Please call [phone] to schedule an appointment with counseling and determining insurance coverage.   Try not to use the Klonopin to sleep, rather if you are having a panic attacks or higher levels of anxiety.  Many of your symptoms are likely related to uncontrolled anxiety.

## 2016-05-07 ENCOUNTER — Telehealth: Payer: Self-pay | Admitting: *Deleted

## 2016-05-07 DIAGNOSIS — D75839 Thrombocytosis, unspecified: Secondary | ICD-10-CM

## 2016-05-07 DIAGNOSIS — D473 Essential (hemorrhagic) thrombocythemia: Secondary | ICD-10-CM

## 2016-05-07 NOTE — Telephone Encounter (Signed)
Called and spoke with the pt and informed him of the message below.  Pt verbalized understanding and agreed.  Pt was scheduled a lab appt for (Tues-05/08/16 @ 9:30am).  Future lab ordered and sent.//AB/CMA

## 2016-05-07 NOTE — Telephone Encounter (Signed)
-----   Message from Shelda Pal, DO sent at 05/05/2016  9:15 AM EDT ----- Isolated thrombocytosis. Hyperlipidemia, 10 yr CVD is 5.4%. Other labs unremarkable. MyChart message sent.   AB- please order a peripheral smear and schedule patient to return for lab visit at earliest convenience. He should be awaiting your call. TY.

## 2016-05-08 ENCOUNTER — Other Ambulatory Visit (INDEPENDENT_AMBULATORY_CARE_PROVIDER_SITE_OTHER): Payer: BLUE CROSS/BLUE SHIELD

## 2016-05-08 DIAGNOSIS — D473 Essential (hemorrhagic) thrombocythemia: Secondary | ICD-10-CM

## 2016-05-08 DIAGNOSIS — D75839 Thrombocytosis, unspecified: Secondary | ICD-10-CM

## 2016-05-09 ENCOUNTER — Encounter: Payer: Self-pay | Admitting: Family Medicine

## 2016-05-09 LAB — PATHOLOGIST SMEAR REVIEW

## 2016-05-11 ENCOUNTER — Encounter: Payer: Self-pay | Admitting: Family Medicine

## 2016-05-11 ENCOUNTER — Ambulatory Visit (HOSPITAL_BASED_OUTPATIENT_CLINIC_OR_DEPARTMENT_OTHER)
Admission: RE | Admit: 2016-05-11 | Discharge: 2016-05-11 | Disposition: A | Discharge status: Home / Self Care | Payer: BLUE CROSS/BLUE SHIELD | Source: Ambulatory Visit | Attending: Family Medicine | Admitting: Family Medicine

## 2016-05-11 ENCOUNTER — Ambulatory Visit (INDEPENDENT_AMBULATORY_CARE_PROVIDER_SITE_OTHER): Payer: BLUE CROSS/BLUE SHIELD | Admitting: Family Medicine

## 2016-05-11 VITALS — BP 126/84 | HR 100 | Temp 97.8°F | Ht 76.0 in | Wt 266.2 lb

## 2016-05-11 DIAGNOSIS — H9313 Tinnitus, bilateral: Secondary | ICD-10-CM

## 2016-05-11 DIAGNOSIS — R1032 Left lower quadrant pain: Secondary | ICD-10-CM | POA: Insufficient documentation

## 2016-05-11 DIAGNOSIS — H9193 Unspecified hearing loss, bilateral: Secondary | ICD-10-CM

## 2016-05-11 NOTE — Progress Notes (Signed)
Chief Complaint  Patient presents with  . Stomach issues    pt stated the issues are getting worse    Kurt Mcdonald is here for abdominal pain.  Duration: 6 months Nighttime awakenings? Yes- has poor sleep at baseline Bleeding? No Weight loss? Yes Palliation: Laying down, coconut vitamin water, BM's help Provocation: soft drinks, junk/fatty foods, smells Associated symptoms: nausea Denies: fever, vomiting and diarrhea Treatment to date: Zantac, Tums, Pepto-Bismol  Pt also notes that he is having some hearing loss per his wife as well as b/l ringing in ears. This is getting worse than in past. No drainage, fevers, or pain. He would like to see an ENT.  ROS: Constitutional: No fevers GI: +nausea, no bleeding +abd pain  Past Medical History:  Diagnosis Date  . Anxiety   . Chicken pox   . Chronic pain syndrome   . Depression   . Diverticulitis of colon without hemorrhage 06/04/2013  . Diverticulosis of colon   . Headache   . History of pneumonia   . Hypercholesteremia   . Irritable bowel syndrome (IBS)   . LBP (low back pain)   . Male hypogonadism   . NAFLD (nonalcoholic fatty liver disease)   . Overweight(278.02)   . Vision abnormalities    Family History  Problem Relation Age of Onset  . Healthy Mother        Living  . Pneumonia Father 31       Deceased  . Bone cancer Paternal Grandfather   . Diabetes Maternal Grandfather   . Healthy Brother   . Healthy Son        x1  . Healthy Daughter        x2   Past Surgical History:  Procedure Laterality Date  . shoulder sugery  01/2010   right  . TOOTH EXTRACTION  11/2011  . WISDOM TOOTH EXTRACTION      BP 120/90 (BP Location: Left Arm, Patient Position: Sitting, Cuff Size: Large)   Pulse 100   Temp 97.8 F (36.6 C) (Oral)   Ht 6\' 4"  (1.93 m)   Wt 266 lb 3.2 oz (120.7 kg)   SpO2 98%   BMI 32.40 kg/m  Gen.: Awake, alert, appears stated age 51: Mucous membranes moist without mucosal lesions, ears patent  b/l, TM's mildly retracted, neg otherwise. Heart: Regular rate and rhythm without murmurs Lungs: Clear auscultation bilaterally, no rales or wheezing, normal effort without accessory muscle use. Abdomen: Bowel sounds are present. Abdomen is soft, TTP in lower quadrants, worse in LLQ, some TTP in LUQ, nondistended, no masses or organomegaly. Negative Murphy's, Rovsing's, McBurney's, and Carnett's sign. Psych: Age appropriate judgment and insight. Normal mood and affect.  Left lower quadrant pain - Plan: DG Abd 2 Views  Tinnitus of both ears - Plan: Ambulatory referral to ENT  Bilateral hearing loss, unspecified hearing loss type - Plan: Ambulatory referral to ENT  Orders as above. Likely 2/2 constipation/gas based on AXR. Miralax 1-2 times daily for 3 days, enema if no improvement. Refer to ENT for tinnitus and hearing loss. F/u prn. Pt voiced understanding and agreement to the plan.  [place], DO 05/11/16 10:04 AM

## 2016-05-11 NOTE — Patient Instructions (Addendum)
If you do not hear anything about your ENT referral in the next 1-2 weeks, call our office and ask for an update.  Use Miralax 1-2 times daily over next 2-3 days. Use an enema if no improvement after that. Seek care if bleeding, fevers, or worsening symptoms.

## 2016-06-05 ENCOUNTER — Ambulatory Visit: Payer: BLUE CROSS/BLUE SHIELD | Admitting: Psychology

## 2016-06-08 ENCOUNTER — Ambulatory Visit (INDEPENDENT_AMBULATORY_CARE_PROVIDER_SITE_OTHER): Payer: BLUE CROSS/BLUE SHIELD | Admitting: Psychology

## 2016-06-08 DIAGNOSIS — F331 Major depressive disorder, recurrent, moderate: Secondary | ICD-10-CM

## 2016-06-15 ENCOUNTER — Encounter: Payer: Self-pay | Admitting: Family Medicine

## 2016-06-15 ENCOUNTER — Ambulatory Visit (INDEPENDENT_AMBULATORY_CARE_PROVIDER_SITE_OTHER): Payer: BLUE CROSS/BLUE SHIELD | Admitting: Family Medicine

## 2016-06-15 VITALS — BP 118/78 | HR 92 | Temp 97.9°F | Ht 76.0 in | Wt 270.4 lb

## 2016-06-15 DIAGNOSIS — S71151A Open bite, right thigh, initial encounter: Secondary | ICD-10-CM

## 2016-06-15 DIAGNOSIS — F329 Major depressive disorder, single episode, unspecified: Secondary | ICD-10-CM

## 2016-06-15 DIAGNOSIS — R252 Cramp and spasm: Secondary | ICD-10-CM | POA: Diagnosis not present

## 2016-06-15 DIAGNOSIS — F32A Depression, unspecified: Secondary | ICD-10-CM

## 2016-06-15 DIAGNOSIS — F419 Anxiety disorder, unspecified: Secondary | ICD-10-CM | POA: Diagnosis not present

## 2016-06-15 DIAGNOSIS — W540XXA Bitten by dog, initial encounter: Secondary | ICD-10-CM | POA: Diagnosis not present

## 2016-06-15 LAB — COMPREHENSIVE METABOLIC PANEL
ALT: 20 U/L (ref 0–53)
AST: 17 U/L (ref 0–37)
Albumin: 4.3 g/dL (ref 3.5–5.2)
Alkaline Phosphatase: 59 U/L (ref 39–117)
BUN: 14 mg/dL (ref 6–23)
CHLORIDE: 105 meq/L (ref 96–112)
CO2: 28 meq/L (ref 19–32)
Calcium: 9.8 mg/dL (ref 8.4–10.5)
Creatinine, Ser: 1.27 mg/dL (ref 0.40–1.50)
GFR: 63.55 mL/min (ref >60.00)
GLUCOSE: 81 mg/dL (ref 70–99)
POTASSIUM: 3.7 meq/L (ref 3.5–5.1)
SODIUM: 141 meq/L (ref 135–145)
TOTAL PROTEIN: 7.1 g/dL (ref 6.0–8.3)
Total Bilirubin: 0.6 mg/dL (ref 0.2–1.2)

## 2016-06-15 LAB — CBC
HEMATOCRIT: 43.2 % (ref 39.0–52.0)
HEMOGLOBIN: 14.6 g/dL (ref 13.0–17.0)
MCHC: 33.7 g/dL (ref 30.0–36.0)
MCV: 89.9 fl (ref 78.0–100.0)
PLATELETS: 418 10*3/uL — AB (ref 150.0–400.0)
RBC: 4.81 Mil/uL (ref 4.22–5.81)
RDW: 14.2 % (ref 11.5–15.5)
WBC: 7.4 10*3/uL (ref 4.0–10.5)

## 2016-06-15 LAB — IBC PANEL
Iron: 71 ug/dL (ref 42–165)
SATURATION RATIOS: 16.6 % — AB (ref 20.0–50.0)
Transferrin: 305 mg/dL (ref 212.0–360.0)

## 2016-06-15 LAB — MAGNESIUM: Magnesium: 2.2 mg/dL (ref 1.5–2.5)

## 2016-06-15 LAB — FERRITIN: Ferritin: 33.4 ng/mL (ref 22.0–322.0)

## 2016-06-15 MED ORDER — VENLAFAXINE HCL ER 37.5 MG PO CP24: 37.5000 mg | ORAL_CAPSULE | Freq: Every day | ORAL | 2 refills | Status: DC

## 2016-06-15 MED ORDER — AMOXICILLIN-POT CLAVULANATE 875-125 MG PO TABS
1.0000 | ORAL_TABLET | Freq: Two times a day (BID) | ORAL | 0 refills | Status: DC
Start: 2016-06-15 — End: 2016-07-27

## 2016-06-15 NOTE — Progress Notes (Signed)
Chief Complaint  Patient presents with  . Follow-up    6 weeks on anxiety-pt states doing better  . Animal Bite    on Mon-back of (R) upper leg    Subjective Kurt Mcdonald presents for f/u anxiety/depression.  Reports 80% improvement since treatemnt- he has plateaued. Has tried Cymbalta in past; currently on Effexor 150 mg XR daily, Klonopin 1 mg BID prn. He has been going to counseling with Terri and reports good results so far. Has failed Cymbalta. Reports healthy diet overall. Reports getting routine exercise. No thoughts of harming self or others. No self-medication with alcohol, prescription drugs or illicit drugs.  A dog bit him in the back of his R thigh while he was at work 4 days ago. It did not look rabid, owner unsure of immunizations for canine. No fevers, redness, drainage but it is hurting.  He also brought up leg and feet cramping. He maintains a good PO intake of fluids. No injury.  ROS Psych: No homicidal or suicidal thoughts Skin: +bite  Past Medical History:  Diagnosis Date  . Anxiety   . Chicken pox   . Chronic pain syndrome   . Depression   . Diverticulitis of colon without hemorrhage 06/04/2013  . Diverticulosis of colon   . Headache   . History of pneumonia   . Hypercholesteremia   . Irritable bowel syndrome (IBS)   . LBP (low back pain)   . Male hypogonadism   . NAFLD (nonalcoholic fatty liver disease)   . Overweight(278.02)   . Vision abnormalities    Family History  Problem Relation Age of Onset  . Healthy Mother        Living  . Pneumonia Father 62       Deceased  . Bone cancer Paternal Grandfather   . Diabetes Maternal Grandfather   . Healthy Brother   . Healthy Son        x1  . Healthy Daughter        x2   Allergies as of 06/15/2016      Reactions   Butrans [buprenorphine]    Felt funny   Crestor [rosuvastatin Calcium]    Severe leg cramping   Sulfa Antibiotics    rash      Medication List       Accurate as of 06/15/16  12:40 PM. Always use your most recent med list.          albuterol 108 (90 Base) MCG/ACT inhaler Commonly known as:  PROVENTIL HFA;VENTOLIN HFA Inhale 2 puffs into the lungs every 4 (four) hours as needed for wheezing or shortness of breath.   amoxicillin-clavulanate 875-125 MG tablet Commonly known as:  AUGMENTIN Take 1 tablet by mouth 2 (two) times daily.   clonazePAM 1 MG tablet Commonly known as:  KLONOPIN Take 1 tablet (1 mg total) by mouth 2 (two) times daily as needed.   cyclobenzaprine 10 MG tablet Commonly known as:  FLEXERIL Take 1 tablet by mouth as needed.   HYDROcodone-acetaminophen 10-325 MG tablet Commonly known as:  NORCO   hyoscyamine 0.125 MG tablet Commonly known as:  LEVSIN Take 1-2 capsules by mouth every 4 hours as needed   ibuprofen 800 MG tablet Commonly known as:  ADVIL,MOTRIN Take 1 tablet by mouth as needed.   METAMUCIL FIBER PO Take 1 Dose by mouth 3 (three) times daily.   Plecanatide 3 MG Tabs Commonly known as:  TRULANCE Take 1 capsule by mouth daily.   promethazine 25 MG  tablet Commonly known as:  PHENERGAN Take 1 tablet (25 mg total) by mouth every 8 (eight) hours as needed for nausea or vomiting.   topiramate 50 MG tablet Commonly known as:  TOPAMAX TAKE 1 TABLET(50 MG) BY MOUTH TWICE DAILY   traZODone 50 MG tablet Commonly known as:  DESYREL Take 1 tablet (50 mg total) by mouth at bedtime as needed for sleep.   Venlafaxine HCl 150 MG Tb24 Take 1 tablet by mouth daily.   venlafaxine XR 37.5 MG 24 hr capsule Commonly known as:  EFFEXOR XR Take 1 capsule (37.5 mg total) by mouth daily with breakfast.       Exam BP 118/78 (BP Location: Left Arm, Patient Position: Sitting, Cuff Size: Large)   Pulse 92   Temp 97.9 F (36.6 C) (Oral)   Ht 6\' 4"  (1.93 m)   Wt 270 lb 6.4 oz (122.7 kg)   SpO2 98%   BMI 32.91 kg/m  General:  well developed, well nourished, in no apparent distress Neck: neck supple without adenopathy,  thyromegaly, or masses Lungs:  clear to auscultation, breath sounds equal bilaterally, no respiratory distress Cardio:  regular rate and rhythm without murmurs, heart sounds without clicks or rubs Skin: R posterior and distal thigh, there is a now closed wound with minimal surrounding erythema, no warmth, fluctuance or drainage Psych: well oriented with normal range of affect and age-appropriate judgement/insight, alert and oriented x4.  Assessment and Plan  Anxiety and depression - Plan: venlafaxine XR (EFFEXOR XR) 37.5 MG 24 hr capsule, traZODone (DESYREL) 50 MG tablet  Dog bite, initial encounter - Plan: amoxicillin-clavulanate (AUGMENTIN) 875-125 MG tablet  Leg cramping - Plan: Comprehensive metabolic panel, CBC, IBC panel, Ferritin, Magnesium  Orders as above. Increase dose of daily effexor to 187.5 mg daily from 150 mg. Cont with counseling. Things seem much improved. Counseled on diet and exercise affecting mood. Healthy diet handout given. Call owner of dog for evaluation and to see if the dog is UTD with imms or if there should be a vet eval.  He is still having fatigue, needs to reach out to OSA provider, see about getting new mask or if they can pull any strings with getting insurance to cover a mask that the supplier made an error with.  Stay well hydrated. F/u in 6 weeks to recheck mood- will likely add BuSpar if still not at goal. The patient voiced understanding and agreement to the plan.  [place], DO 06/15/16 12:40 PM

## 2016-06-15 NOTE — Patient Instructions (Addendum)
Call your sleep doctor and let them know about your issue with the CPAP supplies. This is why you are fatigued.  Call owner of dog and make sure it has an evaluation to rule out other diseases.  Give [place] 2-3 business days to get the results of your labs back.   Healthy Eating Plan Many factors influence your heart health, including eating and exercise habits. Heart (coronary) risk increases with abnormal blood fat (lipid) levels. Heart-healthy meal planning includes limiting unhealthy fats, increasing healthy fats, and making other small dietary changes. This includes maintaining a healthy body weight to help keep lipid levels within a normal range.  WHAT IS MY PLAN?  Your health care provider recommends that you:  Drink a glass of water before meals to help with satiety.  Eat slowly.  An alternative to the water is to add Metamucil. This will help with satiety as well. It does contain calories, unlike water.  WHAT TYPES OF FAT SHOULD I CHOOSE?  Choose healthy fats more often. Choose monounsaturated and polyunsaturated fats, such as olive oil and canola oil, flaxseeds, walnuts, almonds, and seeds.  Eat more omega-3 fats. Good choices include salmon, mackerel, sardines, tuna, flaxseed oil, and ground flaxseeds. Aim to eat fish at least two times each week.  Avoid foods with partially hydrogenated oils in them. These contain trans fats. Examples of foods that contain trans fats are stick margarine, some tub margarines, cookies, crackers, and other baked goods. If you are going to avoid a fat, this is the one to avoid!  WHAT GENERAL GUIDELINES DO I NEED TO FOLLOW?  Check food labels carefully to identify foods with trans fats. Avoid these types of options when possible.  Fill one half of your plate with vegetables and green salads. Eat 4-5 servings of vegetables per day. A serving of vegetables equals 1 cup of raw leafy vegetables,  cup of raw or cooked cut-up vegetables, or  cup of  vegetable juice.  Fill one fourth of your plate with whole grains. Look for the word "whole" as the first word in the ingredient list.  Fill one fourth of your plate with lean protein foods.  Eat 4-5 servings of fruit per day. A serving of fruit equals one medium whole fruit,  cup of dried fruit,  cup of fresh, frozen, or canned fruit. Try to avoid fruits in cups/syrups as the sugar content can be high.  Eat more foods that contain soluble fiber. Examples of foods that contain this type of fiber are apples, broccoli, carrots, beans, peas, and barley. Aim to get 20-30 g of fiber per day.  Eat more home-cooked food and less restaurant, buffet, and fast food.  Limit or avoid alcohol.  Limit foods that are high in starch and sugar.  Avoid fried foods when able.  Cook foods by using methods other than frying. Baking, boiling, grilling, and broiling are all great options. Other fat-reducing suggestions include: ? Removing the skin from poultry. ? Removing all visible fats from meats. ? Skimming the fat off of stews, soups, and gravies before serving them. ? Steaming vegetables in water or broth.  Lose weight if you are overweight. Losing just 5-10% of your initial body weight can help your overall health and prevent diseases such as diabetes and heart disease.  Increase your consumption of nuts, legumes, and seeds to 4-5 servings per week. One serving of dried beans or legumes equals  cup after being cooked, one serving of nuts equals 1 ounces, and  one serving of seeds equals  ounce or 1 tablespoon.  WHAT ARE GOOD FOODS CAN I EAT? Grains Grainy breads (try to find bread that is 3 g of fiber per slice or greater), oatmeal, light popcorn. Whole-grain cereals. Rice and pasta, including brown rice and those that are made with whole wheat. Edamame pasta is a great alternative to grain pasta. It has a higher protein content. Try to avoid significant consumption of white bread, sugary cereals,  or pastries/baked goods.  Vegetables All vegetables. Cooked white potatoes do not count as vegetables.  Fruits All fruits, but limit pineapple and bananas as these fruits have a higher sugar content.  Meats and Other Protein Sources Lean, well-trimmed beef, veal, pork, and lamb. Chicken and [place] without skin. All fish and shellfish. Wild duck, rabbit, pheasant, and venison. Egg whites or low-cholesterol egg substitutes. Dried beans, peas, lentils, and tofu.Seeds and most nuts.  Dairy Low-fat or nonfat cheeses, including ricotta, string, and mozzarella. Skim or 1% milk that is liquid, powdered, or evaporated. Buttermilk that is made with low-fat milk. Nonfat or low-fat yogurt. Soy/Almond milk are good alternatives if you cannot handle dairy.  Beverages Water is the best for you. Sports drinks with less sugar are more desirable unless you are a highly active athlete.  Sweets and Desserts Sherbets and fruit ices. Honey, jam, marmalade, jelly, and syrups. Dark chocolate.  Eat all sweets and desserts in moderation.  Fats and Oils Nonhydrogenated (trans-free) margarines. Vegetable oils, including soybean, sesame, sunflower, olive, peanut, safflower, corn, canola, and cottonseed. Salad dressings or mayonnaise that are made with a vegetable oil. Limit added fats and oils that you use for cooking, baking, salads, and as spreads.  Other Cocoa powder. Coffee and tea. Most condiments.  The items listed above may not be a complete list of recommended foods or beverages. Contact your dietitian for more options.

## 2016-06-21 ENCOUNTER — Encounter: Payer: Self-pay | Admitting: Family Medicine

## 2016-06-22 ENCOUNTER — Encounter: Payer: Self-pay | Admitting: Family Medicine

## 2016-06-22 ENCOUNTER — Ambulatory Visit (INDEPENDENT_AMBULATORY_CARE_PROVIDER_SITE_OTHER): Payer: BLUE CROSS/BLUE SHIELD | Admitting: Psychology

## 2016-06-22 DIAGNOSIS — F331 Major depressive disorder, recurrent, moderate: Secondary | ICD-10-CM | POA: Diagnosis not present

## 2016-06-22 NOTE — Telephone Encounter (Signed)
Please advise.//AB/CMA 

## 2016-07-13 ENCOUNTER — Ambulatory Visit: Payer: BLUE CROSS/BLUE SHIELD | Admitting: Psychology

## 2016-07-26 ENCOUNTER — Encounter: Payer: Self-pay | Admitting: Gastroenterology

## 2016-07-27 ENCOUNTER — Ambulatory Visit (INDEPENDENT_AMBULATORY_CARE_PROVIDER_SITE_OTHER): Payer: BLUE CROSS/BLUE SHIELD | Admitting: Family Medicine

## 2016-07-27 ENCOUNTER — Encounter: Payer: Self-pay | Admitting: Family Medicine

## 2016-07-27 VITALS — BP 112/84 | HR 90 | Temp 97.7°F | Ht 76.0 in | Wt 267.4 lb

## 2016-07-27 DIAGNOSIS — F419 Anxiety disorder, unspecified: Secondary | ICD-10-CM | POA: Diagnosis not present

## 2016-07-27 NOTE — Progress Notes (Signed)
Chief Complaint  Patient presents with  . Follow-up    6 weeks recheck mood    Subjective Kurt Mcdonald presents for f/u anxiety/depression.  Reports improvement since treatment.His anger is much improved after starting the Effexor. He has used Klonopin 1-2 times per week. Has tried Cymbalta, was seeing Terri and feels like he has plateaued with her, does not have immediate plans on going back. He was given psych resources, has not yet called.  Reports healthy diet overall. Reports getting routine exercise/physical activity at work. No thoughts of harming self or others. No self-medication with alcohol, prescription drugs or illicit drugs. Has to wait until the end of year for new CPAP supplies, still having morning fatigue and daytime napping. He will intermittently have to pull over to the side of the road because he is so sleepy. His sleep study showed obstructive sleep apnea, however he received the wrong mask. Because he never used it, his insurance is refusing to pay for it.  [facility] Psych: No homicidal or suicidal thoughts  Past Medical History:  Diagnosis Date  . Anxiety   . Chicken pox   . Chronic pain syndrome   . Depression   . Diverticulitis of colon without hemorrhage 06/04/2013  . Diverticulosis of colon   . Headache   . History of pneumonia   . Hypercholesteremia   . Irritable bowel syndrome (IBS)   . LBP (low back pain)   . Male hypogonadism   . NAFLD (nonalcoholic fatty liver disease)   . Overweight(278.02)   . Vision abnormalities    Family History  Problem Relation Age of Onset  . Healthy Mother        Living  . Pneumonia Father 37       Deceased  . Bone cancer Paternal Grandfather   . Diabetes Maternal Grandfather   . Healthy Brother   . Healthy Son        x1  . Healthy Daughter        x2   Allergies as of 07/27/2016      Reactions   Butrans [buprenorphine]    Felt funny   Crestor [rosuvastatin Calcium]    Severe leg cramping   Sulfa  Antibiotics    rash      Medication List       Accurate as of 07/27/16 11:43 AM. Always use your most recent med list.          albuterol 108 (90 Base) MCG/ACT inhaler Commonly known as:  PROVENTIL HFA;VENTOLIN HFA Inhale 2 puffs into the lungs every 4 (four) hours as needed for wheezing or shortness of breath.   clonazePAM 1 MG tablet Commonly known as:  KLONOPIN Take 1 tablet (1 mg total) by mouth 2 (two) times daily as needed.   cyclobenzaprine 10 MG tablet Commonly known as:  FLEXERIL Take 1 tablet by mouth as needed.   HYDROcodone-acetaminophen 10-325 MG tablet Commonly known as:  NORCO   hyoscyamine 0.125 MG tablet Commonly known as:  LEVSIN Take 1-2 capsules by mouth every 4 hours as needed   ibuprofen 800 MG tablet Commonly known as:  ADVIL,MOTRIN Take 1 tablet by mouth as needed.   METAMUCIL FIBER PO Take 1 Dose by mouth 3 (three) times daily.   Plecanatide 3 MG Tabs Commonly known as:  TRULANCE Take 1 capsule by mouth daily.   promethazine 25 MG tablet Commonly known as:  PHENERGAN Take 1 tablet (25 mg total) by mouth every 8 (eight) hours as needed  for nausea or vomiting.   topiramate 50 MG tablet Commonly known as:  TOPAMAX TAKE 1 TABLET(50 MG) BY MOUTH TWICE DAILY   traZODone 50 MG tablet Commonly known as:  DESYREL Take 1 tablet (50 mg total) by mouth at bedtime as needed for sleep.   Venlafaxine HCl 150 MG Tb24 Take 1 tablet by mouth daily.   venlafaxine XR 37.5 MG 24 hr capsule Commonly known as:  EFFEXOR XR Take 1 capsule (37.5 mg total) by mouth daily with breakfast.       Exam BP 112/84 (BP Location: Left Arm, Patient Position: Sitting, Cuff Size: Large)   Pulse 90   Temp 97.7 F (36.5 C) (Oral)   Ht 6\' 4"  (1.93 m)   Wt 267 lb 6.4 oz (121.3 kg)   SpO2 98%   BMI 32.55 kg/m  General:  well developed, well nourished, in no apparent distress Neck: neck supple without adenopathy, thyromegaly, or masses Lungs:  clear to  auscultation, breath sounds equal bilaterally, no respiratory distress Cardio:  regular rate and rhythm without murmurs, heart sounds without clicks or rubs Psych: well oriented with normal range of affect and age-appropriate judgement/insight, alert and oriented x4.  Assessment and Plan  Anxiety  Continue as needed Klonopin and Effexor 187.5 mg daily. He wishes to keep things the same. He is also on prn Trazodone and takes 1/2 tab if needed. Counseled on diet and exercise affecting mood. F/u in in 3 mo, cancel if he is set up with psych. The patient voiced understanding and agreement to the plan.  [place], DO 07/27/16 11:43 AM

## 2016-07-27 NOTE — Patient Instructions (Signed)
Elevate the head of your bed.   Try calling pulmonologist to see if anything can be done about the CPAP mask.  Continue getting regular exercise in and eating clean. The more weight you lose, the more energy you will have and may even fix your sleep apnea issue.  [facility] [address] [place], [place] [address] [place], [place] [address] [phone]  [facility] [address] [place], [place] [address] [phone]  Dr. Sheralyn Boatman [address] [place], [place] [address] [phone]

## 2016-09-02 ENCOUNTER — Other Ambulatory Visit: Payer: Self-pay | Admitting: Family Medicine

## 2016-09-02 DIAGNOSIS — F419 Anxiety disorder, unspecified: Principal | ICD-10-CM

## 2016-09-02 DIAGNOSIS — F329 Major depressive disorder, single episode, unspecified: Secondary | ICD-10-CM

## 2016-09-02 DIAGNOSIS — F32A Depression, unspecified: Secondary | ICD-10-CM

## 2016-09-04 NOTE — Telephone Encounter (Signed)
Called left message to call back 

## 2016-09-04 NOTE — Telephone Encounter (Signed)
Pt told me he was using 1-2 times per week on average. Unless this has changed, he likely has 2 refills. TY.

## 2016-09-04 NOTE — Telephone Encounter (Signed)
Requesting:   clonazepam Contract   05/05/2014 UDS   High risk on 11/2014 Last OV     07/27/2016 Last Refill    #60 with 2 refills on 05/04/2016  Please Advise

## 2016-09-05 NOTE — Telephone Encounter (Signed)
Patient notified and did agree

## 2016-09-11 ENCOUNTER — Telehealth: Payer: Self-pay | Admitting: Family Medicine

## 2016-09-11 NOTE — Telephone Encounter (Signed)
°  Relation to HU:DJSH  Call back number: [phone]   Reason for call:  Patient states he's experiencing memory lapse, anxiety and affecting he's work due to taking venlafaxine XR (EFFEXOR XR) 37.5 MG 24 hr capsule patient states he would like to go back on Cymbalta

## 2016-09-12 MED ORDER — DULOXETINE HCL 60 MG PO CPEP: 60.0000 mg | ORAL_CAPSULE | Freq: Every day | ORAL | 0 refills | Status: DC

## 2016-09-12 NOTE — Telephone Encounter (Signed)
Pt aware to Kurt Mcdonald/C Venlafaxine 37.5mg  and Start Cymbalta 60mg /faxed #30 to [facility] on file and he will advise the one in [place] where he is know so that they can transfer it/thx dmf

## 2016-09-12 NOTE — Telephone Encounter (Signed)
OK to change to Cymbalta 60 mg daily. TY.

## 2016-09-12 NOTE — Telephone Encounter (Signed)
Please advise 

## 2016-10-03 ENCOUNTER — Encounter: Payer: Self-pay | Admitting: Family Medicine

## 2016-10-03 ENCOUNTER — Ambulatory Visit (INDEPENDENT_AMBULATORY_CARE_PROVIDER_SITE_OTHER): Payer: BLUE CROSS/BLUE SHIELD | Admitting: Family Medicine

## 2016-10-03 VITALS — BP 122/84 | HR 112 | Temp 97.7°F | Ht 76.0 in | Wt 257.0 lb

## 2016-10-03 DIAGNOSIS — F419 Anxiety disorder, unspecified: Secondary | ICD-10-CM

## 2016-10-03 DIAGNOSIS — F329 Major depressive disorder, single episode, unspecified: Secondary | ICD-10-CM | POA: Diagnosis not present

## 2016-10-03 DIAGNOSIS — F32A Depression, unspecified: Secondary | ICD-10-CM

## 2016-10-03 MED ORDER — BUPROPION HCL 75 MG PO TABS
75.0000 mg | ORAL_TABLET | Freq: Two times a day (BID) | ORAL | 2 refills | Status: DC
Start: 2016-10-03 — End: 2016-10-24

## 2016-10-03 MED ORDER — CLONAZEPAM 1 MG PO TABS: 1.0000 mg | ORAL_TABLET | Freq: Two times a day (BID) | ORAL | 2 refills | Status: DC | PRN

## 2016-10-03 MED ORDER — ESCITALOPRAM OXALATE 10 MG PO TABS: 10.0000 mg | ORAL_TABLET | Freq: Every day | ORAL | 2 refills | Status: DC

## 2016-10-03 NOTE — Progress Notes (Signed)
Pre visit review using our clinic review tool, if applicable. No additional management support is needed unless otherwise documented below in the visit note. 

## 2016-10-03 NOTE — Patient Instructions (Addendum)
When you feel a headache come on, take 2 Aleve. Repeat in 2 hours if not effective. Do not do this more than once in a 24 hour period.  Aim to do some physical exertion for 150 minutes per week. This is typically divided into 5 days per week, 30 minutes per day. The activity should be enough to get your heart rate up. Anything is better than nothing if you have time constraints.  [facility] [address] [place], [place] [address] [phone]  [facility] [address] [place], [place] [address] [place], [place] [address] [phone]  Dr. Sheralyn Boatman [address], [place], [place] [address] [phone]  EXERCISES RANGE OF MOTION (ROM) AND STRETCHING EXERCISES  These exercises may help you when beginning to rehabilitate your issue. In order to successfully resolve your symptoms, you must improve your posture. These exercises are designed to help reduce the forward-head and rounded-shoulder posture which contributes to this condition. Your symptoms may resolve with or without further involvement from your physician, physical therapist or athletic trainer. While completing these exercises, remember:   Restoring tissue flexibility helps normal motion to return to the joints. This allows healthier, less painful movement and activity.  An effective stretch should be held for at least 20 seconds, although you may need to begin with shorter hold times for comfort.  A stretch should never be painful. You should only feel a gentle lengthening or release in the stretched tissue.  Do not do any stretch or exercise that you cannot tolerate.  STRETCH- Axial Extensors  Lie on your back on the floor. You may bend your knees for comfort. Place a rolled-up hand towel or dish towel, about 2 inches in diameter, under the part of your head that makes contact with the floor.  Gently  tuck your chin, as if trying to make a "double chin," until you feel a gentle stretch at the base of your head.  Hold 15-20 seconds. Repeat 2-3 times. Complete this exercise 1 time per day.   STRETCH - Axial Extension   Stand or sit on a firm surface. Assume a good posture: chest up, shoulders drawn back, abdominal muscles slightly tense, knees unlocked (if standing) and feet hip width apart.  Slowly retract your chin so your head slides back and your chin slightly lowers. Continue to look straight ahead.  You should feel a gentle stretch in the back of your head. Be certain not to feel an aggressive stretch since this can cause headaches later.  Hold for 15-20 seconds. Repeat 2-3 times. Complete this exercise 1 time per day.  STRETCH - Cervical Side Bend   Stand or sit on a firm surface. Assume a good posture: chest up, shoulders drawn back, abdominal muscles slightly tense, knees unlocked (if standing) and feet hip width apart.  Without letting your nose or shoulders move, slowly tip your right / left ear to your shoulder until your feel a gentle stretch in the muscles on the opposite side of your neck.  Hold 15-20 seconds. Repeat 2-3 times. Complete this exercise 1-2 times per day.  STRETCH - Cervical Rotators   Stand or sit on a firm surface. Assume a good posture: chest up, shoulders drawn back, abdominal muscles slightly tense, knees unlocked (if standing) and feet hip width apart.  Keeping your eyes level with the ground, slowly turn your head until you feel a gentle stretch along the  back and opposite side of your neck.  Hold 15-20 seconds. Repeat 2-3 times. Complete this exercise 1-2 times per day.  RANGE OF MOTION - Neck Circles   Stand or sit on a firm surface. Assume a good posture: chest up, shoulders drawn back, abdominal muscles slightly tense, knees unlocked (if standing) and feet hip width apart.  Gently roll your head down and around from the back of one  shoulder to the back of the other. The motion should never be forced or painful.  Repeat the motion 10-20 times, or until you feel the neck muscles relax and loosen. Repeat 2-3 times. Complete the exercise 1-2 times per day. STRENGTHENING EXERCISES - Cervical Strain and Sprain These exercises may help you when beginning to rehabilitate your injury. They may resolve your symptoms with or without further involvement from your physician, physical therapist, or athletic trainer. While completing these exercises, remember:   Muscles can gain both the endurance and the strength needed for everyday activities through controlled exercises.  Complete these exercises as instructed by your physician, physical therapist, or athletic trainer. Progress the resistance and repetitions only as guided.  You may experience muscle soreness or fatigue, but the pain or discomfort you are trying to eliminate should never worsen during these exercises. If this pain does worsen, stop and make certain you are following the directions exactly. If the pain is still present after adjustments, discontinue the exercise until you can discuss the trouble with your clinician.  STRENGTH - Cervical Flexors, Isometric  Face a wall, standing about 6 inches away. Place a small pillow, a ball about 6-8 inches in diameter, or a folded towel between your forehead and the wall.  Slightly tuck your chin and gently push your forehead into the soft object. Push only with mild to moderate intensity, building up tension gradually. Keep your jaw and forehead relaxed.  Hold 10 to 20 seconds. Keep your breathing relaxed.  Release the tension slowly. Relax your neck muscles completely before you start the next repetition. Repeat 2-3 times. Complete this exercise 1 time per day.  STRENGTH- Cervical Lateral Flexors, Isometric   Stand about 6 inches away from a wall. Place a small pillow, a ball about 6-8 inches in diameter, or a folded towel  between the side of your head and the wall.  Slightly tuck your chin and gently tilt your head into the soft object. Push only with mild to moderate intensity, building up tension gradually. Keep your jaw and forehead relaxed.  Hold 10 to 20 seconds. Keep your breathing relaxed.  Release the tension slowly. Relax your neck muscles completely before you start the next repetition. Repeat 2-3 times. Complete this exercise 1 time per day.  STRENGTH - Cervical Extensors, Isometric   Stand about 6 inches away from a wall. Place a small pillow, a ball about 6-8 inches in diameter, or a folded towel between the back of your head and the wall.  Slightly tuck your chin and gently tilt your head back into the soft object. Push only with mild to moderate intensity, building up tension gradually. Keep your jaw and forehead relaxed.  Hold 10 to 20 seconds. Keep your breathing relaxed.  Release the tension slowly. Relax your neck muscles completely before you start the next repetition. Repeat 2-3 times. Complete this exercise 1 time per day.  POSTURE AND BODY MECHANICS CONSIDERATIONS Keeping correct posture when sitting, standing or completing your activities will reduce the stress put on different body tissues, allowing injured  tissues a chance to heal and limiting painful experiences. The following are general guidelines for improved posture. Your physician or physical therapist will provide you with any instructions specific to your needs. While reading these guidelines, remember:  The exercises prescribed by your provider will help you have the flexibility and strength to maintain correct postures.  The correct posture provides the optimal environment for your joints to work. All of your joints have less wear and tear when properly supported by a spine with good posture. This means you will experience a healthier, less painful body.  Correct posture must be practiced with all of your activities,  especially prolonged sitting and standing. Correct posture is as important when doing repetitive low-stress activities (typing) as it is when doing a single heavy-load activity (lifting).  PROLONGED STANDING WHILE SLIGHTLY LEANING FORWARD When completing a task that requires you to lean forward while standing in one place for a long time, place either foot up on a stationary 2- to 4-inch high object to help maintain the best posture. When both feet are on the ground, the low back tends to lose its slight inward curve. If this curve flattens (or becomes too large), then the back and your other joints will experience too much stress, fatigue more quickly, and can cause pain.   RESTING POSITIONS Consider which positions are most painful for you when choosing a resting position. If you have pain with flexion-based activities (sitting, bending, stooping, squatting), choose a position that allows you to rest in a less flexed posture. You would want to avoid curling into a fetal position on your side. If your pain worsens with extension-based activities (prolonged standing, working overhead), avoid resting in an extended position such as sleeping on your stomach. Most people will find more comfort when they rest with their spine in a more neutral position, neither too rounded nor too arched. Lying on a non-sagging bed on your side with a pillow between your knees, or on your back with a pillow under your knees will often provide some relief. Keep in mind, being in any one position for a prolonged period of time, no matter how correct your posture, can still lead to stiffness.  WALKING Walk with an upright posture. Your ears, shoulders, and hips should all line up. OFFICE WORK When working at a desk, create an environment that supports good, upright posture. Without extra support, muscles fatigue and lead to excessive strain on joints and other tissues.  CHAIR:  A chair should be able to slide under your desk  when your back makes contact with the back of the chair. This allows you to work closely.  The chair's height should allow your eyes to be level with the upper part of your monitor and your hands to be slightly lower than your elbows.  Body position: ? Your feet should make contact with the floor. If this is not possible, use a foot rest. ? Keep your ears over your shoulders. This will reduce stress on your neck and low back.
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
## 2016-10-08 ENCOUNTER — Encounter: Payer: Self-pay | Admitting: Family Medicine

## 2016-10-09 NOTE — Telephone Encounter (Signed)
Please schedule pt for nurse visit, 60 mg toradol IM. TY.

## 2016-10-12 ENCOUNTER — Ambulatory Visit (INDEPENDENT_AMBULATORY_CARE_PROVIDER_SITE_OTHER): Payer: BLUE CROSS/BLUE SHIELD

## 2016-10-12 DIAGNOSIS — R51 Headache: Secondary | ICD-10-CM

## 2016-10-12 DIAGNOSIS — R519 Headache, unspecified: Secondary | ICD-10-CM

## 2016-10-12 MED ORDER — KETOROLAC TROMETHAMINE 60 MG/2ML IM SOLN
60.0000 mg | Freq: Once | INTRAMUSCULAR | Status: AC
Start: 2016-10-12 — End: 2016-10-12
  Administered 2016-10-12: 60 mg via INTRAMUSCULAR

## 2016-10-12 MED ORDER — KETOROLAC TROMETHAMINE 30 MG/ML IJ SOLN: 30.0000 mg | Freq: Once | INTRAMUSCULAR | 0 refills | Status: AC

## 2016-10-12 NOTE — Progress Notes (Signed)
Noted. Agree with above.  Solen, DO 10/12/16 2:05 PM

## 2016-10-12 NOTE — Progress Notes (Signed)
Pre visit review using our clinic tool,if applicable. No additional management support is needed unless otherwise documented below in the visit note.   Patient in for Toradol injection due to headache per order dated 10/08/16 by  Dr. Deloris Ping.   Given IM Right ventrogluteal. Patient tolerated well.

## 2016-10-15 ENCOUNTER — Ambulatory Visit (INDEPENDENT_AMBULATORY_CARE_PROVIDER_SITE_OTHER): Payer: BLUE CROSS/BLUE SHIELD | Admitting: Family Medicine

## 2016-10-15 ENCOUNTER — Encounter: Payer: Self-pay | Admitting: Family Medicine

## 2016-10-15 VITALS — BP 120/88 | HR 79 | Temp 98.1°F | Ht 76.0 in | Wt 262.4 lb

## 2016-10-15 DIAGNOSIS — M542 Cervicalgia: Secondary | ICD-10-CM

## 2016-10-15 DIAGNOSIS — G44209 Tension-type headache, unspecified, not intractable: Secondary | ICD-10-CM | POA: Diagnosis not present

## 2016-10-15 MED ORDER — NAPROXEN 500 MG PO TABS: ORAL_TABLET | ORAL | 2 refills | Status: DC

## 2016-10-15 MED ORDER — PREDNISONE 10 MG (21) PO TBPK: ORAL_TABLET | ORAL | 0 refills | Status: DC

## 2016-10-15 NOTE — Patient Instructions (Addendum)
Aleve- 2 tabs onset of headache, repeat in 2 hours. Do this no more than once in a 24 hour period or twice in a week.  Call sleep doctor's office about CPAP.   Call [facility] about an appointment for your headache.   Let us know if you need anything.  EXERCISES RANGE OF MOTION (ROM) AND STRETCHING EXERCISES  These exercises may help you when beginning to rehabilitate your issue. In order to successfully resolve your symptoms, you must improve your posture. These exercises are designed to help reduce the forward-head and rounded-shoulder posture which contributes to this condition. Your symptoms may resolve with or without further involvement from your physician, physical therapist or athletic trainer. While completing these exercises, remember:   Restoring tissue flexibility helps normal motion to return to the joints. This allows healthier, less painful movement and activity.  An effective stretch should be held for at least 20 seconds, although you may need to begin with shorter hold times for comfort.  A stretch should never be painful. You should only feel a gentle lengthening or release in the stretched tissue.  Do not do any stretch or exercise that you cannot tolerate.  STRETCH- Axial Extensors  Lie on your back on the floor. You may bend your knees for comfort. Place a rolled-up hand towel or dish towel, about 2 inches in diameter, under the part of your head that makes contact with the floor.  Gently tuck your chin, as if trying to make a "double chin," until you feel a gentle stretch at the base of your head.  Hold 15-20 seconds. Repeat 2-3 times. Complete this exercise 1 time per day.   STRETCH - Axial Extension   Stand or sit on a firm surface. Assume a good posture: chest up, shoulders drawn back, abdominal muscles slightly tense, knees unlocked (if standing) and feet hip width apart.  Slowly retract your chin so your head slides back and your chin slightly  lowers. Continue to look straight ahead.  You should feel a gentle stretch in the back of your head. Be certain not to feel an aggressive stretch since this can cause headaches later.  Hold for 15-20 seconds. Repeat 2-3 times. Complete this exercise 1 time per day.  STRETCH - Cervical Side Bend   Stand or sit on a firm surface. Assume a good posture: chest up, shoulders drawn back, abdominal muscles slightly tense, knees unlocked (if standing) and feet hip width apart.  Without letting your nose or shoulders move, slowly tip your right / left ear to your shoulder until your feel a gentle stretch in the muscles on the opposite side of your neck.  Hold 15-20 seconds. Repeat 2-3 times. Complete this exercise 1-2 times per day.  STRETCH - Cervical Rotators   Stand or sit on a firm surface. Assume a good posture: chest up, shoulders drawn back, abdominal muscles slightly tense, knees unlocked (if standing) and feet hip width apart.  Keeping your eyes level with the ground, slowly turn your head until you feel a gentle stretch along the back and opposite side of your neck.  Hold 15-20 seconds. Repeat 2-3 times. Complete this exercise 1-2 times per day.  RANGE OF MOTION - Neck Circles   Stand or sit on a firm surface. Assume a good posture: chest up, shoulders drawn back, abdominal muscles slightly tense, knees unlocked (if standing) and feet hip width apart.  Gently roll your head down and around from the back of one shoulder to  the back of the other. The motion should never be forced or painful.  Repeat the motion 10-20 times, or until you feel the neck muscles relax and loosen. Repeat 2-3 times. Complete the exercise 1-2 times per day. STRENGTHENING EXERCISES - Cervical Strain and Sprain These exercises may help you when beginning to rehabilitate your injury. They may resolve your symptoms with or without further involvement from your physician, physical therapist, or athletic trainer.  While completing these exercises, remember:   Muscles can gain both the endurance and the strength needed for everyday activities through controlled exercises.  Complete these exercises as instructed by your physician, physical therapist, or athletic trainer. Progress the resistance and repetitions only as guided.  You may experience muscle soreness or fatigue, but the pain or discomfort you are trying to eliminate should never worsen during these exercises. If this pain does worsen, stop and make certain you are following the directions exactly. If the pain is still present after adjustments, discontinue the exercise until you can discuss the trouble with your clinician.  STRENGTH - Cervical Flexors, Isometric  Face a wall, standing about 6 inches away. Place a small pillow, a ball about 6-8 inches in diameter, or a folded towel between your forehead and the wall.  Slightly tuck your chin and gently push your forehead into the soft object. Push only with mild to moderate intensity, building up tension gradually. Keep your jaw and forehead relaxed.  Hold 10 to 20 seconds. Keep your breathing relaxed.  Release the tension slowly. Relax your neck muscles completely before you start the next repetition. Repeat 2-3 times. Complete this exercise 1 time per day.  STRENGTH- Cervical Lateral Flexors, Isometric   Stand about 6 inches away from a wall. Place a small pillow, a ball about 6-8 inches in diameter, or a folded towel between the side of your head and the wall.  Slightly tuck your chin and gently tilt your head into the soft object. Push only with mild to moderate intensity, building up tension gradually. Keep your jaw and forehead relaxed.  Hold 10 to 20 seconds. Keep your breathing relaxed.  Release the tension slowly. Relax your neck muscles completely before you start the next repetition. Repeat 2-3 times. Complete this exercise 1 time per day.  STRENGTH - Cervical Extensors,  Isometric   Stand about 6 inches away from a wall. Place a small pillow, a ball about 6-8 inches in diameter, or a folded towel between the back of your head and the wall.  Slightly tuck your chin and gently tilt your head back into the soft object. Push only with mild to moderate intensity, building up tension gradually. Keep your jaw and forehead relaxed.  Hold 10 to 20 seconds. Keep your breathing relaxed.  Release the tension slowly. Relax your neck muscles completely before you start the next repetition. Repeat 2-3 times. Complete this exercise 1 time per day.  POSTURE AND BODY MECHANICS CONSIDERATIONS Keeping correct posture when sitting, standing or completing your activities will reduce the stress put on different body tissues, allowing injured tissues a chance to heal and limiting painful experiences. The following are general guidelines for improved posture. Your physician or physical therapist will provide you with any instructions specific to your needs. While reading these guidelines, remember:  The exercises prescribed by your provider will help you have the flexibility and strength to maintain correct postures.  The correct posture provides the optimal environment for your joints to work. All of your joints have  less wear and tear when properly supported by a spine with good posture. This means you will experience a healthier, less painful body.  Correct posture must be practiced with all of your activities, especially prolonged sitting and standing. Correct posture is as important when doing repetitive low-stress activities (typing) as it is when doing a single heavy-load activity (lifting).  PROLONGED STANDING WHILE SLIGHTLY LEANING FORWARD When completing a task that requires you to lean forward while standing in one place for a long time, place either foot up on a stationary 2- to 4-inch high object to help maintain the best posture. When both feet are on the ground, the low  back tends to lose its slight inward curve. If this curve flattens (or becomes too large), then the back and your other joints will experience too much stress, fatigue more quickly, and can cause pain.   RESTING POSITIONS Consider which positions are most painful for you when choosing a resting position. If you have pain with flexion-based activities (sitting, bending, stooping, squatting), choose a position that allows you to rest in a less flexed posture. You would want to avoid curling into a fetal position on your side. If your pain worsens with extension-based activities (prolonged standing, working overhead), avoid resting in an extended position such as sleeping on your stomach. Most people will find more comfort when they rest with their spine in a more neutral position, neither too rounded nor too arched. Lying on a non-sagging bed on your side with a pillow between your knees, or on your back with a pillow under your knees will often provide some relief. Keep in mind, being in any one position for a prolonged period of time, no matter how correct your posture, can still lead to stiffness.  WALKING Walk with an upright posture. Your ears, shoulders, and hips should all line up. OFFICE WORK When working at a desk, create an environment that supports good, upright posture. Without extra support, muscles fatigue and lead to excessive strain on joints and other tissues.  CHAIR:  A chair should be able to slide under your desk when your back makes contact with the back of the chair. This allows you to work closely.  The chair's height should allow your eyes to be level with the upper part of your monitor and your hands to be slightly lower than your elbows.  Body position: ? Your feet should make contact with the floor. If this is not possible, use a foot rest. ? Keep your ears over your shoulders. This will reduce stress on your neck and low back.

## 2016-10-15 NOTE — Progress Notes (Addendum)
Chief Complaint  Patient presents with  . Headache    still hurting    Kurt Mcdonald is a 51 y.o. male here for evaluation of bilateral headache.  Duration: 2 months Palliation: Hydrocodone  Provocation: Stress  Going down middle of head also. He used to see neurology. He has been using copious amounts of anti-inflammatories and migraine headache medications. He has documented sleep apnea but is not using a CPAP. He received a Toradol injection last week that was not helpful. He does have neck pain associated with this as well.  Past Medical History:  Diagnosis Date  . Anxiety   . Chicken pox   . Chronic pain syndrome   . Depression   . Diverticulitis of colon without hemorrhage 06/04/2013  . Diverticulosis of colon   . Headache   . History of pneumonia   . Hypercholesteremia   . Irritable bowel syndrome (IBS)   . LBP (low back pain)   . Male hypogonadism   . NAFLD (nonalcoholic fatty liver disease)   . Overweight(278.02)   . Vision abnormalities    Family History  Problem Relation Age of Onset  . Healthy Mother        Living  . Pneumonia Father 79       Deceased  . Bone cancer Paternal Grandfather   . Diabetes Maternal Grandfather   . Healthy Brother   . Healthy Son        x1  . Healthy Daughter        x2   Current Meds  Medication Sig  . albuterol (PROVENTIL HFA;VENTOLIN HFA) 108 (90 Base) MCG/ACT inhaler Inhale 2 puffs into the lungs every 4 (four) hours as needed for wheezing or shortness of breath.  Marland Kitchen buPROPion (WELLBUTRIN) 75 MG tablet Take 1 tablet (75 mg total) by mouth 2 (two) times daily.  . clonazePAM (KLONOPIN) 1 MG tablet Take 1 tablet (1 mg total) by mouth 2 (two) times daily as needed.  Marland Kitchen escitalopram (LEXAPRO) 10 MG tablet Take 1 tablet (10 mg total) by mouth daily.  Marland Kitchen HYDROcodone-acetaminophen (NORCO) 10-325 MG tablet   . Psyllium (METAMUCIL FIBER PO) Take 1 Dose by mouth 3 (three) times daily.    BP 120/88 (BP Location: Left Arm, Patient  Position: Sitting, Cuff Size: Large)   Pulse 79   Temp 98.1 F (36.7 C) (Oral)   Ht 6\' 4"  (1.93 m)   Wt 262 lb 6 oz (119 kg)   SpO2 96%   BMI 31.94 kg/m  General: awake, alert, appearing stated age Eyes: PERRLA, EOMi Heart: RRR, no murmurs, no bruits Lungs: CTAB, no accessory muscle use Neuro: CN 2-12 intact, no cerebellar signs, DTR's equal and symmetry, no clonus MSK: 5/5 strength throughout, normal gait, +TTP over posterior cervical triangle and paraspinal cervical musculature Psych: Age appropriate judgment and insight, mood and affect normal  PROCEDURE NOTE After discussing the procedure and risks, including but not limited to increased pain or stiffness and rarely nausea or dizziness, verbal consent was obtained.  Pre-procedure diagnosis: Somatic dysfunction Post-procedure diagnosis: Same Procedure: OMT  Regions treated include suboccipital region: Soft tissue with the tissue response noted to be Improved.  The patient tolerated the procedure well, and there were no complications noted.  The patient was warned of the possibility of increased pain or stiffness of up to 48 hours duration and was asked to call with any unexpected problems.   Tension-type headache, not intractable, unspecified chronicity pattern - Plan: predniSONE (STERAPRED UNI-PAK 21 TAB)  10 MG (21) TBPK tablet, Ambulatory referral to Neurology, naproxen (NAPROSYN) 500 MG tablet  Neck pain - Plan: DG Cervical Spine 2 or 3 views  Orders as above. Taught pt subocc release to do at home/have wife help with. He did report some improvement. Home stretches and exercises given for the neck. Heat. Sparing use of anti-inflammatories, 2 times per week or less. Do not use it more than once in 1 day. Call sleep specialist regarding his CPAP machine. We will try to get back in neurology, since he has seen Dr. Tomi Likens within last 3 years, I suggested he try to call their office. XR to r/o stenosis. Follow up in 4  weeks. The patient voiced understanding and agreement to the plan.  Greater than 21 minutes were spent face to face with the patient with greater than 50% of this time spent counseling on HA treatment, prognosis and follow up.   [place], [place] 4:33 PM 10/15/16

## 2016-10-15 NOTE — Progress Notes (Signed)
Pre visit review using our clinic review tool, if applicable. No additional management support is needed unless otherwise documented below in the visit note. 

## 2016-10-22 ENCOUNTER — Ambulatory Visit (HOSPITAL_BASED_OUTPATIENT_CLINIC_OR_DEPARTMENT_OTHER)
Admission: RE | Admit: 2016-10-22 | Discharge: 2016-10-22 | Disposition: A | Discharge status: Home / Self Care | Payer: BLUE CROSS/BLUE SHIELD | Source: Ambulatory Visit | Attending: Family Medicine | Admitting: Family Medicine

## 2016-10-22 DIAGNOSIS — M5030 Other cervical disc degeneration, unspecified cervical region: Secondary | ICD-10-CM | POA: Insufficient documentation

## 2016-10-22 DIAGNOSIS — M542 Cervicalgia: Secondary | ICD-10-CM | POA: Diagnosis present

## 2016-10-24 ENCOUNTER — Encounter: Payer: Self-pay | Admitting: Family Medicine

## 2016-10-24 ENCOUNTER — Ambulatory Visit (INDEPENDENT_AMBULATORY_CARE_PROVIDER_SITE_OTHER): Payer: BLUE CROSS/BLUE SHIELD | Admitting: Family Medicine

## 2016-10-24 ENCOUNTER — Ambulatory Visit: Payer: Self-pay | Admitting: Family Medicine

## 2016-10-24 VITALS — BP 122/88 | HR 95 | Temp 97.6°F | Ht 76.0 in | Wt 263.5 lb

## 2016-10-24 DIAGNOSIS — F419 Anxiety disorder, unspecified: Secondary | ICD-10-CM | POA: Diagnosis not present

## 2016-10-24 DIAGNOSIS — R519 Headache, unspecified: Secondary | ICD-10-CM

## 2016-10-24 DIAGNOSIS — R51 Headache: Secondary | ICD-10-CM | POA: Diagnosis not present

## 2016-10-24 DIAGNOSIS — F329 Major depressive disorder, single episode, unspecified: Secondary | ICD-10-CM | POA: Diagnosis not present

## 2016-10-24 DIAGNOSIS — F32A Depression, unspecified: Secondary | ICD-10-CM

## 2016-10-24 MED ORDER — SUMATRIPTAN SUCCINATE 50 MG PO TABS: 50.0000 mg | ORAL_TABLET | ORAL | 0 refills | Status: DC | PRN

## 2016-10-24 MED ORDER — FLUOXETINE HCL 20 MG PO TABS: 20.0000 mg | ORAL_TABLET | Freq: Every day | ORAL | 3 refills | Status: DC

## 2016-10-24 MED ORDER — CLONAZEPAM 1 MG PO TABS: 1.0000 mg | ORAL_TABLET | Freq: Two times a day (BID) | ORAL | 2 refills | Status: DC | PRN

## 2016-10-24 MED ORDER — BUSPIRONE HCL 7.5 MG PO TABS: 7.5000 mg | ORAL_TABLET | Freq: Three times a day (TID) | ORAL | 2 refills | Status: DC

## 2016-10-24 NOTE — Patient Instructions (Addendum)
Please consider counseling. The medical literature and evidence-based guidelines support it. Contact [phone] to schedule an appointment or inquire about cost/insurance coverage.  Use the CPAP!  Don't miss your appts with Neurology and Psych.   Let us know if you need anything.

## 2016-10-24 NOTE — Progress Notes (Signed)
Pre visit review using our clinic review tool, if applicable. No additional management support is needed unless otherwise documented below in the visit note. 

## 2016-10-24 NOTE — Progress Notes (Signed)
Chief Complaint  Patient presents with  . Anxiety  . Headache    Subjective Kurt Mcdonald presents for f/u anxiety/depression.  He is currently being treated with Lexapro and Wellbutrin.  Reports poor improvement since treatemnt. Has failed Effexor/Cymbalta. Reports getting routine exercise.  No thoughts of harming self or others. No self-medication with alcohol, prescription drugs or illicit drugs. He is not following with a counselor/psychologist. He has an appointment with psychiatry in 6 weeks.  HA continues, Neuro appt on 11/6.  They are located bilaterally over his forehead and temples.  Stress makes it worse.  He was placed on a prednisone taper that helped for 3 days.  He is also doing stretches and exercises for his neck.  He is also been using anti-inflammatories.  He has untreated sleep apnea.  I did tell him to contact his sleep specialist, but he contacted the medical supplier and plans to have a CPAP machine by this Friday.  ROS Psych: No homicidal or suicidal thoughts Neuro: +HA's  Past Medical History:  Diagnosis Date  . Anxiety   . Chicken pox   . Chronic pain syndrome   . Depression   . Diverticulitis of colon without hemorrhage 06/04/2013  . Diverticulosis of colon   . Headache   . History of pneumonia   . Hypercholesteremia   . Irritable bowel syndrome (IBS)   . LBP (low back pain)   . Male hypogonadism   . NAFLD (nonalcoholic fatty liver disease)   . Overweight(278.02)   . Vision abnormalities    Family History  Problem Relation Age of Onset  . Healthy Mother        Living  . Pneumonia Father 53       Deceased  . Bone cancer Paternal Grandfather   . Diabetes Maternal Grandfather   . Healthy Brother   . Healthy Son        x1  . Healthy Daughter        x2   Allergies as of 10/24/2016      Reactions   Butrans [buprenorphine]    Felt funny   Crestor [rosuvastatin Calcium]    Severe leg cramping   Sulfa Antibiotics    rash       Medication List       Accurate as of 10/24/16  9:44 AM. Always use your most recent med list.          albuterol 108 (90 Base) MCG/ACT inhaler Commonly known as:  PROVENTIL HFA;VENTOLIN HFA Inhale 2 puffs into the lungs every 4 (four) hours as needed for wheezing or shortness of breath.   clonazePAM 1 MG tablet Commonly known as:  KLONOPIN Take 1 tablet (1 mg total) by mouth 2 (two) times daily as needed.   FLUoxetine 20 MG tablet Commonly known as:  PROZAC Take 1 tablet (20 mg total) by mouth daily.   HYDROcodone-acetaminophen 10-325 MG tablet Commonly known as:  NORCO   METAMUCIL FIBER PO Take 1 Dose by mouth 3 (three) times daily.   SUMAtriptan 50 MG tablet Commonly known as:  IMITREX Take 1 tablet (50 mg total) by mouth every 2 (two) hours as needed for migraine. May repeat in 2 hours if headache persists or recurs.       Exam BP 122/88 (BP Location: Left Arm, Patient Position: Sitting, Cuff Size: Large)   Pulse 95   Temp 97.6 F (36.4 C) (Oral)   Ht 6\' 4"  (1.93 m)   Wt 263 lb 8 oz (  119.5 kg)   SpO2 96%   BMI 32.07 kg/m  General:  well developed, well nourished, in no apparent distress Lungs:  no respiratory distress Psych: well oriented with normal range of affect and age-appropriate judgement/insight, alert and oriented x4.  Assessment and Plan  Nonintractable headache, unspecified chronicity pattern, unspecified headache type - Plan: SUMAtriptan (IMITREX) 50 MG tablet  Anxiety and depression - Plan: clonazePAM (KLONOPIN) 1 MG tablet, FLUoxetine (PROZAC) 20 MG tablet  Both issues are uncontrolled. Orders as above. Trial triptan. Stop Naproxen. Counseled on medication overuse headaches, which he is aware of. I am pleased to hear that he has plans to get a CPAP machine this weekend.  I am also glad he has an appointment with Dr. Tomi Likens coming up. He specifically asked about trying Prozac which I am happy to try him on.  He stated he did better with name  brand Klonopin, I reprinted a prescription with this request on it.  Stop Wellbutrin, start BuSpar. F/u as needed since he has specialty evaluation for these issues coming up. The patient voiced understanding and agreement to the plan.  [place], DO 10/24/16 9:44 AM

## 2016-10-26 ENCOUNTER — Ambulatory Visit: Payer: Self-pay | Admitting: Family Medicine

## 2016-11-06 ENCOUNTER — Encounter: Payer: Self-pay | Admitting: Neurology

## 2016-11-06 ENCOUNTER — Encounter: Payer: Self-pay | Admitting: Family Medicine

## 2016-11-06 ENCOUNTER — Ambulatory Visit: Payer: BLUE CROSS/BLUE SHIELD | Admitting: Neurology

## 2016-11-06 VITALS — BP 128/90 | HR 118 | Ht 76.0 in | Wt 268.6 lb

## 2016-11-06 DIAGNOSIS — G629 Polyneuropathy, unspecified: Secondary | ICD-10-CM | POA: Diagnosis not present

## 2016-11-06 DIAGNOSIS — G44201 Tension-type headache, unspecified, intractable: Secondary | ICD-10-CM | POA: Diagnosis not present

## 2016-11-06 DIAGNOSIS — R202 Paresthesia of skin: Secondary | ICD-10-CM | POA: Diagnosis not present

## 2016-11-06 DIAGNOSIS — R200 Anesthesia of skin: Secondary | ICD-10-CM

## 2016-11-06 MED ORDER — NORTRIPTYLINE HCL 25 MG PO CAPS: 25.0000 mg | ORAL_CAPSULE | Freq: Every day | ORAL | 3 refills | Status: DC

## 2016-11-06 NOTE — Addendum Note (Signed)
Addended by: Clois Comber on: 11/06/2016 03:35 PM   Modules accepted: Orders

## 2016-11-06 NOTE — Patient Instructions (Signed)
1.  Restart nortriptyline 25mg  at bedtime.  If doing well in 4 weeks, refill it.  If not any better, contact me and I will increase dose. 2.  Limit use of pain relievers to no more than 2 days out of the week.  Do not use hydrocodone if possible.  Use Aleve or Advil with caffeine 3.  Follow up in 3 months.

## 2016-11-06 NOTE — Progress Notes (Signed)
NEUROLOGY FOLLOW UP OFFICE NOTE  Kurt Mcdonald [id number]  HISTORY OF PRESENT ILLNESS: Kurt Mcdonald is a 51 year old right-handed male with male hypogonadism, chronic low back pain, IBS, OSA, anxiety, depression, headache and hypercholesterolemia who follows up for chronic tension type headaches.  UPDATE: I have not seen Kurt Mcdonald since September 2016.   He was on nortriptyline for a time and he discontinued it when headaches improved.  Beginning 2 months ago, headaches have increased to daily and intractable. Intensity:  severe Duration:  constant Frequency:  constant Current abortive medication:  Hydrocodone (2 or 3 days a week) Antihypertensive medications:  no Antidepressant medications:  Prozac Anticonvulsant medications:  no Vitamins/Herbal/Supplements:  no Other therapy:  no Other medication:  BuSpar  For persistent neck and thoracic pain, MRI of cervical, thoracic and lumbar spine from November 2016 showed mild multilevel degenerative disc and facet disease but no stenosis.  Caffeine:  Not regularly Alcohol:  no Smoker:  no Diet:  Good.  Keeps hydrated. Exercise:  no Depression/stress:  Depression and anxiety Sleep hygiene:  Improved.  Uses CPAP  HISTORY: Onset:  4 months ago Location:  Bi-frontal or retro-orbital Quality:  Non throbbing, squeezing Initial intensity:  6/10 (may be worse at times) Aura:  no Prodrome:  no Associated symptoms:  Some photophobia or phonophobia.  No nausea or visual symptoms Initial Duration:  constant Initial Frequency:  constant Triggers/exacerbating factors:  stress Relieving factors:  nothing Activity:  2-3 days per week, needs to lay in bed  Also, for the past month or so, he notes numbness and tingling in his feet.  In May, B12 was in the 590s and TSH was 1.66.  Past abortive medication:  sumatriptan 50mg  Past antihypertensive: propranolol 40mg  twice daily Past antidepressant:  amitriptyline 10mg  (stopped for unknown  reason), nortriptyline 50mg , Cymbalta, Effexor   Past antiepileptic:  topiramate 50mg  twice daily (paresthesias)  MRI of brain without contrast from 08/25/14 was negative.  He also has chronic pain syndrome, with back pain and right sided sciatica.  He was previously treated by Dr. Arlice Colt.  He takes Norco and etodolac.  Family history of headache:  no  PAST MEDICAL HISTORY: Past Medical History:  Diagnosis Date  . Anxiety   . Chicken pox   . Chronic pain syndrome   . Depression   . Diverticulitis of colon without hemorrhage 06/04/2013  . Diverticulosis of colon   . Headache   . History of pneumonia   . Hypercholesteremia   . Irritable bowel syndrome (IBS)   . LBP (low back pain)   . Male hypogonadism   . NAFLD (nonalcoholic fatty liver disease)   . Overweight(278.02)   . Vision abnormalities     MEDICATIONS: Current Outpatient Medications on File Prior to Visit  Medication Sig Dispense Refill  . albuterol (PROVENTIL HFA;VENTOLIN HFA) 108 (90 Base) MCG/ACT inhaler Inhale 2 puffs into the lungs every 4 (four) hours as needed for wheezing or shortness of breath. 3 Inhaler 1  . busPIRone (BUSPAR) 7.5 MG tablet Take 1 tablet (7.5 mg total) by mouth 3 (three) times daily. 90 tablet 2  . clonazePAM (KLONOPIN) 1 MG tablet Take 1 tablet (1 mg total) by mouth 2 (two) times daily as needed. 60 tablet 2  . FLUoxetine (PROZAC) 20 MG tablet Take 1 tablet (20 mg total) by mouth daily. 30 tablet 3  . HYDROcodone-acetaminophen (NORCO) 10-325 MG tablet   0  . Psyllium (METAMUCIL FIBER PO) Take 1 Dose by mouth  3 (three) times daily.    . SUMAtriptan (IMITREX) 50 MG tablet Take 1 tablet (50 mg total) by mouth every 2 (two) hours as needed for migraine. May repeat in 2 hours if headache persists or recurs. 10 tablet 0   No current facility-administered medications on file prior to visit.     ALLERGIES: Allergies  Allergen Reactions  . Butrans [Buprenorphine]     Felt funny  . Crestor  [Rosuvastatin Calcium]     Severe leg cramping  . Sulfa Antibiotics     rash    FAMILY HISTORY: Family History  Problem Relation Age of Onset  . Healthy Mother        Living  . Pneumonia Father 73       Deceased  . Bone cancer Paternal Grandfather   . Diabetes Maternal Grandfather   . Healthy Brother   . Healthy Son        x1  . Healthy Daughter        x2    SOCIAL HISTORY: Social History   Socioeconomic History  . Marital status: Married    Spouse name: Malachy Mood  . Number of children: 3  . Years of education: Not on file  . Highest education level: Not on file  Social Needs  . Financial resource strain: Not on file  . Food insecurity - worry: Not on file  . Food insecurity - inability: Not on file  . Transportation needs - medical: Not on file  . Transportation needs - non-medical: Not on file  Occupational History  . Occupation: English as a second language teacher: [employer]  Tobacco Use  . Smoking status: Never Smoker  . Smokeless tobacco: Never Used  Substance and Sexual Activity  . Alcohol use: Yes    Alcohol/week: 0.0 oz    Comment: Rare  . Drug use: No  . Sexual activity: Not on file  Other Topics Concern  . Not on file  Social History Narrative  . Not on file    REVIEW OF SYSTEMS: Constitutional: No fevers, chills, or sweats, no generalized fatigue, change in appetite Eyes: No visual changes, double vision, eye pain Ear, nose and throat: No hearing loss, ear pain, nasal congestion, sore throat Cardiovascular: No chest pain, palpitations Respiratory:  No shortness of breath at rest or with exertion, wheezes GastrointestinaI: No nausea, vomiting, diarrhea, abdominal pain, fecal incontinence Genitourinary:  No dysuria, urinary retention or frequency Musculoskeletal:  No neck pain, back pain Integumentary: No rash, pruritus, skin lesions Neurological: as above Psychiatric: No depression, insomnia, anxiety Endocrine: No palpitations, fatigue, diaphoresis, mood  swings, change in appetite, change in weight, increased thirst Hematologic/Lymphatic:  No purpura, petechiae. Allergic/Immunologic: no itchy/runny eyes, nasal congestion, recent allergic reactions, rashes  PHYSICAL EXAM: Vitals:   11/06/16 1317  BP: 128/90  Pulse: (!) 118  SpO2: 97%   General: No acute distress.  Patient appears well-groomed.   Head:  Normocephalic/atraumatic Eyes:  Fundi examined but not visualized Neck: supple, no paraspinal tenderness, full range of motion Heart:  Regular rate and rhythm Lungs:  Clear to auscultation bilaterally Back: No paraspinal tenderness Neurological Exam: alert and oriented to person, place, and time. Attention span and concentration intact, recent and remote memory intact, fund of knowledge intact.  Speech fluent and not dysarthric, language intact.  CN II-XII intact. Bulk and tone normal, muscle strength 5/5 throughout.  Sensation to pinprick, temperature and vibration reduced in feet.  Deep tendon reflexes 2+ throughout, toes downgoing.  Finger to nose and  heel to shin testing intact.  Gait normal, Romberg negative.  IMPRESSION: Intractable tension-type headache Bilateral numbness in feet.  Ongoing for a year.  Possible peripheral neuropathy   PLAN: 1.  Restart nortriptyline 25mg  at bedtime.  If doing well in 4 weeks, refill it.  If not any better, contact me and I will increase dose. 2.  Limit use of pain relievers to no more than 2 days out of the week.  Do not use hydrocodone if possible.  Use Aleve or Advil with caffeine 3.  Neuropathy workup:  NCV-EMG lower extremities and serum ANA, Sed Rate, B6, B12 and TSH. 4.  Follow up in 3 months.  Metta Clines, DO  CC:  Riki Sheer, DO

## 2016-11-07 MED ORDER — DULOXETINE HCL 60 MG PO CPEP: 60.0000 mg | ORAL_CAPSULE | Freq: Every day | ORAL | 3 refills | Status: DC

## 2016-11-07 NOTE — Telephone Encounter (Signed)
Prozac DC'd, Cymbalta reordered. Pt notified via [place].

## 2016-11-13 ENCOUNTER — Encounter: Payer: Self-pay | Admitting: Neurology

## 2017-01-21 ENCOUNTER — Telehealth: Payer: Self-pay | Admitting: Family Medicine

## 2017-01-21 NOTE — Telephone Encounter (Signed)
Copied from [place]. Topic: Quick Communication - See Telephone Encounter >> Jan 21, 2017  2:23 PM Vernona Rieger wrote: CRM for notification. See Telephone encounter for:   01/21/17.  DULoxetine (CYMBALTA) 60 MG capsule  Patient wants this to be changed from [place] to [facility] on [address].

## 2017-01-22 MED ORDER — DULOXETINE HCL 60 MG PO CPEP: 60.0000 mg | ORAL_CAPSULE | Freq: Every day | ORAL | 3 refills | Status: DC

## 2017-01-22 NOTE — Telephone Encounter (Signed)
Prescription sent in  

## 2017-02-20 ENCOUNTER — Ambulatory Visit: Payer: Self-pay | Admitting: Neurology

## 2017-03-05 ENCOUNTER — Encounter: Payer: Self-pay | Admitting: Family Medicine

## 2017-03-05 ENCOUNTER — Telehealth: Payer: Self-pay | Admitting: Family Medicine

## 2017-03-05 DIAGNOSIS — F419 Anxiety disorder, unspecified: Principal | ICD-10-CM

## 2017-03-05 DIAGNOSIS — F329 Major depressive disorder, single episode, unspecified: Secondary | ICD-10-CM

## 2017-03-05 DIAGNOSIS — F32A Depression, unspecified: Secondary | ICD-10-CM

## 2017-03-05 NOTE — Telephone Encounter (Unsigned)
Copied from [place] [phone]. Topic: Quick Communication - Rx Refill/Question >> Mar 05, 2017  3:19 PM Carolyn Stare wrote: Medication  clonazePAM (KLONOPIN) 1 MG tablet   Has the patient contacted their pharmacy yes    Preferred Pharmacy  [facility] [address]    Agent: Please be advised that RX refills may take up to 3 business days. We ask that you follow-up with your pharmacy.

## 2017-03-06 MED ORDER — CLONAZEPAM 1 MG PO TABS: 1.0000 mg | ORAL_TABLET | Freq: Two times a day (BID) | ORAL | 2 refills | Status: DC | PRN

## 2017-05-09 ENCOUNTER — Encounter: Payer: Self-pay | Admitting: Gastroenterology

## 2017-05-10 ENCOUNTER — Encounter: Payer: Self-pay | Admitting: Gastroenterology

## 2017-07-05 ENCOUNTER — Telehealth: Payer: Self-pay

## 2017-07-05 NOTE — Telephone Encounter (Signed)
Copied from [place] [phone]. Topic: Quick Communication - Appointment Cancellation >> Jul 05, 2017  3:45 PM Rutherford Nail, NT wrote: Patient called to cancel appointment scheduled for 07/08/17. Patient has not rescheduled their appointment.  Route to department's PEC pool.

## 2017-07-08 ENCOUNTER — Encounter: Payer: BLUE CROSS/BLUE SHIELD | Admitting: Family Medicine

## 2017-07-17 ENCOUNTER — Encounter: Payer: Self-pay | Admitting: Gastroenterology

## 2017-08-05 ENCOUNTER — Telehealth: Payer: Self-pay | Admitting: *Deleted

## 2017-08-05 NOTE — Telephone Encounter (Signed)
Received request for Medical Records from [facility]; forwarded to [place] for email/scana/SLS 08/02

## 2017-12-16 ENCOUNTER — Encounter: Payer: Self-pay | Admitting: Family Medicine

## 2017-12-16 DIAGNOSIS — F329 Major depressive disorder, single episode, unspecified: Secondary | ICD-10-CM

## 2017-12-16 DIAGNOSIS — F419 Anxiety disorder, unspecified: Principal | ICD-10-CM

## 2017-12-16 DIAGNOSIS — F32A Depression, unspecified: Secondary | ICD-10-CM

## 2017-12-16 MED ORDER — CLONAZEPAM 1 MG PO TABS: 1.0000 mg | ORAL_TABLET | Freq: Two times a day (BID) | ORAL | 2 refills | Status: DC | PRN

## 2018-01-28 ENCOUNTER — Encounter: Payer: Self-pay | Admitting: Family Medicine

## 2019-01-22 IMAGING — DX DG ABDOMEN 2V
3 series · 3 of 3 positions shown · non-contrast
Comparison: CT abdomen and pelvis April 01, 2013

CLINICAL DATA: One year history of lower abdominal pain

EXAM:
ABDOMEN - 2 VIEW

[abdomen erect]
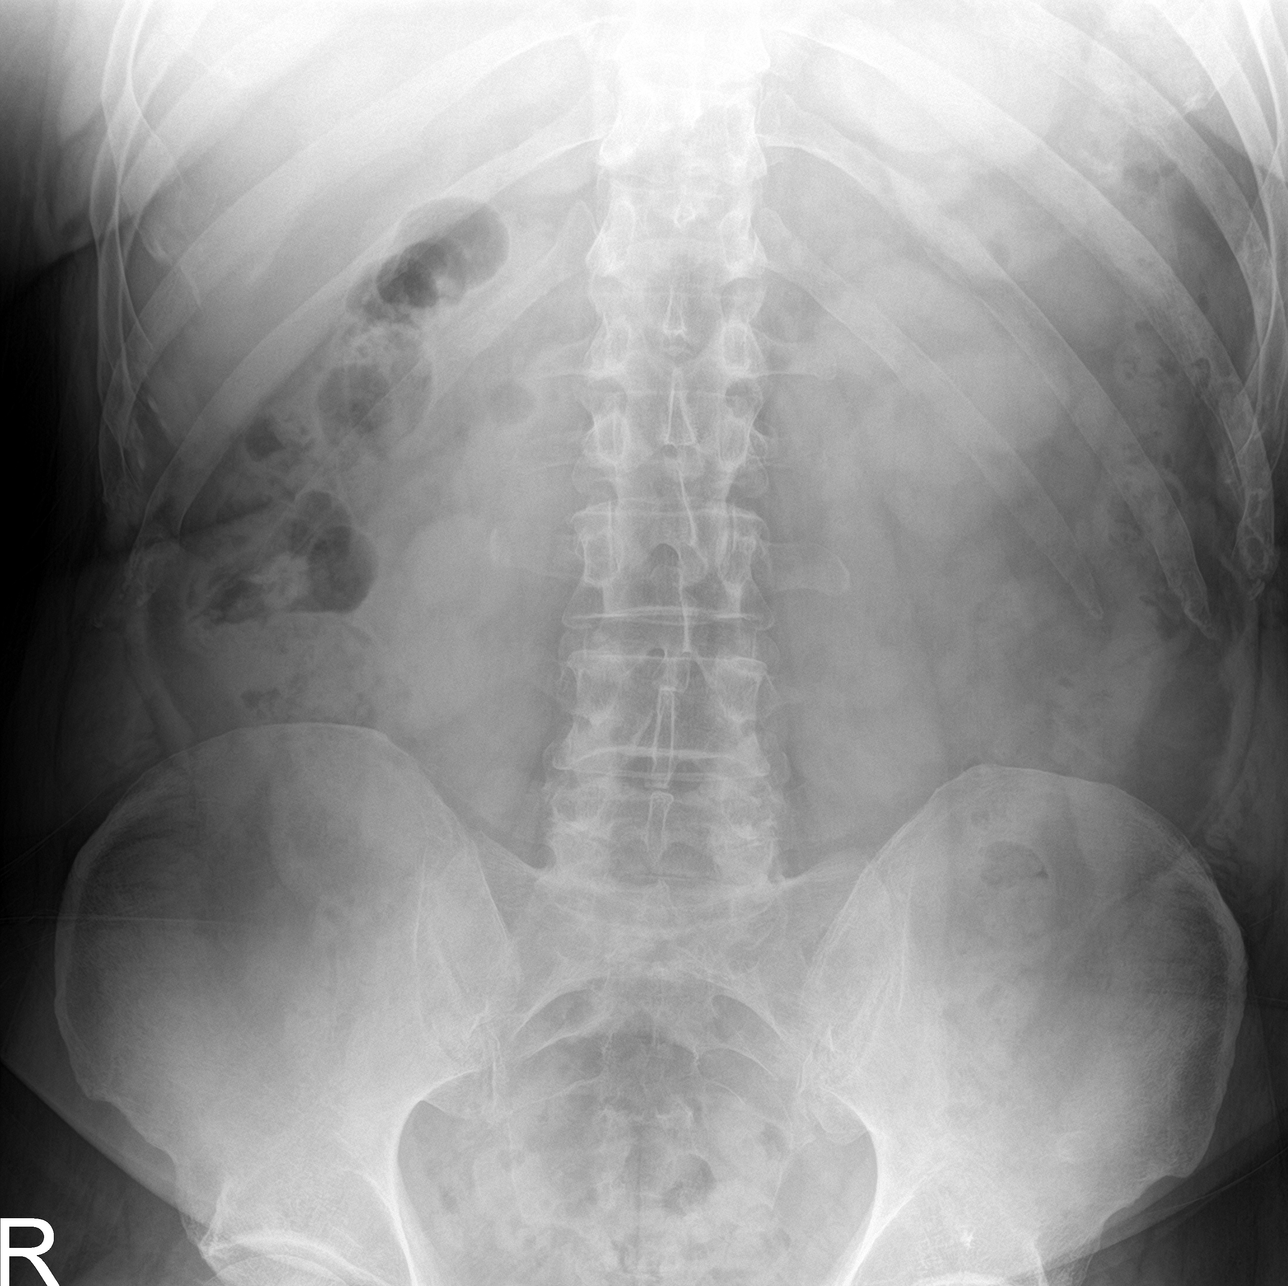

[abdomen supine (1 of 2)]
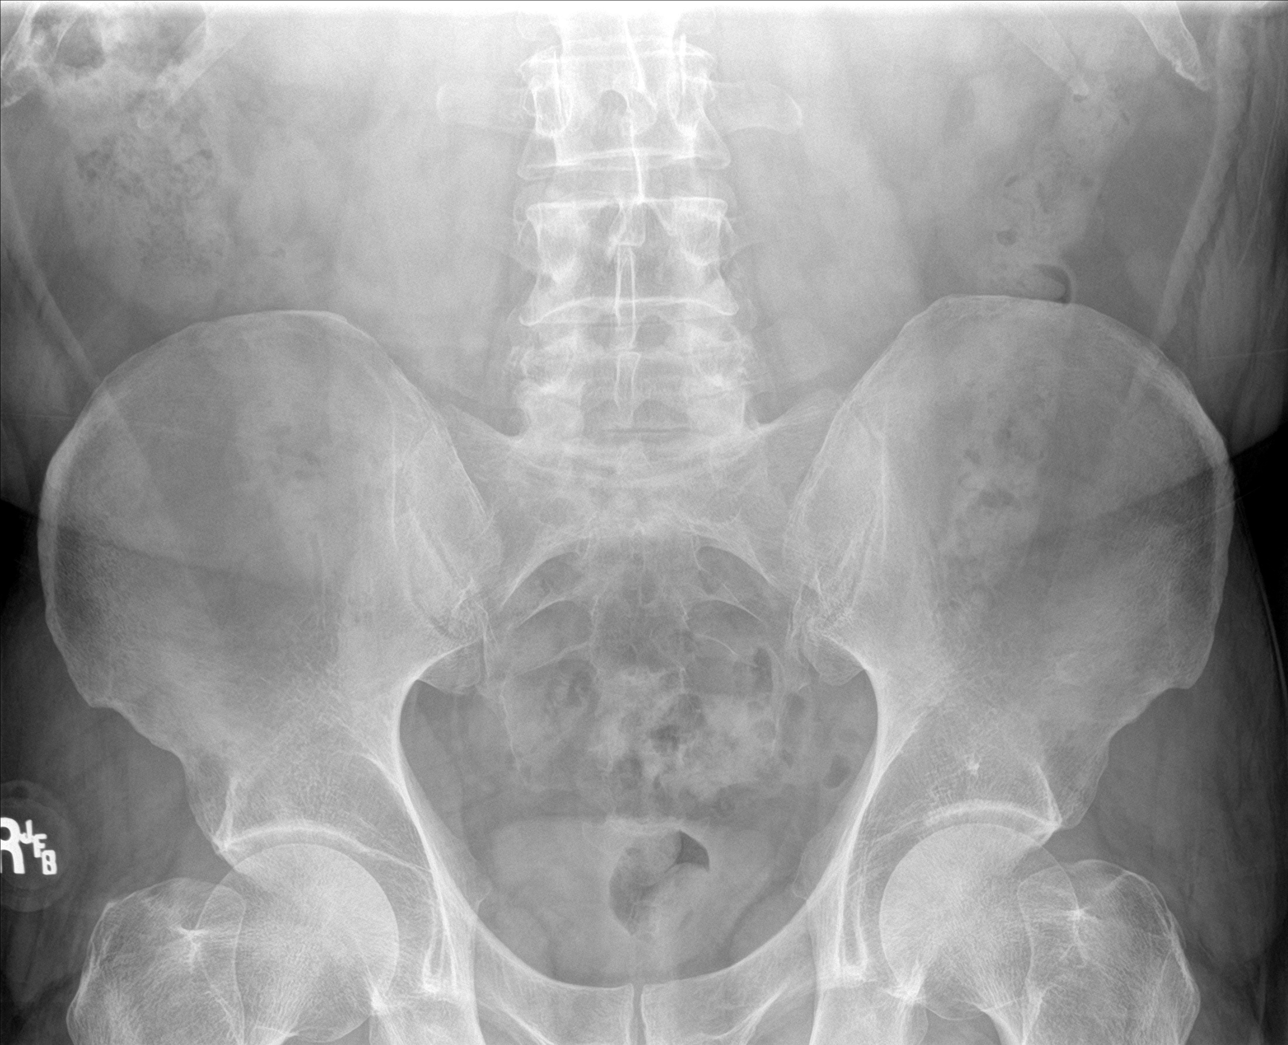

[abdomen supine (2 of 2)]
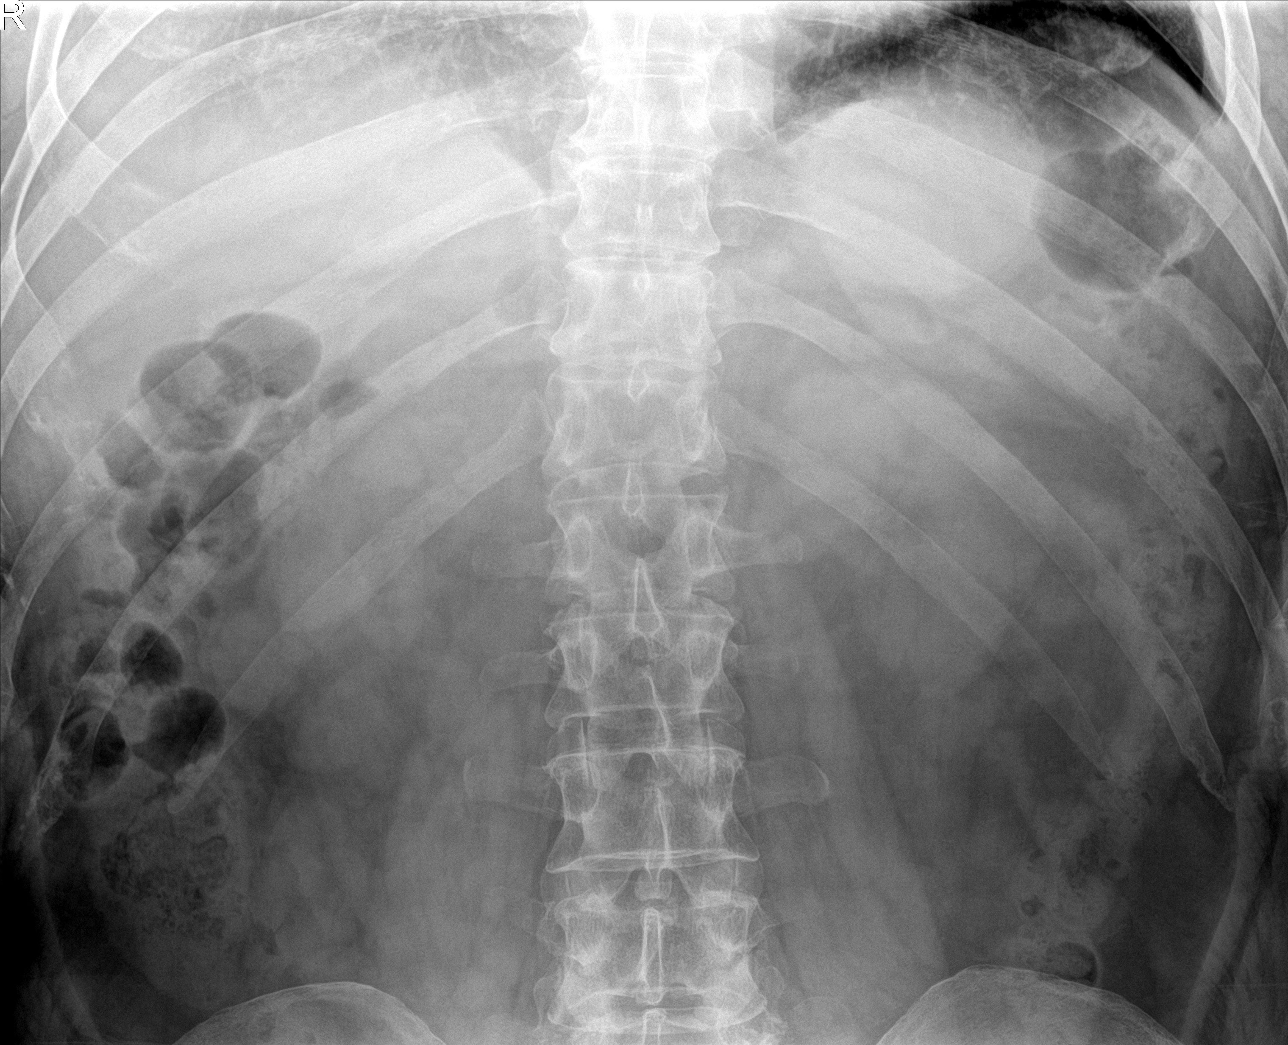

[3 of 3 positions shown; findings below may reference images not displayed]

FINDINGS: Supine and upright images obtained. There is moderate stool
throughout colon. There is no bowel dilatation or air-fluid levels
suggesting bowel obstruction. No free air. No abnormal
calcifications are evident.
IMPRESSION: Moderate stool throughout colon.  No bowel obstruction or free air.

## 2019-09-28 ENCOUNTER — Emergency Department (HOSPITAL_BASED_OUTPATIENT_CLINIC_OR_DEPARTMENT_OTHER): Payer: BLUE CROSS/BLUE SHIELD

## 2019-09-28 ENCOUNTER — Encounter (HOSPITAL_BASED_OUTPATIENT_CLINIC_OR_DEPARTMENT_OTHER): Payer: Self-pay | Admitting: *Deleted

## 2019-09-28 ENCOUNTER — Other Ambulatory Visit: Payer: Self-pay

## 2019-09-28 ENCOUNTER — Emergency Department (HOSPITAL_BASED_OUTPATIENT_CLINIC_OR_DEPARTMENT_OTHER)
Admission: EM | Admit: 2019-09-28 | Discharge: 2019-09-28 | Disposition: A | Discharge status: Home / Self Care | Payer: BLUE CROSS/BLUE SHIELD | Attending: Emergency Medicine | Admitting: Emergency Medicine

## 2019-09-28 DIAGNOSIS — K5792 Diverticulitis of intestine, part unspecified, without perforation or abscess without bleeding: Secondary | ICD-10-CM

## 2019-09-28 DIAGNOSIS — K5732 Diverticulitis of large intestine without perforation or abscess without bleeding: Secondary | ICD-10-CM | POA: Diagnosis not present

## 2019-09-28 DIAGNOSIS — R1032 Left lower quadrant pain: Secondary | ICD-10-CM | POA: Diagnosis present

## 2019-09-28 LAB — URINALYSIS, ROUTINE W REFLEX MICROSCOPIC
Bilirubin Urine: NEGATIVE
Glucose, UA: NEGATIVE mg/dL
Hgb urine dipstick: NEGATIVE
Ketones, ur: NEGATIVE mg/dL
Leukocytes,Ua: NEGATIVE
Nitrite: NEGATIVE
Protein, ur: NEGATIVE mg/dL
Specific Gravity, Urine: 1.02 (ref 1.005–1.030)
pH: 6.5 (ref 5.0–8.0)

## 2019-09-28 LAB — CBC WITH DIFFERENTIAL/PLATELET
Abs Immature Granulocytes: 0.08 10*3/uL — ABNORMAL HIGH (ref 0.00–0.07)
Basophils Absolute: 0.1 10*3/uL (ref 0.0–0.1)
Basophils Relative: 0 %
Eosinophils Absolute: 0.2 10*3/uL (ref 0.0–0.5)
Eosinophils Relative: 1 %
HCT: 45.2 % (ref 39.0–52.0)
Hemoglobin: 15.5 g/dL (ref 13.0–17.0)
Immature Granulocytes: 1 %
Lymphocytes Relative: 13 %
Lymphs Abs: 2 10*3/uL (ref 0.7–4.0)
MCH: 30.5 pg (ref 26.0–34.0)
MCHC: 34.3 g/dL (ref 30.0–36.0)
MCV: 88.8 fL (ref 80.0–100.0)
Monocytes Absolute: 1.3 10*3/uL — ABNORMAL HIGH (ref 0.1–1.0)
Monocytes Relative: 8 %
Neutro Abs: 11.5 10*3/uL — ABNORMAL HIGH (ref 1.7–7.7)
Neutrophils Relative %: 77 %
Platelets: 453 10*3/uL — ABNORMAL HIGH (ref 150–400)
RBC: 5.09 MIL/uL (ref 4.22–5.81)
RDW: 13.7 % (ref 11.5–15.5)
WBC: 15 10*3/uL — ABNORMAL HIGH (ref 4.0–10.5)
nRBC: 0 % (ref 0.0–0.2)

## 2019-09-28 LAB — COMPREHENSIVE METABOLIC PANEL
ALT: 49 U/L — ABNORMAL HIGH (ref 0–44)
AST: 31 U/L (ref 15–41)
Albumin: 4 g/dL (ref 3.5–5.0)
Alkaline Phosphatase: 60 U/L (ref 38–126)
Anion gap: 12 (ref 5–15)
BUN: 13 mg/dL (ref 6–20)
CO2: 21 mmol/L — ABNORMAL LOW (ref 22–32)
Calcium: 9.2 mg/dL (ref 8.9–10.3)
Chloride: 101 mmol/L (ref 98–111)
Creatinine, Ser: 1.18 mg/dL (ref 0.61–1.24)
GFR calc Af Amer: >60 mL/min (ref >60)
GFR calc non Af Amer: >60 mL/min (ref >60)
Glucose, Bld: 125 mg/dL — ABNORMAL HIGH (ref 70–99)
Potassium: 4 mmol/L (ref 3.5–5.1)
Sodium: 134 mmol/L — ABNORMAL LOW (ref 135–145)
Total Bilirubin: 1.1 mg/dL (ref 0.3–1.2)
Total Protein: 7.4 g/dL (ref 6.5–8.1)

## 2019-09-28 MED ORDER — AMOXICILLIN-POT CLAVULANATE 875-125 MG PO TABS: 1.0000 | ORAL_TABLET | Freq: Two times a day (BID) | ORAL | 0 refills | Status: AC

## 2019-09-28 MED ORDER — HYDROCODONE-ACETAMINOPHEN 5-325 MG PO TABS: 2.0000 | ORAL_TABLET | Freq: Four times a day (QID) | ORAL | 0 refills | Status: DC | PRN

## 2019-09-28 MED ORDER — MORPHINE SULFATE (PF) 4 MG/ML IV SOLN
4.0000 mg | Freq: Once | INTRAVENOUS | Status: AC
  Administered 2019-09-28: 4 mg via INTRAVENOUS
  Filled 2019-09-28: qty 1

## 2019-09-28 MED ORDER — SODIUM CHLORIDE 0.9 % IV BOLUS
1000.0000 mL | Freq: Once | INTRAVENOUS | Status: AC
  Administered 2019-09-28: 1000 mL via INTRAVENOUS

## 2019-09-28 MED ORDER — IOHEXOL 300 MG/ML  SOLN
100.0000 mL | Freq: Once | INTRAMUSCULAR | Status: AC | PRN
  Administered 2019-09-28: 100 mL via INTRAVENOUS

## 2019-09-28 MED ORDER — AMOXICILLIN-POT CLAVULANATE 875-125 MG PO TABS
1.0000 | ORAL_TABLET | Freq: Once | ORAL | Status: AC
  Administered 2019-09-28: 1 via ORAL
  Filled 2019-09-28: qty 1

## 2019-09-28 NOTE — ED Provider Notes (Signed)
[facility] EMERGENCY DEPARTMENT Provider Note   CSN: [id number] Arrival date & time: 09/28/19  1613     History Chief Complaint  Patient presents with  . Abdominal Pain    Kurt Mcdonald is a 54 y.o. male presenting for evaluation of LLQ abd pain.   Pt states he has had pain in the LLQ abd for 5 days. Pain is constant.  It does not radiate.  Yesterday he had associated nausea, no vomiting.  He denies fevers, chills, chest pain, shortness breath, cough.  He reports decreased urination, but states he has not been drinking as much.  No change in pain with urination.  He reports mild constipation, he is still having bowel movements, they do small and hard.  He does report mild improvement of pain after having a bowel movement.  He reports a history of diverticulitis many years ago, thinks this may feel similar.  He follows with [facility].  He reports no other medical problems. No sick contacts  HPI     Past Medical History:  Diagnosis Date  . Anxiety   . Chicken pox   . Chronic pain syndrome   . Depression   . Diverticulitis of colon without hemorrhage 06/04/2013  . Diverticulosis of colon   . Headache   . History of pneumonia   . Hypercholesteremia   . Irritable bowel syndrome (IBS)   . LBP (low back pain)   . Male hypogonadism   . NAFLD (nonalcoholic fatty liver disease)   . Overweight(278.02)   . Vision abnormalities     Patient Active Problem List   Diagnosis Date Noted  . OSA (obstructive sleep apnea) 05/02/2015  . Asthma 05/02/2015  . Obesity 05/02/2015  . Exertional dyspnea 05/02/2015  . Oral thrush 03/02/2015  . Multilevel degenerative disc disease 11/09/2014  . Chronic tension-type headache, intractable 09/14/2014  . Chronic migraine 08/20/2014  . Chronic fatigue 05/23/2014  . Witnessed apneic spells 05/23/2014  . Right-sided low back pain with right-sided sciatica 03/31/2014  . Mid back pain 03/31/2014  . Loss of libido 06/24/2013  . Abdominal  pain 07/14/2012  . Abdominal pain, other specified site 07/01/2012  . Dyspnea 06/16/2010  . Hypercholesteremia 06/16/2010  . Overweight(278.02) 06/16/2010  . Diverticulosis 06/16/2010  . IBS (irritable bowel syndrome) 06/16/2010  . NAFLD (nonalcoholic fatty liver disease) 06/16/2010  . Hypogonadism male 06/16/2010  . Anxiety and depression 04/11/2010    Past Surgical History:  Procedure Laterality Date  . shoulder sugery  01/2010   right  . TOOTH EXTRACTION  11/2011  . WISDOM TOOTH EXTRACTION         Family History  Problem Relation Age of Onset  . Healthy Mother        Living  . Pneumonia Father 77       Deceased  . Bone cancer Paternal Grandfather   . Diabetes Maternal Grandfather   . Healthy Brother   . Healthy Son        x1  . Healthy Daughter        x2    Social History   Tobacco Use  . Smoking status: Never Smoker  . Smokeless tobacco: Never Used  Substance Use Topics  . Alcohol use: Yes    Alcohol/week: 0.0 standard drinks    Comment: Rare  . Drug use: No    Home Medications Prior to Admission medications   Medication Sig Start Date End Date Taking? Authorizing Provider  albuterol (PROVENTIL HFA;VENTOLIN HFA) 108 (90 Base)  MCG/ACT inhaler Inhale 2 puffs into the lungs every 4 (four) hours as needed for wheezing or shortness of breath. 05/02/15   de Dios, Granite A, MD  amoxicillin-clavulanate (AUGMENTIN) 875-125 MG tablet Take 1 tablet by mouth every 12 (twelve) hours for 10 days. 09/28/19 10/08/19  Barry Culverhouse, PA-C  busPIRone (BUSPAR) 7.5 MG tablet Take 1 tablet (7.5 mg total) by mouth 3 (three) times daily. 10/24/16   Shelda Pal, DO  clonazePAM (KLONOPIN) 1 MG tablet Take 1 tablet (1 mg total) by mouth 2 (two) times daily as needed. 12/16/17   Shelda Pal, DO  DULoxetine (CYMBALTA) 60 MG capsule Take 1 capsule (60 mg total) by mouth daily. 01/22/17   Shelda Pal, DO  HYDROcodone-acetaminophen (NORCO/VICODIN)  5-325 MG tablet Take 2 tablets by mouth every 6 (six) hours as needed for severe pain. 09/28/19   Dorann Davidson, PA-C  nortriptyline (PAMELOR) 25 MG capsule Take 1 capsule (25 mg total) at bedtime by mouth. 11/06/16   Tomi Likens, Adam R, DO  Psyllium (METAMUCIL FIBER PO) Take 1 Dose by mouth 3 (three) times daily.    [provider]  SUMAtriptan (IMITREX) 50 MG tablet Take 1 tablet (50 mg total) by mouth every 2 (two) hours as needed for migraine. May repeat in 2 hours if headache persists or recurs. 10/24/16   Shelda Pal, DO    Allergies    Butrans [buprenorphine], Crestor [rosuvastatin calcium], and Sulfa antibiotics  Review of Systems   Review of Systems  Gastrointestinal: Positive for abdominal pain, constipation and nausea.    Physical Exam Updated Vital Signs BP (!) 146/94   Pulse 85   Temp 98.4 F (36.9 C) (Oral)   Resp 14   Ht 6\' 4"  (1.93 m)   Wt 122.5 kg   SpO2 99%   BMI 32.87 kg/m   Physical Exam Vitals and nursing note reviewed.  Constitutional:      General: He is not in acute distress.    Appearance: He is well-developed.     Comments: Appears uncomfortable due to pain, otherwise nontoxic  HENT:     Head: Normocephalic and atraumatic.  Eyes:     Conjunctiva/sclera: Conjunctivae normal.     Pupils: Pupils are equal, round, and reactive to light.  Cardiovascular:     Rate and Rhythm: Normal rate and regular rhythm.  Pulmonary:     Effort: Pulmonary effort is normal. No respiratory distress.     Breath sounds: Normal breath sounds. No wheezing.  Abdominal:     General: Bowel sounds are normal. There is no distension.     Palpations: Abdomen is soft.     Tenderness: There is abdominal tenderness in the left lower quadrant. There is guarding.     Comments: TTP of left lower quadrant abdomen.  Voluntary guarding.  No rigidity or distention.  Negative rebound.  No peritonitis.  No CVA tenderness.  Musculoskeletal:        General: Normal range  of motion.     Cervical back: Normal range of motion and neck supple.  Skin:    General: Skin is warm and dry.     Capillary Refill: Capillary refill takes less than 2 seconds.  Neurological:     Mental Status: He is alert and oriented to person, place, and time.     ED Results / Procedures / Treatments   Labs (all labs ordered are listed, but only abnormal results are displayed) Labs Reviewed  CBC WITH DIFFERENTIAL/PLATELET -  Abnormal; Notable for the following components:      Result Value   WBC 15.0 (*)    Platelets 453 (*)    Neutro Abs 11.5 (*)    Monocytes Absolute 1.3 (*)    Abs Immature Granulocytes 0.08 (*)    All other components within normal limits  COMPREHENSIVE METABOLIC PANEL - Abnormal; Notable for the following components:   Sodium 134 (*)    CO2 21 (*)    Glucose, Bld 125 (*)    ALT 49 (*)    All other components within normal limits  URINALYSIS, ROUTINE W REFLEX MICROSCOPIC    EKG None  Radiology CT ABDOMEN PELVIS W CONTRAST  Result Date: 09/28/2019 CLINICAL DATA:  Left lower abdominal pain EXAM: CT ABDOMEN AND PELVIS WITH CONTRAST TECHNIQUE: Multidetector CT imaging of the abdomen and pelvis was performed using the standard protocol following bolus administration of intravenous contrast. CONTRAST:  151mL OMNIPAQUE IOHEXOL 300 MG/ML  SOLN COMPARISON:  CT 04/01/2013 FINDINGS: Lower chest: Lung bases demonstrate no acute consolidation or effusion. Cardiac size within normal limits. Hepatobiliary: No focal liver abnormality is seen. No gallstones, gallbladder wall thickening, or biliary dilatation. Pancreas: Unremarkable. No pancreatic ductal dilatation or surrounding inflammatory changes. Spleen: Normal in size without focal abnormality. Adrenals/Urinary Tract: Adrenal glands are unremarkable. Kidneys are normal, without renal calculi, focal lesion, or hydronephrosis. Bladder is unremarkable. Stomach/Bowel: The stomach is nonenlarged. No dilated small bowel.  Negative appendix. Diverticular disease of the left colon. Wall thickening with moderate inflammatory change at the sigmoid colon. No definite extraluminal gas collection. Vascular/Lymphatic: Mild aortic atherosclerosis. No aneurysm. No suspicious nodes Reproductive: Prostate is unremarkable. Other: No free air. Small amount of fluid in the left lower quadrant. Small fat containing umbilical hernia Musculoskeletal: No acute or significant osseous findings. IMPRESSION: Findings consistent with acute sigmoid colon diverticulitis. No perforation or abscess. Small amount of free fluid in the left lower quadrant. Colonoscopy follow-up should be considered after resolution of acute episode to exclude other causes of sigmoid colon wall thickening. Aortic Atherosclerosis (ICD10-I70.0). Electronically Signed   By: Donavan Foil M.D.   On: 09/28/2019 19:08    Procedures Procedures (including critical care time)  Medications Ordered in ED Medications  amoxicillin-clavulanate (AUGMENTIN) 875-125 MG per tablet 1 tablet (has no administration in time range)  morphine 4 MG/ML injection 4 mg (4 mg Intravenous Given 09/28/19 1836)  sodium chloride 0.9 % bolus 1,000 mL (1,000 mLs Intravenous New Bag/Given 09/28/19 1840)  iohexol (OMNIPAQUE) 300 MG/ML solution 100 mL (100 mLs Intravenous Contrast Given 09/28/19 1849)    ED Course  I have reviewed the triage vital signs and the nursing notes.  Pertinent labs & imaging results that were available during my care of the patient were reviewed by me and considered in my medical decision making (see chart for details).    MDM Rules/Calculators/A&P                          Patient presented for evaluation of left lower quadrant abdominal pain.  On exam, patient present lateral to pain, otherwise nontoxic.  Pain is reproducible with palpation of the abdomen.  Consider diverticulitis.  Consider kidney stone.  Consider viral GI illness.  Labs obtained from triage interpreted  by me, shows leukocytosis, otherwise reassuring.  Will obtain CT abdomen pelvis for further evaluation.  Morphine and fluids given for symptom control.  CT abdomen pelvis consistent with acute diverticulitis.  On reassessment, patient reports symptoms  are much improved.  Heart rate has improved.  Discussed treatment with antibiotics and pain control.  Encourage follow-up with his GI doctor.  At this time, patient appears safe for discharge.  Return precautions given.  Patient states he understands and agrees to plan.  Final Clinical Impression(s) / ED Diagnoses Final diagnoses:  Diverticulitis    Rx / DC Orders ED Discharge Orders         Ordered    amoxicillin-clavulanate (AUGMENTIN) 875-125 MG tablet  Every 12 hours        09/28/19 2015    HYDROcodone-acetaminophen (NORCO/VICODIN) 5-325 MG tablet  Every 6 hours PRN        09/28/19 2015           Treacy Holcomb, PA-C 09/28/19 2016    Little, Wenda Overland, MD 09/29/19 1546

## 2019-09-28 NOTE — Discharge Instructions (Signed)
Take antibiotics as prescribed.  Take the entire course, even if your symptoms improve. Use Tylenol and ibuprofen as needed for mild to moderate pain.  Use Norco as needed for severe breakthrough pain.  Have caution, this may make you tired or groggy.  Do not drive or operate heavy machinery while taking this medicine. Make sure stay well-hydrated water. Follow-up with your GI doctor as needed for further evaluation. Return to the emergency room if you develop high fevers, persistent vomiting, severe worsening abdominal pain, or any new, worsening, or concerning symptoms.

## 2019-09-28 NOTE — ED Triage Notes (Signed)
C?o left lower abd pain x @ days

## 2020-01-04 ENCOUNTER — Telehealth: Payer: Self-pay | Admitting: Internal Medicine

## 2020-01-04 NOTE — Telephone Encounter (Signed)
Patient states he has insurance that [facility] is covered under now and would love to re-establish with him. I told him I would ask but there is no promises.   Please advise

## 2020-01-05 NOTE — Telephone Encounter (Signed)
OK 

## 2020-01-05 NOTE — Telephone Encounter (Signed)
Called patient to schedule appt. Left voice mail

## 2020-01-11 ENCOUNTER — Other Ambulatory Visit: Payer: Self-pay

## 2020-01-12 ENCOUNTER — Ambulatory Visit: Payer: Self-pay | Admitting: Family Medicine

## 2020-02-08 ENCOUNTER — Telehealth: Payer: Self-pay | Admitting: Internal Medicine

## 2020-02-08 NOTE — Telephone Encounter (Signed)
FYI   Patient has rescheduled appt twice first 01/12/20 (patient was sick), 2nd 02/09/20 (pt is out of town)  Is it ok to schedule again??

## 2020-02-09 ENCOUNTER — Ambulatory Visit: Payer: Self-pay | Admitting: Family Medicine

## 2020-02-09 NOTE — Telephone Encounter (Signed)
That's fine, if he late cancels or no shows, we won't reschedule. Ty.

## 2020-02-23 ENCOUNTER — Ambulatory Visit: Payer: Self-pay | Admitting: Family Medicine

## 2020-03-01 ENCOUNTER — Ambulatory Visit (INDEPENDENT_AMBULATORY_CARE_PROVIDER_SITE_OTHER): Payer: 59 | Admitting: Family Medicine

## 2020-03-01 ENCOUNTER — Encounter: Payer: Self-pay | Admitting: Family Medicine

## 2020-03-01 ENCOUNTER — Other Ambulatory Visit: Payer: Self-pay

## 2020-03-01 VITALS — BP 140/86 | HR 101 | Temp 97.8°F | Ht 77.0 in | Wt 286.2 lb

## 2020-03-01 DIAGNOSIS — F411 Generalized anxiety disorder: Secondary | ICD-10-CM

## 2020-03-01 DIAGNOSIS — F330 Major depressive disorder, recurrent, mild: Secondary | ICD-10-CM

## 2020-03-01 DIAGNOSIS — K117 Disturbances of salivary secretion: Secondary | ICD-10-CM

## 2020-03-01 DIAGNOSIS — H04123 Dry eye syndrome of bilateral lacrimal glands: Secondary | ICD-10-CM | POA: Diagnosis not present

## 2020-03-01 MED ORDER — CLONAZEPAM 1 MG PO TABS
0.5000 mg | ORAL_TABLET | Freq: Two times a day (BID) | ORAL | 2 refills | Status: DC | PRN
Start: 2020-03-01 — End: 2020-07-01

## 2020-03-01 MED ORDER — AMITRIPTYLINE HCL 25 MG PO TABS: 25.0000 mg | ORAL_TABLET | Freq: Every day | ORAL | 2 refills | Status: DC

## 2020-03-01 NOTE — Patient Instructions (Addendum)
Artificial tears like Refresh and Systane may be used for comfort. OK to get generic version. Generally people use them every 2-4 hours, but you can use them as much as you want because there is no medication in it.  Keep the diet clean and stay active.  Please consider counseling. Contact [phone] to schedule an appointment or inquire about cost/insurance coverage.  Let us know if you need anything.

## 2020-03-01 NOTE — Progress Notes (Signed)
Chief Complaint  Patient presents with  . New Patient (Initial Visit)    Re-establish care        New Patient Visit SUBJECTIVE: HPI: Kurt Mcdonald is an 55 y.o.male who is being seen for re-establishing care.   Anxiety/depression Patient has a history of anxiety and depression.  He is currently on Klonopin as needed.  His brother has stage IV cancer and he takes the medicine to 3 times a week on average.  His mother passed away 18 months ago from lung cancer and his brother now has the same diagnosis.  He is not following with a counselor or psychologist.  No homicidal or suicidal ideation.  He is not on a daily medication at this time.  Salivation Over the past several months, the patient has had increased salivation.  No change in his diet.  He is not taking any new medications or supplements.  He does not feel like his mouth is getting dry.  He is not choking and has no difficulty swallowing.  He will sometimes wake up in the middle the night and noticed he is drooling more.  He also notices he has more saliva when he speaks.  He drinks between 60 and 70 ounces of water daily.  Dry eyes The patient has been having dry eyes presenting as tearing.  He is not having any itching or vision changes.  There is no pain.  He has not tried anything so far at home.  Past Medical History:  Diagnosis Date  . Anxiety   . Chicken pox   . Chronic pain syndrome   . Depression   . Diverticulitis of colon without hemorrhage 06/04/2013  . Diverticulosis of colon   . Headache   . History of pneumonia   . Hypercholesteremia   . Irritable bowel syndrome (IBS)   . LBP (low back pain)   . Male hypogonadism   . NAFLD (nonalcoholic fatty liver disease)   . Overweight(278.02)   . Vision abnormalities    Past Surgical History:  Procedure Laterality Date  . shoulder sugery  01/2010   right  . TOOTH EXTRACTION  11/2011  . WISDOM TOOTH EXTRACTION     Family History  Problem Relation Age of Onset  .  Healthy Mother        Living  . Pneumonia Father 74       Deceased  . Bone cancer Paternal Grandfather   . Diabetes Maternal Grandfather   . Healthy Brother   . Healthy Son        x1  . Healthy Daughter        x2   Allergies  Allergen Reactions  . Butrans [Buprenorphine]     Felt funny  . Crestor [Rosuvastatin Calcium]     Severe leg cramping  . Sulfa Antibiotics     rash    Current Outpatient Medications:  .  amitriptyline (ELAVIL) 25 MG tablet, Take 1 tablet (25 mg total) by mouth at bedtime., Disp: 30 tablet, Rfl: 2 .  clonazePAM (KLONOPIN) 1 MG tablet, Take 0.5-1 tablets (0.5-1 mg total) by mouth 2 (two) times daily as needed., Disp: 60 tablet, Rfl: 2  OBJECTIVE: BP 140/86 (BP Location: Left Arm, Patient Position: Sitting, Cuff Size: Large)   Pulse (!) 101   Temp 97.8 F (36.6 C) (Oral)   Ht 6\' 5"  (1.956 m)   Wt 286 lb 4 oz (129.8 kg)   SpO2 95%   BMI 33.94 kg/m  General:  well  developed, well nourished, in no apparent distress Skin:  no significant moles, warts, or growths Nose:  nares patent, septum midline, mucosa normal Throat/Pharynx:  lips and gingiva without lesion; tongue and uvula midline; non-inflamed pharynx; no exudates or postnasal drainage, MMM without excessive saliva noted Lungs:  clear to auscultation, breath sounds equal bilaterally, no respiratory distress Cardio:  regular rate and rhythm, no LE edema or bruits Neuro:  gait normal Psych: well oriented with normal range of affect and appropriate judgment/insight  ASSESSMENT/PLAN: GAD (generalized anxiety disorder) - Plan: clonazePAM (KLONOPIN) 1 MG tablet, amitriptyline (ELAVIL) 25 MG tablet  Dry eye syndrome of both eyes  Hypersalivation - Plan: amitriptyline (ELAVIL) 25 MG tablet  MDD (major depressive disorder), recurrent episode, mild ([place])  1.  Continue Klonopin as needed.  Counseling information provided.  Counseled on exercise. 2.  Artificial tears recommended. 3.  Start trial of  Elavil 25 mg nightly.  He will follow-up in 1 month to recheck this in addition to having his physical. The patient voiced understanding and agreement to the plan.   [place], DO 03/01/20  10:29 AM

## 2020-04-11 ENCOUNTER — Encounter: Payer: Self-pay | Admitting: Family Medicine

## 2020-04-11 ENCOUNTER — Other Ambulatory Visit: Payer: Self-pay

## 2020-04-11 ENCOUNTER — Ambulatory Visit (INDEPENDENT_AMBULATORY_CARE_PROVIDER_SITE_OTHER): Payer: 59 | Admitting: Family Medicine

## 2020-04-11 VITALS — BP 142/100 | HR 94 | Temp 97.9°F | Ht 76.0 in | Wt 288.1 lb

## 2020-04-11 DIAGNOSIS — Z1159 Encounter for screening for other viral diseases: Secondary | ICD-10-CM

## 2020-04-11 DIAGNOSIS — G4733 Obstructive sleep apnea (adult) (pediatric): Secondary | ICD-10-CM | POA: Diagnosis not present

## 2020-04-11 DIAGNOSIS — R0600 Dyspnea, unspecified: Secondary | ICD-10-CM

## 2020-04-11 DIAGNOSIS — Z0001 Encounter for general adult medical examination with abnormal findings: Secondary | ICD-10-CM | POA: Diagnosis not present

## 2020-04-11 DIAGNOSIS — Z23 Encounter for immunization: Secondary | ICD-10-CM

## 2020-04-11 DIAGNOSIS — R030 Elevated blood-pressure reading, without diagnosis of hypertension: Secondary | ICD-10-CM | POA: Diagnosis not present

## 2020-04-11 DIAGNOSIS — K1370 Unspecified lesions of oral mucosa: Secondary | ICD-10-CM | POA: Diagnosis not present

## 2020-04-11 DIAGNOSIS — Z114 Encounter for screening for human immunodeficiency virus [HIV]: Secondary | ICD-10-CM

## 2020-04-11 DIAGNOSIS — Z125 Encounter for screening for malignant neoplasm of prostate: Secondary | ICD-10-CM

## 2020-04-11 DIAGNOSIS — Z0000 Encounter for general adult medical examination without abnormal findings: Secondary | ICD-10-CM

## 2020-04-11 LAB — COMPREHENSIVE METABOLIC PANEL
ALT: 27 U/L (ref 0–53)
AST: 20 U/L (ref 0–37)
Albumin: 4.1 g/dL (ref 3.5–5.2)
Alkaline Phosphatase: 63 U/L (ref 39–117)
BUN: 13 mg/dL (ref 6–23)
CO2: 30 mEq/L (ref 19–32)
Calcium: 9.7 mg/dL (ref 8.4–10.5)
Chloride: 104 mEq/L (ref 96–112)
Creatinine, Ser: 1.36 mg/dL (ref 0.40–1.50)
GFR: 58.88 mL/min — ABNORMAL LOW (ref >60.00)
Glucose, Bld: 91 mg/dL (ref 70–99)
Potassium: 4.8 mEq/L (ref 3.5–5.1)
Sodium: 141 mEq/L (ref 135–145)
Total Bilirubin: 0.9 mg/dL (ref 0.2–1.2)
Total Protein: 6.8 g/dL (ref 6.0–8.3)

## 2020-04-11 LAB — LIPID PANEL
Cholesterol: 204 mg/dL — ABNORMAL HIGH (ref 0–200)
HDL: 34.2 mg/dL — ABNORMAL LOW (ref >39.00)
LDL Cholesterol: 141 mg/dL — ABNORMAL HIGH (ref 0–99)
NonHDL: 169.65
Total CHOL/HDL Ratio: 6
Triglycerides: 144 mg/dL (ref 0.0–149.0)
VLDL: 28.8 mg/dL (ref 0.0–40.0)

## 2020-04-11 LAB — CBC
HCT: 48.9 % (ref 39.0–52.0)
Hemoglobin: 16.2 g/dL (ref 13.0–17.0)
MCHC: 33.2 g/dL (ref 30.0–36.0)
MCV: 89 fl (ref 78.0–100.0)
Platelets: 386 10*3/uL (ref 150.0–400.0)
RBC: 5.5 Mil/uL (ref 4.22–5.81)
RDW: 14.1 % (ref 11.5–15.5)
WBC: 7.8 10*3/uL (ref 4.0–10.5)

## 2020-04-11 LAB — PSA: PSA: 6.91 ng/mL — ABNORMAL HIGH (ref 0.10–4.00)

## 2020-04-11 NOTE — Patient Instructions (Addendum)
Give [place] 2-3 business days to get the results of your labs back.   Keep the diet clean and stay active.  If you do not hear anything about your referral in the next 1-2 weeks, call our office and ask for an update.  The new Shingrix vaccine (for shingles) is a 2 shot series. It can make people feel low energy, achy and almost like they have the flu for 48 hours after injection. Please plan accordingly when deciding on when to get this shot. Call our office for a nurse visit appointment to get this. The second shot of the series is less severe regarding the side effects, but it still lasts 48 hours.   Check your blood pressures 2-3 times per week, alternating the time of day you check it. If it is high, considering waiting 1-2 minutes and rechecking. If it gets higher, your anxiety is likely creeping up and we should avoid rechecking. Goal is <140/90.  Blood pressure monitor options include Omron (upper arm), Citizen (upper arm), Balance (upper arm), Medline (upper arm) and Boots (upper arm). Feel free to so research of your own. Wrist cuffs at a local pharmacy may be the cheapest option. To make sure your blood pressure cuff is accurate, bring it to your appointment.  Let us know if you need anything.

## 2020-04-11 NOTE — Progress Notes (Signed)
Chief Complaint  Patient presents with  . Annual Exam  . Shortness of Breath  . Tinnitus    Well Male Kurt Mcdonald is here for a complete physical.   His last physical was >1 year ago.  Current diet: in general, a "good" diet.  Current exercise: none Weight trend: stable Fatigue out of ordinary? No. Seat belt? Yes.   Loss of interest in activities or depression in last 2 weeks? No  Health maintenance Shingrix- No Colonoscopy- Yes Tetanus- Yes HIV- No Hep C- No   SOB 2 mo, getting worse. Not exertional always, but even when he is at rest. He does not smoke. He does have OSA, not on CPAP now.  Denies cough, URI s/s's, fevers, wheezing. Mom and siblings have been dx'd w lung cancer, but they were smokers.   The patient has also noticed a growth inside his mouth on the left.  There is no specific injury or triggering event.  It was sometimes grow.  Nothing appears to be draining from it and it is not bleeding.  No pain.   Past Medical History:  Diagnosis Date  . Anxiety   . Chicken pox   . Chronic pain syndrome   . Depression   . Diverticulitis of colon without hemorrhage 06/04/2013  . Diverticulosis of colon   . Headache   . History of pneumonia   . Hypercholesteremia   . Irritable bowel syndrome (IBS)   . LBP (low back pain)   . Male hypogonadism   . NAFLD (nonalcoholic fatty liver disease)   . Overweight(278.02)   . Vision abnormalities       Past Surgical History:  Procedure Laterality Date  . shoulder sugery  01/2010   right  . TOOTH EXTRACTION  11/2011  . WISDOM TOOTH EXTRACTION      Medications  Current Outpatient Medications on File Prior to Visit  Medication Sig Dispense Refill  . amitriptyline (ELAVIL) 25 MG tablet Take 1 tablet (25 mg total) by mouth at bedtime. 30 tablet 2  . clonazePAM (KLONOPIN) 1 MG tablet Take 0.5-1 tablets (0.5-1 mg total) by mouth 2 (two) times daily as needed. 60 tablet 2    Allergies Allergies  Allergen Reactions  .  Butrans [Buprenorphine]     Felt funny  . Crestor [Rosuvastatin Calcium]     Severe leg cramping  . Sulfa Antibiotics     rash    Family History Family History  Problem Relation Age of Onset  . Healthy Mother        Living  . Pneumonia Father 26       Deceased  . Bone cancer Paternal Grandfather   . Diabetes Maternal Grandfather   . Healthy Brother   . Healthy Son        x1  . Healthy Daughter        x2    Review of Systems: Constitutional:  no fevers Eye:  no recent significant change in vision Ear/Nose/Mouth/Throat:  Ears:  no hearing loss Nose/Mouth/Throat:  no complaints of nasal congestion, no sore throat Cardiovascular:  no chest pain Respiratory:  + shortness of breath Gastrointestinal:  + Intermittent constipation GU:  Male: negative for dysuria, frequency Musculoskeletal/Extremities:  no joint pain Integumentary (Skin/Breast):  no abnormal skin lesions reported Neurologic:  no headaches Endocrine: No unexpected weight changes Hematologic/Lymphatic:  no abnormal bleeding  Exam BP (!) 142/100 (BP Location: Left Arm, Patient Position: Sitting, Cuff Size: Large)   Pulse 94   Temp 97.9  F (36.6 C) (Oral)   Ht 6\' 4"  (1.93 m)   Wt 288 lb 2 oz (130.7 kg)   SpO2 96%   BMI 35.07 kg/m  General:  well developed, well nourished, in no apparent distress Skin:  no significant moles, warts, or growths Head:  no masses, lesions, or tenderness Eyes:  pupils equal and round, sclera anicteric without injection Ears:  canals without lesions, TMs shiny without retraction, no obvious effusion, no erythema Nose:  nares patent, septum midline, mucosa normal Throat/Pharynx:  lips and gingiva without lesion; tongue and uvula midline; non-inflamed pharynx; no exudates or postnasal drainage; on the left upper gum region near the first molars, there is a raised soft tissue lesion that is flesh-colored and slightly excoriated at the tip.  There is no erythema, ecchymosis, or  drainage. Neck: neck supple without adenopathy, thyromegaly, or masses Cardiac: RRR, no bruits, no LE edema Lungs:  clear to auscultation, breath sounds equal bilaterally, no respiratory distress Rectal: Deferred Musculoskeletal:  symmetrical muscle groups noted without atrophy or deformity Neuro:  gait normal; deep tendon reflexes normal and symmetric Psych: well oriented with normal range of affect and appropriate judgment/insight  Assessment and Plan  Well adult exam - Plan: CBC, Comprehensive metabolic panel, Lipid panel  Elevated blood pressure reading  Dyspnea, unspecified type - Plan: DG Chest 2 View  OSA (obstructive sleep apnea) - Plan: Ambulatory referral to Pulmonology  Encounter for hepatitis C screening test for low risk patient - Plan: Hepatitis C antibody  Screening for HIV (human immunodeficiency virus) - Plan: HIV Antibody (routine testing w rflx)  Screening for prostate cancer - Plan: PSA  Oral lesion - Plan: Ambulatory referral to ENT  Need for shingles vaccine - Plan: Varicella-zoster vaccine IM (Shingrix)   Well 55 y.o. male. Counseled on diet and exercise. Counseled on risks and benefits of prostate cancer screening with PSA. The patient agrees to undergo testing. Shingrix rec'd.  For the shortness of breath, I do not hear anything abnormal on his exam today.  We will check a chest x-ray.  If normal, will send in an inhaled corticosteroid. Monitor blood pressures at home.  Hold off a new medication today.  Recheck in 1 month. Refer to ENT for the oral lesion. Immunizations, labs, and further orders as above. The patient voiced understanding and agreement to the plan.  [place], DO 04/11/20 11:56 AM

## 2020-04-12 LAB — HEPATITIS C ANTIBODY
Hepatitis C Ab: NONREACTIVE
SIGNAL TO CUT-OFF: 0.01 (ref <1.00)

## 2020-04-12 LAB — HIV ANTIBODY (ROUTINE TESTING W REFLEX): HIV 1&2 Ab, 4th Generation: NONREACTIVE

## 2020-04-29 ENCOUNTER — Encounter (HOSPITAL_COMMUNITY): Payer: Self-pay

## 2020-04-29 ENCOUNTER — Other Ambulatory Visit: Payer: Self-pay

## 2020-04-29 ENCOUNTER — Emergency Department (HOSPITAL_COMMUNITY): Payer: 59

## 2020-04-29 ENCOUNTER — Emergency Department (HOSPITAL_COMMUNITY)
Admission: EM | Admit: 2020-04-29 | Discharge: 2020-04-29 | Disposition: A | Discharge status: Home / Self Care | Payer: 59 | Attending: Emergency Medicine | Admitting: Emergency Medicine

## 2020-04-29 ENCOUNTER — Telehealth: Payer: Self-pay

## 2020-04-29 DIAGNOSIS — R0602 Shortness of breath: Secondary | ICD-10-CM | POA: Insufficient documentation

## 2020-04-29 DIAGNOSIS — R519 Headache, unspecified: Secondary | ICD-10-CM

## 2020-04-29 DIAGNOSIS — I10 Essential (primary) hypertension: Secondary | ICD-10-CM | POA: Insufficient documentation

## 2020-04-29 DIAGNOSIS — R079 Chest pain, unspecified: Secondary | ICD-10-CM | POA: Diagnosis present

## 2020-04-29 DIAGNOSIS — J45909 Unspecified asthma, uncomplicated: Secondary | ICD-10-CM | POA: Insufficient documentation

## 2020-04-29 DIAGNOSIS — R072 Precordial pain: Secondary | ICD-10-CM

## 2020-04-29 LAB — CBC WITH DIFFERENTIAL/PLATELET
Abs Immature Granulocytes: 0.01 10*3/uL (ref 0.00–0.07)
Basophils Absolute: 0.1 10*3/uL (ref 0.0–0.1)
Basophils Relative: 1 %
Eosinophils Absolute: 0.2 10*3/uL (ref 0.0–0.5)
Eosinophils Relative: 2 %
HCT: 48.7 % (ref 39.0–52.0)
Hemoglobin: 16.4 g/dL (ref 13.0–17.0)
Immature Granulocytes: 0 %
Lymphocytes Relative: 29 %
Lymphs Abs: 2.2 10*3/uL (ref 0.7–4.0)
MCH: 29.8 pg (ref 26.0–34.0)
MCHC: 33.7 g/dL (ref 30.0–36.0)
MCV: 88.5 fL (ref 80.0–100.0)
Monocytes Absolute: 0.8 10*3/uL (ref 0.1–1.0)
Monocytes Relative: 11 %
Neutro Abs: 4.4 10*3/uL (ref 1.7–7.7)
Neutrophils Relative %: 57 %
Platelets: 410 10*3/uL — ABNORMAL HIGH (ref 150–400)
RBC: 5.5 MIL/uL (ref 4.22–5.81)
RDW: 13.4 % (ref 11.5–15.5)
WBC: 7.7 10*3/uL (ref 4.0–10.5)
nRBC: 0 % (ref 0.0–0.2)

## 2020-04-29 LAB — BASIC METABOLIC PANEL
Anion gap: 6 (ref 5–15)
BUN: 12 mg/dL (ref 6–20)
CO2: 24 mmol/L (ref 22–32)
Calcium: 9.2 mg/dL (ref 8.9–10.3)
Chloride: 104 mmol/L (ref 98–111)
Creatinine, Ser: 1.45 mg/dL — ABNORMAL HIGH (ref 0.61–1.24)
GFR, Estimated: 57 mL/min — ABNORMAL LOW (ref >60)
Glucose, Bld: 93 mg/dL (ref 70–99)
Potassium: 4.1 mmol/L (ref 3.5–5.1)
Sodium: 134 mmol/L — ABNORMAL LOW (ref 135–145)

## 2020-04-29 LAB — D-DIMER, QUANTITATIVE: D-Dimer, Quant: 0.57 ug/mL-FEU — ABNORMAL HIGH (ref 0.00–0.50)

## 2020-04-29 LAB — TROPONIN I (HIGH SENSITIVITY)
Troponin I (High Sensitivity): 7 ng/L (ref <18)
Troponin I (High Sensitivity): 8 ng/L (ref <18)

## 2020-04-29 MED ORDER — IOHEXOL 350 MG/ML SOLN
80.0000 mL | Freq: Once | INTRAVENOUS | Status: AC | PRN
  Administered 2020-04-29: 80 mL via INTRAVENOUS

## 2020-04-29 MED ORDER — TECHNETIUM TO 99M ALBUMIN AGGREGATED
4.4000 | Freq: Once | INTRAVENOUS | Status: AC | PRN
  Administered 2020-04-29: 4.4 via INTRAVENOUS

## 2020-04-29 MED ORDER — SODIUM CHLORIDE 0.9 % IV BOLUS
1000.0000 mL | Freq: Once | INTRAVENOUS | Status: AC
  Administered 2020-04-29: 1000 mL via INTRAVENOUS

## 2020-04-29 MED ORDER — ASPIRIN 81 MG PO CHEW
324.0000 mg | CHEWABLE_TABLET | Freq: Once | ORAL | Status: AC
  Administered 2020-04-29: 324 mg via ORAL
  Filled 2020-04-29: qty 4

## 2020-04-29 MED ORDER — LIDOCAINE VISCOUS HCL 2 % MT SOLN
15.0000 mL | Freq: Once | OROMUCOSAL | Status: AC
  Administered 2020-04-29: 15 mL via ORAL
  Filled 2020-04-29: qty 15

## 2020-04-29 MED ORDER — ALUM & MAG HYDROXIDE-SIMETH 200-200-20 MG/5ML PO SUSP
30.0000 mL | Freq: Once | ORAL | Status: AC
  Administered 2020-04-29: 30 mL via ORAL
  Filled 2020-04-29: qty 30

## 2020-04-29 MED ORDER — SUCRALFATE 1 G PO TABS: 1.0000 g | ORAL_TABLET | Freq: Three times a day (TID) | ORAL | 0 refills | Status: DC

## 2020-04-29 MED ORDER — PANTOPRAZOLE SODIUM 20 MG PO TBEC: 20.0000 mg | DELAYED_RELEASE_TABLET | Freq: Every day | ORAL | 0 refills | Status: DC

## 2020-04-29 NOTE — ED Notes (Signed)
Patient transported to CT 

## 2020-04-29 NOTE — ED Triage Notes (Signed)
Emergency Medicine Provider Triage Evaluation Note  Kurt Mcdonald , a 55 y.o. male  was evaluated in triage.  Pt complains of chest pain, headache, sob, ringing in ears and elevated BP ongoing for 1 month. States stress seems to make it worse. It was also worse when he played gold yesterday. He is not on bp meds. He was concerned about sxs so checked his bp at work and it was high so he came here. Denies nv.  Brother had MI at 28. No tobacco.  Review of Systems  Positive: Chest pain, sob Negative: nv  Physical Exam  BP (!) 163/117 (BP Location: Right Arm)   Pulse (!) 101   Temp 98.4 F (36.9 C) (Oral)   Resp 20   SpO2 97%  Gen:   Awake, no distress   HEENT:  Atraumatic  Resp:  Normal effort, CTAB  Cardiac:  Normal rate, regular rhythm Abd:   Nondistended, nontender  MSK:   Moves extremities without difficulty, trace ble edema Neuro:  Speech clear  Medical Decision Making  Medically screening exam initiated at 3:41 PM.  Appropriate orders placed.  Kurt Mcdonald was informed that the remainder of the evaluation will be completed by another provider, this initial triage assessment does not replace that evaluation, and the importance of remaining in the ED until their evaluation is complete.  Clinical Impression    MSE was initiated and I personally evaluated the patient and placed orders (if any) at  3:41 PM on April 29, 2020.  The patient appears stable so that the remainder of the MSE may be completed by another provider.    Rodney Booze, [place] 04/29/20 1541

## 2020-04-29 NOTE — ED Provider Notes (Signed)
[facility] Provider Note   CSN: [id number] Arrival date & time: 04/29/20  1512     History Chief Complaint  Patient presents with  . Chest Pain    Kurt Mcdonald is a 55 y.o. male history of hyperlipidemia, hypertension, neurolyse anxiety disorder, asthma.  Patient presents with chief complaint of chest pain, shortness of breath, and headache.  Patient reports that he has had shortness of breath intermittently over the last 2 months.  Patient reports shortness of breath is worse with exertion.  Patient reports that he was golfing yesterday and was very short of breath while playing.  Patient also reports that he had intermittent chest pain while playing golf as well.  Patient reports that he has been waking up out of breath.  Patient denies any cough or hemoptysis.  Patient has been referred to pulmonologist by his PCP but has not been able to follow-up with them yet.  Patient reports that he has had chest pain intermittently for last month.  Patient reports that he woke up this morning at 0 800 with complaint of chest pain.  Patient reports pain is midsternal with no radiation.  Patient describes it as pain as a pressure.  Pain has been constant since starting.  Pain is worse with exertion and better with rest.  At present patient rates his pain at 3/10 on the pain scale.  Patient denies any associated nausea, vomiting, shortness of breath, or diaphoresis.  Patient reports that he has been having bad heartburn over the last 5 days denies taking any medications regularly for heartburn.  States that the pain he is experiencing today is different than what he has felt over the last 5 days.  Patient reports that he developed a sudden onset of headache today approximately 1100.  Patient reports pain intensity 8/10 on the pain scale immediately upon headache onset.  Patient reports at present pain is 6/10 on the pain scale.  Patient endorses photophobia.   Patient denies any change with exertion.  Patient denies any alleviating factors.  Patient denies any associated facial asymmetry, slurred speech, numbness, weakness, syncope, near syncope, neck pain, back pain.  patient denied any history of headaches in the past.  Patient endorses decreased fluid intake over the last few days.  Patient denies any recent falls or injuries.  Patient denies any alcohol use, tobacco use, or illicit drug use.  Patient is currently not prescribed any medications for hypertension.  Patient denies any surgery or traumatic injuries in the last several weeks, history of cancer, hormone therapy, unilateral leg swelling or tenderness, history of PE or DVT, hormone use, hemoptysis.  HPI     Past Medical History:  Diagnosis Date  . Anxiety   . Chicken pox   . Chronic pain syndrome   . Depression   . Diverticulitis of colon without hemorrhage 06/04/2013  . Diverticulosis of colon   . Headache   . History of pneumonia   . Hypercholesteremia   . Irritable bowel syndrome (IBS)   . LBP (low back pain)   . Male hypogonadism   . NAFLD (nonalcoholic fatty liver disease)   . Overweight(278.02)   . Vision abnormalities     Patient Active Problem List   Diagnosis Date Noted  . GAD (generalized anxiety disorder) 03/01/2020  . MDD (major depressive disorder), recurrent episode, mild ([place]) 03/01/2020  . OSA (obstructive sleep apnea) 05/02/2015  . Asthma 05/02/2015  . Obesity 05/02/2015  . Exertional dyspnea 05/02/2015  .  Oral thrush 03/02/2015  . Multilevel degenerative disc disease 11/09/2014  . Chronic tension-type headache, intractable 09/14/2014  . Chronic migraine 08/20/2014  . Chronic fatigue 05/23/2014  . Witnessed apneic spells 05/23/2014  . Right-sided low back pain with right-sided sciatica 03/31/2014  . Mid back pain 03/31/2014  . Loss of libido 06/24/2013  . Abdominal pain 07/14/2012  . Abdominal pain, other specified site 07/01/2012  . Dyspnea  06/16/2010  . Hypercholesteremia 06/16/2010  . Overweight(278.02) 06/16/2010  . Diverticulosis 06/16/2010  . IBS (irritable bowel syndrome) 06/16/2010  . NAFLD (nonalcoholic fatty liver disease) 06/16/2010  . Hypogonadism male 06/16/2010  . Anxiety and depression 04/11/2010    Past Surgical History:  Procedure Laterality Date  . shoulder sugery  01/2010   right  . TOOTH EXTRACTION  11/2011  . WISDOM TOOTH EXTRACTION         Family History  Problem Relation Age of Onset  . Healthy Mother        Living  . Pneumonia Father 43       Deceased  . Bone cancer Paternal Grandfather   . Diabetes Maternal Grandfather   . Healthy Brother   . Healthy Son        x1  . Healthy Daughter        x2    Social History   Tobacco Use  . Smoking status: Never Smoker  . Smokeless tobacco: Never Used  Substance Use Topics  . Alcohol use: Yes    Alcohol/week: 0.0 standard drinks    Comment: Rare  . Drug use: No    Home Medications Prior to Admission medications   Medication Sig Start Date End Date Taking? Authorizing Provider  amitriptyline (ELAVIL) 25 MG tablet Take 1 tablet (25 mg total) by mouth at bedtime. 03/01/20  Yes Shelda Pal, DO  anastrozole (ARIMIDEX) 1 MG tablet Take 1 mg by mouth once a week. Mondays 04/23/20  Yes [provider]  clonazePAM (KLONOPIN) 1 MG tablet Take 0.5-1 tablets (0.5-1 mg total) by mouth 2 (two) times daily as needed. Patient taking differently: Take 1 mg by mouth as needed for anxiety. 03/01/20  Yes Wendling, Crosby Oyster, DO    Allergies    Butrans [buprenorphine], Crestor [rosuvastatin calcium], Fluoxetine, Duloxetine, and Sulfa antibiotics  Review of Systems   Review of Systems  Constitutional: Negative for chills, diaphoresis and fever.  Eyes: Negative for visual disturbance.  Respiratory: Positive for shortness of breath. Negative for cough.   Cardiovascular: Positive for chest pain. Negative for palpitations and leg  swelling.  Gastrointestinal: Negative for abdominal distention, abdominal pain, diarrhea, nausea and vomiting.  Genitourinary: Negative for difficulty urinating, dysuria, flank pain, frequency and hematuria.  Musculoskeletal: Negative for back pain, myalgias, neck pain and neck stiffness.  Skin: Negative for color change and rash.  Neurological: Positive for headaches. Negative for dizziness, tremors, seizures, syncope, facial asymmetry, speech difficulty, weakness, light-headedness and numbness.  Psychiatric/Behavioral: Negative for confusion.    Physical Exam Updated Vital Signs BP (!) 164/106   Pulse 86   Temp 98.4 F (36.9 C) (Oral)   Resp 13   SpO2 99%   Physical Exam Vitals and nursing note reviewed.  Constitutional:      General: He is not in acute distress.    Appearance: He is not ill-appearing, toxic-appearing or diaphoretic.  HENT:     Head: Normocephalic and atraumatic. No raccoon eyes, abrasion, contusion, masses, right periorbital erythema, left periorbital erythema or laceration.     Jaw: No  trismus or pain on movement.     Mouth/Throat:     Pharynx: Oropharynx is clear. Uvula midline.  Eyes:     General: No scleral icterus.       Right eye: No discharge.        Left eye: No discharge.     Extraocular Movements: Extraocular movements intact.     Pupils: Pupils are equal, round, and reactive to light.  Neck:     Vascular: No carotid bruit.  Cardiovascular:     Rate and Rhythm: Normal rate.     Pulses:          Carotid pulses are 3+ on the right side and 3+ on the left side.      Radial pulses are 2+ on the right side and 2+ on the left side.     Heart sounds: Normal heart sounds.  Pulmonary:     Effort: Pulmonary effort is normal. No tachypnea, bradypnea or prolonged expiration.     Breath sounds: Normal breath sounds. No stridor.  Abdominal:     Palpations: Abdomen is soft. There is no mass or pulsatile mass.     Tenderness: There is no abdominal  tenderness. There is no guarding or rebound.     Hernia: There is no hernia in the umbilical area or ventral area.  Musculoskeletal:     Cervical back: Normal range of motion and neck supple. No edema, erythema, signs of trauma, rigidity, torticollis or crepitus. No pain with movement, spinous process tenderness or muscular tenderness. Normal range of motion.     Right lower leg: No tenderness. No edema.     Left lower leg: No tenderness. No edema.  Skin:    General: Skin is warm and dry.     Coloration: Skin is not cyanotic or pale.  Neurological:     General: No focal deficit present.     Mental Status: He is alert and oriented to person, place, and time.     GCS: GCS eye subscore is 4. GCS verbal subscore is 5. GCS motor subscore is 6.     Cranial Nerves: No cranial nerve deficit or facial asymmetry.     Sensory: Sensation is intact.     Motor: No weakness, tremor, seizure activity or pronator drift.     Coordination: Romberg sign negative. Finger-Nose-Finger Test normal.     Gait: Gait is intact. Gait normal.     Comments: CN II-XII intact, equal grip strength, +5 strength to bilateral upper and lower extremities    Psychiatric:        Behavior: Behavior is cooperative.     ED Results / Procedures / Treatments   Labs (all labs ordered are listed, but only abnormal results are displayed) Labs Reviewed  CBC WITH DIFFERENTIAL/PLATELET - Abnormal; Notable for the following components:      Result Value   Platelets 410 (*)    All other components within normal limits  BASIC METABOLIC PANEL - Abnormal; Notable for the following components:   Sodium 134 (*)    Creatinine, Ser 1.45 (*)    GFR, Estimated 57 (*)    All other components within normal limits  D-DIMER, QUANTITATIVE - Abnormal; Notable for the following components:   D-Dimer, Quant 0.57 (*)    All other components within normal limits  TROPONIN I (HIGH SENSITIVITY)  TROPONIN I (HIGH SENSITIVITY)    EKG EKG  Interpretation  Date/Time:  Friday April 29 2020 15:35:52 EDT Ventricular Rate:  100 PR  Interval:  144 QRS Duration: 88 QT Interval:  334 QTC Calculation: 430 R Axis:   4 Text Interpretation: Normal sinus rhythm Inferior infarct , age undetermined no significant change since 2011 Confirmed by Sherwood Gambler [phone]) on 04/29/2020 5:22:28 PM   Radiology CT Angio Head W or Wo Contrast  Result Date: 04/29/2020 CLINICAL DATA:  Headache. Chest pain. Hypertension. Subarachnoid hemorrhage. EXAM: CT ANGIOGRAPHY HEAD AND NECK TECHNIQUE: Multidetector CT imaging of the head and neck was performed using the standard protocol during bolus administration of intravenous contrast. Multiplanar CT image reconstructions and MIPs were obtained to evaluate the vascular anatomy. Carotid stenosis measurements (when applicable) are obtained utilizing NASCET criteria, using the distal internal carotid diameter as the denominator. CONTRAST:  72mL OMNIPAQUE IOHEXOL 350 MG/ML SOLN COMPARISON:  None. FINDINGS: CT HEAD FINDINGS Brain: The brain shows a normal appearance without evidence of malformation, atrophy, old or acute small or large vessel infarction, mass lesion, hemorrhage, hydrocephalus or extra-axial collection. Vascular: There is atherosclerotic calcification of the major vessels at the base of the brain. Skull: Normal.  No traumatic finding.  No focal bone lesion. Sinuses/Orbits: Sinuses are clear. Orbits appear normal. Mastoids are clear. Other: None significant CTA NECK FINDINGS Aortic arch: Not included on the study. Right carotid system: Beginning above the lung apices, the common carotid artery is widely patent to the bifurcation. Carotid bifurcation is normal without soft or calcified plaque. Cervical ICA is normal. Left carotid system: Beginning above the lung apices, the common carotid artery is widely patent to the bifurcation. Minimal plaque at the carotid bifurcation but no stenosis. Cervical ICA is normal.  Vertebral arteries: Vertebral artery origins are widely patent. Both vertebral arteries appear normal through the cervical region. Skeleton: Mild midcervical spondylosis. Other neck: No lymphadenopathy or significant soft tissue mass. 9 mm thyroid nodule on the right. Upper chest: Not included. Review of the MIP images confirms the above findings CTA HEAD FINDINGS Anterior circulation: Both internal carotid arteries are widely patent through the skull base and siphon regions. The anterior and middle cerebral vessels are normal without large or medium vessel occlusion, proximal stenosis, aneurysm or vascular malformation. Posterior circulation: Both vertebral arteries widely patent through the foramen magnum to the basilar. No basilar stenosis. Posterior circulation branch vessels are normal. Venous sinuses: Patent and normal. Anatomic variants: None significant. Review of the MIP images confirms the above findings IMPRESSION: 1. Normal appearance of the brain itself. 2. No large or medium vessel occlusion or correctable proximal stenosis. 3. Minimal plaque at the left carotid bifurcation but no stenosis. Right carotid bifurcation normal. 4. 9 mm thyroid nodule on the right. No followup recommended (ref: J Am Coll Radiol. 2015 Feb;12(2): 143-50). 5. Aortic arch and brachiocephalic vessel origins from the arch were not included on the exam. Electronically Signed   By: Nelson Chimes M.D.   On: 04/29/2020 19:39   DG Chest 2 View  Result Date: 04/29/2020 CLINICAL DATA:  Chest pain EXAM: CHEST - 2 VIEW COMPARISON:  None. FINDINGS: The heart size and mediastinal contours are within normal limits. Both lungs are clear. No pleural effusion or pneumothorax. The visualized skeletal structures are unremarkable. IMPRESSION: No acute process in the chest. Electronically Signed   By: Macy Mis M.D.   On: 04/29/2020 16:39   CT Angio Neck W and/or Wo Contrast  Result Date: 04/29/2020 CLINICAL DATA:  Headache. Chest pain.  Hypertension. Subarachnoid hemorrhage. EXAM: CT ANGIOGRAPHY HEAD AND NECK TECHNIQUE: Multidetector CT imaging of the head and neck was  performed using the standard protocol during bolus administration of intravenous contrast. Multiplanar CT image reconstructions and MIPs were obtained to evaluate the vascular anatomy. Carotid stenosis measurements (when applicable) are obtained utilizing NASCET criteria, using the distal internal carotid diameter as the denominator. CONTRAST:  28mL OMNIPAQUE IOHEXOL 350 MG/ML SOLN COMPARISON:  None. FINDINGS: CT HEAD FINDINGS Brain: The brain shows a normal appearance without evidence of malformation, atrophy, old or acute small or large vessel infarction, mass lesion, hemorrhage, hydrocephalus or extra-axial collection. Vascular: There is atherosclerotic calcification of the major vessels at the base of the brain. Skull: Normal.  No traumatic finding.  No focal bone lesion. Sinuses/Orbits: Sinuses are clear. Orbits appear normal. Mastoids are clear. Other: None significant CTA NECK FINDINGS Aortic arch: Not included on the study. Right carotid system: Beginning above the lung apices, the common carotid artery is widely patent to the bifurcation. Carotid bifurcation is normal without soft or calcified plaque. Cervical ICA is normal. Left carotid system: Beginning above the lung apices, the common carotid artery is widely patent to the bifurcation. Minimal plaque at the carotid bifurcation but no stenosis. Cervical ICA is normal. Vertebral arteries: Vertebral artery origins are widely patent. Both vertebral arteries appear normal through the cervical region. Skeleton: Mild midcervical spondylosis. Other neck: No lymphadenopathy or significant soft tissue mass. 9 mm thyroid nodule on the right. Upper chest: Not included. Review of the MIP images confirms the above findings CTA HEAD FINDINGS Anterior circulation: Both internal carotid arteries are widely patent through the skull base  and siphon regions. The anterior and middle cerebral vessels are normal without large or medium vessel occlusion, proximal stenosis, aneurysm or vascular malformation. Posterior circulation: Both vertebral arteries widely patent through the foramen magnum to the basilar. No basilar stenosis. Posterior circulation branch vessels are normal. Venous sinuses: Patent and normal. Anatomic variants: None significant. Review of the MIP images confirms the above findings IMPRESSION: 1. Normal appearance of the brain itself. 2. No large or medium vessel occlusion or correctable proximal stenosis. 3. Minimal plaque at the left carotid bifurcation but no stenosis. Right carotid bifurcation normal. 4. 9 mm thyroid nodule on the right. No followup recommended (ref: J Am Coll Radiol. 2015 Feb;12(2): 143-50). 5. Aortic arch and brachiocephalic vessel origins from the arch were not included on the exam. Electronically Signed   By: Nelson Chimes M.D.   On: 04/29/2020 19:39   NM Pulmonary Perfusion  Result Date: 04/29/2020 CLINICAL DATA:  Chest pain, hypertension, short of breath EXAM: NUCLEAR MEDICINE PERFUSION LUNG SCAN TECHNIQUE: Perfusion images were obtained in multiple projections after intravenous injection of radiopharmaceutical. Ventilation scans intentionally deferred if perfusion scan and chest x-ray adequate for interpretation during COVID 19 epidemic. RADIOPHARMACEUTICALS:  4.4 mCi Tc-21m MAA IV COMPARISON:  04/29/2020 FINDINGS: There is normal distribution of radiotracer seen bilaterally. There are no perfusion defects identified. IMPRESSION: 1. Normal perfusion exam.  No evidence of pulmonary embolus. Electronically Signed   By: Randa Ngo M.D.   On: 04/29/2020 23:18    Procedures Procedures   Medications Ordered in ED Medications  alum & mag hydroxide-simeth (MAALOX/MYLANTA) 200-200-20 MG/5ML suspension 30 mL (30 mLs Oral Given 04/29/20 1833)    And  lidocaine (XYLOCAINE) 2 % viscous mouth solution 15  mL (15 mLs Oral Given 04/29/20 1833)  aspirin chewable tablet 324 mg (324 mg Oral Given 04/29/20 1833)  iohexol (OMNIPAQUE) 350 MG/ML injection 80 mL (80 mLs Intravenous Contrast Given 04/29/20 1932)  sodium chloride 0.9 % bolus 1,000 mL (0  mLs Intravenous Stopped 04/29/20 2211)  technetium albumin aggregated (MAA) injection solution 4.4 millicurie (4.4 millicuries Intravenous Contrast Given 04/29/20 2220)    ED Course  I have reviewed the triage vital signs and the nursing notes.  Pertinent labs & imaging results that were available during my care of the patient were reviewed by me and considered in my medical decision making (see chart for details).    MDM Rules/Calculators/A&P                          Alert 55 year old male no acute distress, nontoxic-appearing.  Patient presents with chief complaint of chest pain, shortness of breath, and headache.  Patient reports that he has had shortness of breath intermittently over the last 2 months.  Patient has had chest pain intermittently over the last 1 month.  Patient's chest pain began today with after woke from sleep at 0 800.  Pain was described as a constant pressure.  Pain was located midsternally with no radiation.  Patient reported pain was worse with exertion.  Patient endorses associated shortness of breath.  Chest pain work-up was ordered while patient was in triage.  BMP showed creatinine elevated at 1.45. CBC shows platelets slightly elevated at 410. Chest x-ray shows no active cardiopulmonary disease. EKG shows normal sinus rhythm with no significant change from previous tracing. Troponin 7. We will order D-dimer to evaluate for possible PE.  Will collect delta troponin.  Patient given 324 mg of aspirin and GI cocktail.  Patient reports that his headache started earlier today.  Patient reported sudden onset of headache.  Patient reports that pain was maximal at onset and has slightly decreased since starting.  Patient denies any change  in symptoms with exertion.  Patient denies any associated nausea, vomiting, facial asymmetry, slurred speech, syncope, lightheadedness, dizziness, focal neurological deficit.  Physical exam patient is not observed to have any focal neurological deficits.  Due to patient's sudden onset of pain and pain intensity at onset will order CTA of head and neck to evaluate for subarachnoid hemorrhage. CTA head and neck showed: 1) normal appearance of the brain 2) no large or medium vessel occlusion or correctable proximal stenosis 3) minimal plaque at the left carotid bifurcation but no stenosis 4) 9 mm thyroid nodule  D-dimer elevated at 0.57.  VQ scan was recommended by radiologist due to patient's increased creatinine and previously receiving contrast dye.   VQ scan shows normal perfusion scan no evidence of PE.  On serial repeat examinations patient reports complete resolution of his chest pain and headache with no recurrence of the symptoms.  Patient remained hemodynamically stable with improvement in his hypertension.  Per chart review patient has noted hypertension on previous well visit earlier this month.  We will have patient follow-up with his primary care provider for hypertension management.  Discussed results, findings, treatment and follow up. Patient advised of return precautions. Patient verbalized understanding and agreed with plan.   Final Clinical Impression(s) / ED Diagnoses Final diagnoses:  Precordial chest pain  Hypertension, unspecified type  Acute nonintractable headache, unspecified headache type    Rx / DC Orders ED Discharge Orders    None       Loni Beckwith, PA-C 04/30/20 [id number]    Sherwood Gambler, MD 05/01/20 1535

## 2020-04-29 NOTE — Telephone Encounter (Signed)
Nurse Assessment Nurse: Clovis Riley RN, Georgina Peer Date/Time Eilene Ghazi Time): 04/29/2020 12:37:49 PM Confirm and document reason for call. If symptomatic, describe symptoms. ---Caller states his blood pressure is high of 160/120 at 0800 and shortness of breath that is with exertion. Was seen in the office a couple of weeks ago. Does the patient have any new or worsening symptoms? ---Yes Will a triage be completed? ---Yes Related visit to physician within the last 2 weeks? ---Yes Does the PT have any chronic conditions? (i.e. diabetes, asthma, this includes High risk factors for pregnancy, etc.) ---Yes List chronic conditions. ---HTN, Is this a behavioral health or substance abuse call? ---No Guidelines Guideline Title Affirmed Question Affirmed Notes Nurse Date/Time (Eastern Time) Blood Pressure - High [0] Systolic BP >= 932 OR Diastolic >= 355 AND [7] cardiac or neurologic symptoms (e.g., chest pain, difficulty breathing, unsteady gait, blurred vision) Clovis Riley, RN, Georgina Peer 04/29/2020 12:39:48 PM PLEASE NOTE: All timestamps contained within this report are represented as [place] Standard Time. CONFIDENTIALTY NOTICE: This fax transmission is intended only for the addressee. It contains information that is legally privileged, confidential or otherwise protected from use or disclosure. If you are not the intended recipient, you are strictly prohibited from reviewing, disclosing, copying using or disseminating any of this information or taking any action in reliance on or regarding this information. If you have received this fax in error, please notify us immediately by telephone so that we can arrange for its return to [place]. Phone: [phone], Toll-Free: [phone], Fax: [phone] Page: 2 of 2 Call Id: [id number] Why. Time Eilene Ghazi Time) Disposition Final User 04/29/2020 12:35:04 PM Send to Urgent Zannie Kehr 04/29/2020 12:41:26 PM Go to ED Now Yes Clovis Riley, RN, Leilani Merl  Disagree/Comply Comply Caller Understands Yes PreDisposition Did not know what to do Care Advice Given Per Guideline GO TO ED NOW: * You need to be seen in the Emergency Department. * Go to the ED at ___________ [facility] now. Drive carefully. NOTE TO TRIAGER - DRIVING: * Another adult should drive. * Patient should not delay going to the emergency department. CALL EMS 911 IF: * Passes out or faints * Becomes confused * Becomes too weak to stand * You become worse CARE ADVICE given per High Blood Pressure (Adult) guideline. Referrals GO TO FACILITY OTHER - SPECIFY  Pt currently in ED.

## 2020-04-29 NOTE — ED Triage Notes (Signed)
Pt c/o chest pain, shortness of breath, high blood pressure and ringing in ears. Pt states this has been going on for a month but seemed to worsen today. Pt denies nausea and vomiting.

## 2020-04-29 NOTE — Discharge Instructions (Addendum)
You came to the emergency department to be evaluated for your headache, chest pain, and shortness of breath.  The CT scan of your head and neck showed no occlusion, aneurysm, or problems with your veins/arteries.  The scan did show that you have a 9 mm nodule on your thyroid, this will not require follow-up imaging.  The scan of your lungs showed no blood clots.  Your EKG and lab work were reassuring you not having acute heart attack at this time.  I have started you on Protonix, please take 1 pill daily.  I have also prescribed you Carafate.  Please take 1 pill by mouth 4 times daily with meals and at bedtime.  Please follow-up with your primary care provider for management of your high blood pressure and follow-up regarding your episode of chest pain today.  Get help right away if: Your chest pain gets worse. You have a cough that gets worse, or you cough up blood. You have severe pain in your abdomen. You faint. You have sudden, unexplained chest discomfort. You have sudden, unexplained discomfort in your arms, back, neck, or jaw. You have shortness of breath at any time. You suddenly start to sweat, or your skin gets clammy. You feel nausea or you vomit. You suddenly feel lightheaded or dizzy. You have severe weakness, or unexplained weakness or fatigue. Your heart begins to beat quickly, or it feels like it is skipping beats.

## 2020-05-11 ENCOUNTER — Ambulatory Visit (INDEPENDENT_AMBULATORY_CARE_PROVIDER_SITE_OTHER): Payer: 59 | Admitting: Family Medicine

## 2020-05-11 ENCOUNTER — Other Ambulatory Visit: Payer: Self-pay | Admitting: Family Medicine

## 2020-05-11 ENCOUNTER — Encounter: Payer: Self-pay | Admitting: Family Medicine

## 2020-05-11 ENCOUNTER — Other Ambulatory Visit: Payer: Self-pay

## 2020-05-11 ENCOUNTER — Other Ambulatory Visit (INDEPENDENT_AMBULATORY_CARE_PROVIDER_SITE_OTHER): Payer: 59

## 2020-05-11 VITALS — BP 142/98 | HR 76

## 2020-05-11 DIAGNOSIS — R030 Elevated blood-pressure reading, without diagnosis of hypertension: Secondary | ICD-10-CM | POA: Diagnosis not present

## 2020-05-11 DIAGNOSIS — E7800 Pure hypercholesterolemia, unspecified: Secondary | ICD-10-CM

## 2020-05-11 DIAGNOSIS — R972 Elevated prostate specific antigen [PSA]: Secondary | ICD-10-CM

## 2020-05-11 LAB — LIPID PANEL
Cholesterol: 183 mg/dL (ref 0–200)
HDL: 30.8 mg/dL — ABNORMAL LOW (ref >39.00)
LDL Cholesterol: 131 mg/dL — ABNORMAL HIGH (ref 0–99)
NonHDL: 152.46
Total CHOL/HDL Ratio: 6
Triglycerides: 107 mg/dL (ref 0.0–149.0)
VLDL: 21.4 mg/dL (ref 0.0–40.0)

## 2020-05-11 LAB — PSA: PSA: 6.42 ng/mL — ABNORMAL HIGH (ref 0.10–4.00)

## 2020-05-11 MED ORDER — LISINOPRIL 10 MG PO TABS: 10.0000 mg | ORAL_TABLET | Freq: Every day | ORAL | 0 refills | Status: DC

## 2020-05-11 NOTE — Progress Notes (Signed)
Pt here today for 1 month BP recheck. At last OV w/ Dr. Nani Ravens on 04/11/2020 BP was 142/100 with pulse of 94. No meds were added. He is also here today for PSA recheck and Lipid panel recheck. (see labs from 04/11/2020).   Vital signs: 05/11/2020 at 9:05am BP in L arm: 152/100 P: 75   Vital signs rechecked-05/11/20 at 9:12am BP in L arm: 142/98 P: 76  Has follow-up with PCP on 05/20/2020 to go over BP readings  Per Dr. Lorelei Pont- keep appointment with Dr. Nani Ravens, believe medications will be started, depending on Pt's preference to start meds today or wait for Dr. Nani Ravens. Spoke w/ Pt- he would like to go ahead with meds, however requests medication that would not cause leg cramping. Per Dr. Lorelei Pont- start lisinopril 10mg  1 tablet by mouth daily, Rx sent to [facility]. Instructed to follow-up with Dr. Nani Ravens as already scheduled.

## 2020-05-20 ENCOUNTER — Other Ambulatory Visit: Payer: Self-pay

## 2020-05-20 ENCOUNTER — Encounter: Payer: Self-pay | Admitting: Family Medicine

## 2020-05-20 ENCOUNTER — Ambulatory Visit (INDEPENDENT_AMBULATORY_CARE_PROVIDER_SITE_OTHER): Payer: 59 | Admitting: Family Medicine

## 2020-05-20 VITALS — BP 130/80 | HR 100 | Temp 98.4°F | Resp 18 | Ht 76.0 in | Wt 287.6 lb

## 2020-05-20 DIAGNOSIS — I10 Essential (primary) hypertension: Secondary | ICD-10-CM | POA: Diagnosis not present

## 2020-05-20 LAB — BASIC METABOLIC PANEL
BUN: 11 mg/dL (ref 6–23)
CO2: 27 mEq/L (ref 19–32)
Calcium: 9.6 mg/dL (ref 8.4–10.5)
Chloride: 104 mEq/L (ref 96–112)
Creatinine, Ser: 1.5 mg/dL (ref 0.40–1.50)
GFR: 52.31 mL/min — ABNORMAL LOW (ref >60.00)
Glucose, Bld: 88 mg/dL (ref 70–99)
Potassium: 4.7 mEq/L (ref 3.5–5.1)
Sodium: 139 mEq/L (ref 135–145)

## 2020-05-20 MED ORDER — LISINOPRIL 20 MG PO TABS: 20.0000 mg | ORAL_TABLET | Freq: Every day | ORAL | 3 refills | Status: DC

## 2020-05-20 NOTE — Patient Instructions (Addendum)
Keep the diet clean and stay active.  Continue checking your blood pressure at home.  Give [place] 2-3 business days to get the results of your labs back.   Take 2 tabs of your lisinopril until you run out, a new dosage has been sent in.   Let us know if you need anything.

## 2020-05-20 NOTE — Progress Notes (Signed)
Chief Complaint  Patient presents with  . Hypertension    Started start lisinopril 10mg  on 05/11/2020.    Subjective Kurt Mcdonald is a 56 y.o. male who presents for hypertension follow up. He does monitor home blood pressures. Blood pressures ranging from 120-130's/80-90's on average. He is compliant with medication- lisinopril 10 mg/d. Patient has these side effects of medication: none He is usually adhering to a healthy diet overall. Current exercise: nothing No Cp or SOB.    Past Medical History:  Diagnosis Date  . Anxiety   . Chicken pox   . Chronic pain syndrome   . Depression   . Diverticulitis of colon without hemorrhage 06/04/2013  . Diverticulosis of colon   . Headache   . History of pneumonia   . Hypercholesteremia   . Irritable bowel syndrome (IBS)   . LBP (low back pain)   . Male hypogonadism   . NAFLD (nonalcoholic fatty liver disease)   . Overweight(278.02)   . Vision abnormalities     Exam BP 130/80 (BP Location: Left Arm, Patient Position: Sitting, Cuff Size: Large)   Pulse 100   Temp 98.4 F (36.9 C) (Oral)   Resp 18   Ht 6\' 4"  (1.93 m)   Wt 287 lb 9.6 oz (130.5 kg)   SpO2 94%   BMI 35.01 kg/m  General:  well developed, well nourished, in no apparent distress Heart: RRR, no bruits, 1+ b/l pitting LE edema tapering at distal 1/3 of tibia Lungs: clear to auscultation, no accessory muscle use Psych: well oriented with normal range of affect and appropriate judgment/insight  Essential hypertension - Plan: lisinopril (ZESTRIL) 20 MG tablet, Basic metabolic panel  Chronic, uncontrolled.  Increase lisinopril from 10 mg daily to 20 mg daily.  Check BMP today.  Counseled on diet and exercise.  Continue to monitor blood pressure at home. F/u in 1 month. The patient voiced understanding and agreement to the plan.  [place], DO 05/20/20  11:56 AM

## 2020-06-01 ENCOUNTER — Encounter: Payer: Self-pay | Admitting: Family Medicine

## 2020-06-01 ENCOUNTER — Ambulatory Visit (INDEPENDENT_AMBULATORY_CARE_PROVIDER_SITE_OTHER): Payer: 59 | Admitting: Family Medicine

## 2020-06-01 ENCOUNTER — Other Ambulatory Visit: Payer: Self-pay

## 2020-06-01 VITALS — BP 110/74 | HR 84 | Temp 98.2°F | Ht 76.0 in | Wt 289.5 lb

## 2020-06-01 DIAGNOSIS — R1319 Other dysphagia: Secondary | ICD-10-CM

## 2020-06-01 MED ORDER — PANTOPRAZOLE SODIUM 40 MG PO TBEC: 40.0000 mg | DELAYED_RELEASE_TABLET | Freq: Every day | ORAL | 1 refills | Status: DC

## 2020-06-01 NOTE — Patient Instructions (Addendum)
If you do not hear anything about your referral in the next 1-2 weeks, call our office and ask for an update.   Chew your food thoroughly. Stay upright 30-60 minutes after meals.  Let us know if you need anything.

## 2020-06-01 NOTE — Progress Notes (Signed)
Chief Complaint  Patient presents with  . Digestive problems    Subjective: Patient is a 55 y.o. male here for difficulty swallowing.  Pt w hx of blockage in esophagus w hx of stretching. 1.5 mo ago, started coming back. He has been chewing very thoroughly but still has to drink a lot of fluid to push things thru. He thinks he may be losing weight.   Past Medical History:  Diagnosis Date  . Anxiety   . Chicken pox   . Chronic pain syndrome   . Depression   . Diverticulitis of colon without hemorrhage 06/04/2013  . Diverticulosis of colon   . Headache   . History of pneumonia   . Hypercholesteremia   . Irritable bowel syndrome (IBS)   . LBP (low back pain)   . Male hypogonadism   . NAFLD (nonalcoholic fatty liver disease)   . Overweight(278.02)   . Vision abnormalities     Objective: BP 110/74 (BP Location: Left Arm, Patient Position: Sitting, Cuff Size: Large)   Pulse 84   Temp 98.2 F (36.8 C) (Oral)   Ht 6\' 4"  (1.93 m)   Wt 289 lb 8 oz (131.3 kg)   SpO2 97%   BMI 35.24 kg/m  General: Awake, appears stated age HEENT: MMM, EOMi Heart: RRR, no murmurs Lungs: CTAB, no rales, wheezes or rhonchi. No accessory muscle use Abd: BS+, S, NT, ND MSK: +TTp at lower sternum Psych: Age appropriate judgment and insight, normal affect and mood  Assessment and Plan: Esophageal dysphagia - Plan: pantoprazole (PROTONIX) 40 MG tablet, Ambulatory referral to Gastroenterology  Refer back to GI. Trial PPI given assoc burning.  F/u as originally scheduled.  The patient voiced understanding and agreement to the plan.  [place], DO 06/01/20  2:52 PM

## 2020-06-02 NOTE — Telephone Encounter (Signed)
Can you start on patient's referral from 04/11/2020

## 2020-06-21 ENCOUNTER — Ambulatory Visit: Payer: 59 | Admitting: Family Medicine

## 2020-07-01 ENCOUNTER — Other Ambulatory Visit: Payer: Self-pay

## 2020-07-01 ENCOUNTER — Encounter: Payer: Self-pay | Admitting: Family Medicine

## 2020-07-01 ENCOUNTER — Ambulatory Visit (INDEPENDENT_AMBULATORY_CARE_PROVIDER_SITE_OTHER): Payer: 59 | Admitting: Family Medicine

## 2020-07-01 VITALS — BP 120/88 | HR 91 | Temp 98.6°F | Ht 76.0 in | Wt 283.4 lb

## 2020-07-01 DIAGNOSIS — I10 Essential (primary) hypertension: Secondary | ICD-10-CM

## 2020-07-01 DIAGNOSIS — F411 Generalized anxiety disorder: Secondary | ICD-10-CM

## 2020-07-01 DIAGNOSIS — M25562 Pain in left knee: Secondary | ICD-10-CM

## 2020-07-01 MED ORDER — CLONAZEPAM 1 MG PO TABS: 0.5000 mg | ORAL_TABLET | Freq: Two times a day (BID) | ORAL | 5 refills | Status: DC | PRN

## 2020-07-01 NOTE — Progress Notes (Signed)
Chief Complaint  Patient presents with   Hypertension   Knee Pain    Left knee pain     Subjective Kurt Mcdonald is a 55 y.o. male who presents for hypertension follow up. He does monitor home blood pressures. Blood pressures ranging from 140's/100's on average. He is compliant with medication- lisinopril 20 mg/d. Patient has these side effects of medication: none He is adhering to a healthy diet overall. Current exercise: walking, swimming No CP or SOB.   GAD Hx of anxiety, taking Elavil 25 mg/d and klonopin prn. Doing well. No AE's. No SI or HI. No self medication.   L  knee pain 1 week ago he twisted his knee while stepping out of a golf cart. Not getting better, aching pain. Located just below the knee cap. No swelling, redness, bruising, decreased ROM, catching/locking. No neuro/weakness. Tried Advil/Aleve w some relief.    Past Medical History:  Diagnosis Date   Anxiety    Chicken pox    Chronic pain syndrome    Depression    Diverticulitis of colon without hemorrhage 06/04/2013   Diverticulosis of colon    Headache    History of pneumonia    Hypercholesteremia    Irritable bowel syndrome (IBS)    LBP (low back pain)    Male hypogonadism    NAFLD (nonalcoholic fatty liver disease)    Overweight(278.02)    Vision abnormalities     Exam BP 120/88   Pulse 91   Temp 98.6 F (37 C) (Oral)   Ht 6\' 4"  (1.93 m)   Wt 283 lb 6 oz (128.5 kg)   SpO2 95%   BMI 34.49 kg/m  General:  well developed, well nourished, in no apparent distress Heart: RRR, no bruits, no LE edema Lungs: clear to auscultation, no accessory muscle use MSK: Nml active/passive ROM. TTP over patellar tendon and anterolateral jt line. Neg Lachman's, varus/valgus stress, patellar apprehension/grind, Stine's, McMurray's Psych: well oriented with normal range of affect and appropriate judgment/insight  Essential hypertension  GAD (generalized anxiety disorder) - Plan: clonazePAM (KLONOPIN) 1 MG  tablet  Acute pain of left knee  Chronic, stable. Cont Lisinopril 20 mg/d. Counseled on diet and exercise. Bring monitor to nurse BP visit in 3-4 weeks.  Cont Elavil 25 mg/d and Klonopin 0.5-1 mg qd prn.  NSAIDs, Tylenol, stretches/exercises, ice, heat. Sports med if no better.  F/u in 6 mo for med ck or prn. The patient voiced understanding and agreement to the plan.  [place], DO 07/01/20  3:58 PM

## 2020-07-01 NOTE — Patient Instructions (Addendum)
Bring your blood pressure monitor to your nurse visit.   Send me a message in 3-4 weeks if the knee isn't better.  Heat (pad or rice pillow in microwave) over affected area, 10-15 minutes twice daily.   Ice/cold pack over area for 10-15 min twice daily.  OK to take Tylenol 1000 mg (2 extra strength tabs) or 975 mg (3 regular strength tabs) every 6 hours as needed.  Ibuprofen 400-600 mg (2-3 over the counter strength tabs) every 6 hours as needed for pain.  Let us know if you need anything.  Patellar Tendinitis Rehab Ask your health care provider which exercises are safe for you. Do exercises exactly as told by your health care provider and adjust them as directed. It is normal to feel mild stretching, pulling, tightness, or discomfort as you do these exercises, but you should stop right away if you feel sudden pain or your pain gets worse. Do not begin these exercises until told by your health care provider. Stretching and range of motion exercises This exercise warms up your muscles and joints and improves the movement and flexibility of your knee. This exercise also helps to relieve pain and stiffness. Exercise A: Hamstring, doorway  Lie on your back in front of a doorway with your R leg resting against the wall and your other leg flat on the floor in the doorway. There should be a slight bend in your knee. Straighten your knee. You should feel a stretch behind your knee or thigh. If you do not, scoot your buttocks closer to the door. Hold this position for 30 seconds. Repeat2 times. Complete this stretch 3 times per week. Strengthening exercises These exercises build strength and endurance in your knee. Endurance is the ability to use your muscles for a long time, even after they get tired. Exercise B: Quadriceps, isometric  Lie on your back with your R leg extended and your other knee bent. Slowly tense the muscles in the front of your  thigh. When you do this, you should see your  kneecap slide up toward your hip or see increased dimpling just above the knee. This motion will push the back of your knee toward the floor. If this is painful, try putting a rolled-up hand towel under your knee to support it in a bent position. Change the size of the towel to find a position that allows you to do this exercise without any pain. For 3 seconds, hold the muscle as tight as you can without increasing your pain. Relax the muscles slowly and completely. Repeat 2 times. Complete this exercise 3 times per week. Exercise C: Straight leg raises ( quadriceps) Lie on your back with your R leg extended and your other knee bent. Tense the muscles in the front of your R thigh. When you do this, you should see your kneecap slide up or see increased dimpling just above the knee. Keep these muscles tight as you raise your leg 4-6 inches (10-15 cm) off the floor. Do not let your moving knee bend. Hold this position for 1 second. Keep these muscles tense as you slowly lower your leg. Relax your muscles slowly and completely. Repeat 2 times. Complete this exercise 3 times per week Exercise D: Squats Stand in front of a table, with your feet and knees pointing straight ahead. You may rest your hands on the table for balance but not for support. Slowly bend your knees and lower your hips like you are going to sit in a  chair. Keep your weight over your heels, not over your toes. Keep your lower legs upright so they are parallel with the table legs. Do not let your hips go lower than your knees. Do not bend lower than told by your health care provider. If your knee pain increases, do not bend as low. Hold the squat position for 1 seconds. Slowly push with your legs to return to standing. Do not use your hands to pull yourself to standing. Repeat 2 times. Complete this exercise 3 times per week. Exercise E: Step-downs Stand on the edge of a step. Keeping your weight over your R heel, slowly bend  your R knee to bring your R heel toward the floor. Lower your heel as far as you can while keeping control and without increasing any discomfort. Do not let your knee come forward. Use your leg muscles, not gravity, to lower your body. Hold a wall or rail for balance if needed. Slowly push through your heel to lift your body weight back up. Return to the starting position. Repeat2 times. Complete this exercise 3 times per week. Exercise F: Straight leg raises ( hip abductors) Lie on your side with your R leg in the top position. Lie so your head, shoulder, knee, and hip line up. You may bend your lower knee to help you keep your balance. Roll your hips slightly forward, so that your hips are stacked directly over each other and your R knee is facing forward. Leading with your heel, lift your top leg 4-6 inches (10-15 cm). You should feel the muscles in your outer hip lifting. Do not let your foot drift forward. Do not let your knee roll toward the ceiling. Hold this position for 3 seconds. Slowly lower your leg to the starting position. Let your muscles relax completely after each repetition. Repeat 2 times. Complete this exercise 3 times per week. This information is not intended to replace advice given to you by your health care provider. Make sure you discuss any questions you have with your health care provider. Document Released: 12/18/2004 Document Revised: 08/25/2015 Document Reviewed: 09/21/2014 Elsevier Interactive Patient Education  2018 [place] and range of motion exercises These exercises warm up your muscles and joints and improve the movement and flexibility of your knee. These exercises also help to relieve pain and stiffness.  Exercise A: Knee flexion, active Lie on your back with both knees straight. If this causes back discomfort, bend your uninjured knee so your foot is flat on the floor. Slowly slide your left / right heel back toward your buttocks  until you feel a gentle stretch in the front of your knee or thigh. Stop if you have pain. Hold for3 seconds. Slowly slide your left / right heel back to the starting position. 10 total repetitions. Repeat 2 times. Complete this exercise 3 times a week.  Exercise B: Knee extension, sitting Sit with your left / right heel propped on a chair, a coffee table, or a footstool. Do not have anything under your knee to support it. Allow your leg muscles to relax, letting gravity straighten out your knee. You should feel a stretch behind your left / right knee. If told by your health care provide just above your kneecap. Hold this position for 3 seconds. Repeat for a total of 10 repetitions. Repeat 2 times. Complete this stretch 3 times a week.  Strengthening exercises These exercises build strength and endurance in your knee. Endurance is the ability to  use your muscles for a long time, even after they get tired.  Exercise C: Quadriceps, isometric Lie on your back with your left / right leg extended and your other knee bent. Put a rolled towel or small pillow under your right/left knee if told by your health care provider. Slowly tense the muscles in the front of your left / right thigh by pushing the back of your knee down. You should see your knee cap slide up toward your hip or see increased dimpling just above the knee. For 3 seconds, keep the muscle as tight as you can without increasing your pain. Relax the muscles slowly and completely. Repeat for 10 total repetitions. Repeat 2 times. Complete this exercise 3 times a week. Exercise D: Straight leg raises (quadriceps) Lie on your back with your left / right leg extended and your other knee bent. Tense the muscles in the front of your left / right thigh. You should see your kneecap slide up or see increased dimpling just above the knee. Keep these muscles tight as you raise your leg 4-6 inches (10-15 cm) off the floor. Hold this position for 3  seconds. Keep these muscles tense as you lower your leg. Relax the muscles slowly and completely. Repeat for a total of 10 repetitions. Repeat 2 times. Complete this exercise 3 times a week.  Exercise E: Hamstring curls On the floor or a bed, lie on your abdomen with your legs straight. Put a folded towel or small pillow under your left / right thigh, just above your kneecap. Slowly bend your left / right knee as far as you can without pain. Keep your hips flat against the floor or bed. Hold this position for 3 seconds. Slowly lower your leg to the starting position. Repeat for a total of 10 repetitions. Repeat 2 times. Complete this exercise 3 times per week.  Stretching exercises These exercises warm up your muscles and joints and improve the movement and flexibility of your knee. These exercises also help to relieve pain and stiffness.  Exercise A: Quadriceps, prone Lie on your abdomen on a firm surface, such as a bed or padded floor. Bend your left / right knee and hold your ankle. If you cannot reach your ankle or pant leg, loop a belt around your foot and grab the belt instead. Gently pull your heel toward your buttocks. Your knee should not slide out to the side. You should feel a stretch in the front of your thigh and knee. Hold this position for 30 seconds. Repeat 2 times. Complete this stretch 3 times a week.  Exercise B: Hamstring, doorway Lie on your back in front of a doorway with your left / right leg resting against the wall and your other leg flat on the floor in the doorway. There should be a slight bend in your left / right knee. Straighten your left / right knee. You should feel a stretch behind your knee or thigh. If you do not feel that stretch, scoot your buttocks closer to the door. Hold this position for 30 seconds. Repeat 2 times. Complete this stretch 3 times a week.  Strengthening exercises These exercises build strength and endurance in your knee and leg  muscles. Endurance is the ability to use your muscles for a long time, even after they get tired.   Exercise D: Wall slides (quadriceps) Lean your back against a smooth wall or door, and walk your feet out 18-24 inches (45-61 cm) from it. Place your  feet hip-width apart. Slowly slide down the wall or door until your knees bend 90 degrees. Keep your knees over your heels, not over your toes. Keep your knees in line with your hips. Hold for 2 seconds. Stand up to rest for 60 seconds. Repeat 2 times. Complete this exercise 3 times a week.  Exercise E: Bridge (hip extensors) Lie on your back on a firm surface with your knees bent and your feet flat on the floor. Tighten your buttocks muscles and lift your bottom off the floor until your trunk is level with your thighs. Do not arch your back. You should feel the muscles working in your buttocks and the back of your thighs. Hold this position for 2 seconds. Slowly lower your hips to the starting position. Let your buttocks muscles relax completely between repetitions. Repeat 2 times. Complete this exercise 3 times a week.

## 2020-07-13 ENCOUNTER — Institutional Professional Consult (permissible substitution): Payer: 59 | Admitting: Pulmonary Disease

## 2020-08-29 ENCOUNTER — Ambulatory Visit: Payer: 59 | Admitting: Pulmonary Disease

## 2020-08-29 ENCOUNTER — Encounter: Payer: Self-pay | Admitting: Pulmonary Disease

## 2020-08-29 ENCOUNTER — Other Ambulatory Visit: Payer: Self-pay

## 2020-08-29 VITALS — BP 150/100 | HR 94 | Temp 98.3°F | Ht 76.0 in | Wt 270.6 lb

## 2020-08-29 DIAGNOSIS — G4733 Obstructive sleep apnea (adult) (pediatric): Secondary | ICD-10-CM | POA: Diagnosis not present

## 2020-08-29 NOTE — Progress Notes (Signed)
[facility]  Chief Complaint  Patient presents with   Consult    Pt states snoring so loud kids can hear in next room and wakes hisself up, SOB, and really dry mouth    Constitutional:  BP (!) 150/100 (BP Location: Left Arm, Patient Position: Sitting, Cuff Size: Large)   Pulse 94   Temp 98.3 F (36.8 C) (Oral)   Ht '6\' 4"'$  (1.93 m)   Wt 270 lb 9.6 oz (122.7 kg)   SpO2 97%   BMI 32.94 kg/m   Past Medical History:  Anxiety, Chronic pain, Depression, Diverticulitis, Headache, Pneumonia, HLD, IBS, Hypogonadism, NASH, HTN  Past Surgical History:  He  has a past surgical history that includes shoulder sugery (01/2010); Tooth extraction (11/2011); and Wisdom tooth extraction.  Brief Summary:  Kurt Mcdonald is a 56 y.o. male with obstructive sleep apnea.      Subjective:   He was seen by Dr. Corrie Dandy in 2017.  He had sleep study that showed mild sleep apnea.  He was started on CPAP.  Had trouble with mask fit and pressure setting.  He stopped using CPAP.  His kids told him his snoring is getting worse.  He is more tired during the day.  He has gained about 15 lbs since he had previous sleep study.  He also gets cramps in his legs few times per week.  He goes to sleep at 11 pm.  He falls asleep in few minutes.  He wakes up 1 or 2 times to use the bathroom.  He gets out of bed at 7 am.  He feels tired in the morning.  He denies morning headache.  He does not use anything to help him fall sleep or stay awake.  He denies sleep walking, sleep talking, bruxism, or nightmares.  There is no history of restless legs.  He denies sleep hallucinations, sleep paralysis, or cataplexy.  The Epworth score is 11 out of 24.   Physical Exam:   Appearance - well kempt   ENMT - no sinus tenderness, no oral exudate, no LAN, Mallampati 4 airway, no stridor  Respiratory - equal breath sounds bilaterally, no wheezing or rales  CV - s1s2 regular rate and  rhythm, no murmurs  Ext - no clubbing, no edema  Skin - no rashes  Psych - normal mood and affect   Pulmonary testing:  PFT 09/14/15 >> FEV1 5.73 (123%), FEV1% 85, TLC 9.45 (117%), DLCO 78%  Chest Imaging:  V/Q scan 04/29/20 >> normal  Sleep Tests:  PSG 05/06/15 >> AHI 6, SpO2 low 78%  Cardiac Tests:    Social History:  He  reports that he has never smoked. He has never used smokeless tobacco. He reports current alcohol use. He reports that he does not use drugs.  Family History:  His family history includes Bone cancer in his paternal grandfather; Diabetes in his maternal grandfather; Healthy in his brother, daughter, mother, and son; Pneumonia (age of onset: 63) in his father.    Discussion:  He has snoring, sleep disruption, apnea and daytime sleepiness.  He has history of hypertension and depression.  He had previous sleep study showing mild sleep apnea, but has gained weight since then.  He had trouble with previous CPAP set up due to mask fit and pressure settings.  Assessment/Plan:   Snoring with excessive daytime sleepiness. - will need to arrange for a home sleep study  Obesity. - discussed how weight can  impact sleep and risk for sleep disordered breathing - discussed options to assist with weight loss: combination of diet modification, cardiovascular and strength training exercises  Cardiovascular risk. - had an extensive discussion regarding the adverse health consequences related to untreated sleep disordered breathing - specifically discussed the risks for hypertension, coronary artery disease, cardiac dysrhythmias, cerebrovascular disease, and diabetes - lifestyle modification discussed  Safe driving practices. - discussed how sleep disruption can increase risk of accidents, particularly when driving - safe driving practices were discussed  Therapies for obstructive sleep apnea. - if the sleep study shows significant sleep apnea, then various therapies for  treatment were reviewed: CPAP, oral appliance, and surgical interventions  Time Spent Involved in Patient Care on Day of Examination:  33 minutes  Follow up:   Patient Instructions  Will arrange home for sleep study Will call to arrange for follow up after sleep study reviewed  Medication List:   Allergies as of 08/29/2020       Reactions   Butrans [buprenorphine] Other (See Comments)   Felt funny   Crestor [rosuvastatin Calcium] Other (See Comments)   Severe leg cramping   Fluoxetine Other (See Comments)   GI Upset (intolerance), Headache   Duloxetine Nausea Only   Sulfa Antibiotics Rash        Medication List        Accurate as of August 29, 2020 12:37 PM. If you have any questions, ask your nurse or doctor.          amitriptyline 25 MG tablet Commonly known as: ELAVIL Take 1 tablet (25 mg total) by mouth at bedtime.   anastrozole 1 MG tablet Commonly known as: ARIMIDEX Take 1 mg by mouth once a week. Mondays   clonazePAM 1 MG tablet Commonly known as: KLONOPIN Take 0.5-1 tablets (0.5-1 mg total) by mouth 2 (two) times daily as needed.   lisinopril 20 MG tablet Commonly known as: ZESTRIL Take 1 tablet (20 mg total) by mouth daily.   pantoprazole 40 MG tablet Commonly known as: PROTONIX Take 1 tablet (40 mg total) by mouth daily.        Signature:  Chesley Mires, MD [facility] Pager - [phone] 08/29/2020, 12:37 PM

## 2020-08-29 NOTE — Patient Instructions (Signed)
Will arrange home for sleep study Will call to arrange for follow up after sleep study reviewed

## 2020-09-11 ENCOUNTER — Other Ambulatory Visit: Payer: Self-pay | Admitting: Family Medicine

## 2020-09-11 DIAGNOSIS — R1319 Other dysphagia: Secondary | ICD-10-CM

## 2020-09-21 ENCOUNTER — Encounter: Payer: Self-pay | Admitting: Family Medicine

## 2020-09-21 ENCOUNTER — Ambulatory Visit: Payer: 59 | Admitting: Family Medicine

## 2020-09-21 ENCOUNTER — Other Ambulatory Visit: Payer: Self-pay

## 2020-09-21 VITALS — BP 122/86 | HR 93 | Temp 98.2°F | Ht 76.0 in | Wt 271.4 lb

## 2020-09-21 DIAGNOSIS — I959 Hypotension, unspecified: Secondary | ICD-10-CM | POA: Diagnosis not present

## 2020-09-21 DIAGNOSIS — Z23 Encounter for immunization: Secondary | ICD-10-CM | POA: Diagnosis not present

## 2020-09-21 DIAGNOSIS — K1370 Unspecified lesions of oral mucosa: Secondary | ICD-10-CM

## 2020-09-21 DIAGNOSIS — T50905A Adverse effect of unspecified drugs, medicaments and biological substances, initial encounter: Secondary | ICD-10-CM

## 2020-09-21 LAB — COMPREHENSIVE METABOLIC PANEL
ALT: 22 U/L (ref 0–53)
AST: 20 U/L (ref 0–37)
Albumin: 4.2 g/dL (ref 3.5–5.2)
Alkaline Phosphatase: 51 U/L (ref 39–117)
BUN: 20 mg/dL (ref 6–23)
CO2: 27 mEq/L (ref 19–32)
Calcium: 9.4 mg/dL (ref 8.4–10.5)
Chloride: 103 mEq/L (ref 96–112)
Creatinine, Ser: 1.6 mg/dL — ABNORMAL HIGH (ref 0.40–1.50)
GFR: 48.29 mL/min — ABNORMAL LOW (ref >60.00)
Glucose, Bld: 74 mg/dL (ref 70–99)
Potassium: 4.9 mEq/L (ref 3.5–5.1)
Sodium: 137 mEq/L (ref 135–145)
Total Bilirubin: 1 mg/dL (ref 0.2–1.2)
Total Protein: 7 g/dL (ref 6.0–8.3)

## 2020-09-21 NOTE — Patient Instructions (Addendum)
Give [place] 2-3 business days to get the results of your labs back.   Strong work with your weight loss.  Keep an eye on your blood pressure over the next month or so. Goal is <140/90.   Stay hydrated.  Warm up when playing golf.  If you do not hear anything about your referral in the next 1-2 weeks, call our office and ask for an update.  Let us know if you need anything.

## 2020-09-21 NOTE — Progress Notes (Signed)
Chief Complaint  Patient presents with   Hypotension    Dizzy     Subjective Kurt Mcdonald is a 55 y.o. male who presents for hypertension follow up. He does monitor home blood pressures. Blood pressures dropping from 70's/50's after he plays golf and  He is compliant with medication- lisinopril 20 mg/d. Patient has these side effects of medication: dizziness/lightheadedness He is adhering to a healthy diet overall. Current exercise: walking He lost 20 lbs.  No CP, palpitations, or SOB.    Past Medical History:  Diagnosis Date   Anxiety    Chicken pox    Chronic pain syndrome    Depression    Diverticulitis of colon without hemorrhage 06/04/2013   Diverticulosis of colon    Headache    History of pneumonia    Hypercholesteremia    Hypertension    Irritable bowel syndrome (IBS)    LBP (low back pain)    Male hypogonadism    NAFLD (nonalcoholic fatty liver disease)    Overweight(278.02)    Vision abnormalities     Exam BP 122/86   Pulse 93   Temp 98.2 F (36.8 C) (Oral)   Ht 6\' 4"  (1.93 m)   Wt 271 lb 6 oz (123.1 kg)   SpO2 97%   BMI 33.03 kg/m  General:  well developed, well nourished, in no apparent distress Heart: RRR, no bruits, no LE edema Lungs: clear to auscultation, no accessory muscle use Psych: well oriented with normal range of affect and appropriate judgment/insight  Hypotension, unspecified hypotension type - Plan: CBC, Comprehensive metabolic panel  Adverse effect of drug, initial encounter  Oral lesion - Plan: Ambulatory referral to ENT  Adverse effect of lisinopril tx'ing chronic hypertension. Lost weight, BP likely came down with it. Stop lisinopril, monitor BP at home. Counseled on diet and exercise. Pt never received call for oral lesion. Will refer back to ENT.  F/u as originally scheduled. The patient voiced understanding and agreement to the plan.  [place], DO 09/21/20  1:40 PM

## 2020-09-21 NOTE — Addendum Note (Signed)
Addended by: Sharon Seller B on: 09/21/2020 02:06 PM   Modules accepted: Orders

## 2020-09-22 LAB — CBC
HCT: 45.7 % (ref 39.0–52.0)
Hemoglobin: 14.9 g/dL (ref 13.0–17.0)
MCHC: 32.6 g/dL (ref 30.0–36.0)
MCV: 90.1 fl (ref 78.0–100.0)
Platelets: 383 10*3/uL (ref 150.0–400.0)
RBC: 5.08 Mil/uL (ref 4.22–5.81)
RDW: 16 % — ABNORMAL HIGH (ref 11.5–15.5)
WBC: 7.2 10*3/uL (ref 4.0–10.5)

## 2020-09-23 ENCOUNTER — Other Ambulatory Visit (INDEPENDENT_AMBULATORY_CARE_PROVIDER_SITE_OTHER): Payer: 59

## 2020-09-23 DIAGNOSIS — I959 Hypotension, unspecified: Secondary | ICD-10-CM

## 2020-09-23 LAB — IBC + FERRITIN
Ferritin: 14 ng/mL — ABNORMAL LOW (ref 22.0–322.0)
Iron: 91 ug/dL (ref 42–165)
Saturation Ratios: 19.6 % — ABNORMAL LOW (ref 20.0–50.0)
TIBC: 463.4 ug/dL — ABNORMAL HIGH (ref 250.0–450.0)
Transferrin: 331 mg/dL (ref 212.0–360.0)

## 2020-09-26 ENCOUNTER — Other Ambulatory Visit: Payer: Self-pay | Admitting: Family Medicine

## 2020-09-26 DIAGNOSIS — E611 Iron deficiency: Secondary | ICD-10-CM

## 2020-09-26 NOTE — Progress Notes (Signed)
r 

## 2020-09-27 ENCOUNTER — Other Ambulatory Visit: Payer: 59

## 2020-09-27 ENCOUNTER — Other Ambulatory Visit: Payer: Self-pay | Admitting: Family Medicine

## 2020-09-27 DIAGNOSIS — E611 Iron deficiency: Secondary | ICD-10-CM

## 2020-10-23 ENCOUNTER — Other Ambulatory Visit: Payer: Self-pay | Admitting: Family Medicine

## 2020-10-23 DIAGNOSIS — R1319 Other dysphagia: Secondary | ICD-10-CM

## 2020-10-30 DIAGNOSIS — G4733 Obstructive sleep apnea (adult) (pediatric): Secondary | ICD-10-CM

## 2020-10-31 NOTE — Telephone Encounter (Signed)
Called and spoke to patient regarding my chart message. Advised him most of the insurance wont cover in lab sleep studies. Patient stated he is willing to try the in lab sleep study even if insurance does not cover the cost. Dr. Halford Chessman please advise if you're okay with an in lab sleep study being done instead of a HST?

## 2020-11-24 ENCOUNTER — Emergency Department (HOSPITAL_BASED_OUTPATIENT_CLINIC_OR_DEPARTMENT_OTHER)
Admission: EM | Admit: 2020-11-24 | Discharge: 2020-11-24 | Disposition: A | Discharge status: Home / Self Care | Payer: 59 | Attending: Emergency Medicine | Admitting: Emergency Medicine

## 2020-11-24 ENCOUNTER — Emergency Department (HOSPITAL_BASED_OUTPATIENT_CLINIC_OR_DEPARTMENT_OTHER): Payer: 59

## 2020-11-24 ENCOUNTER — Encounter (HOSPITAL_BASED_OUTPATIENT_CLINIC_OR_DEPARTMENT_OTHER): Payer: Self-pay | Admitting: Emergency Medicine

## 2020-11-24 ENCOUNTER — Other Ambulatory Visit: Payer: Self-pay

## 2020-11-24 DIAGNOSIS — S0990XA Unspecified injury of head, initial encounter: Secondary | ICD-10-CM | POA: Diagnosis present

## 2020-11-24 DIAGNOSIS — J45909 Unspecified asthma, uncomplicated: Secondary | ICD-10-CM | POA: Insufficient documentation

## 2020-11-24 DIAGNOSIS — I10 Essential (primary) hypertension: Secondary | ICD-10-CM | POA: Diagnosis not present

## 2020-11-24 DIAGNOSIS — S060XAA Concussion with loss of consciousness status unknown, initial encounter: Secondary | ICD-10-CM | POA: Insufficient documentation

## 2020-11-24 DIAGNOSIS — Y92410 Unspecified street and highway as the place of occurrence of the external cause: Secondary | ICD-10-CM | POA: Insufficient documentation

## 2020-11-24 MED ORDER — ACETAMINOPHEN 500 MG PO TABS
1000.0000 mg | ORAL_TABLET | Freq: Once | ORAL | Status: AC
  Administered 2020-11-24: 1000 mg via ORAL
  Filled 2020-11-24: qty 2

## 2020-11-24 MED ORDER — ONDANSETRON 4 MG PO TBDP
4.0000 mg | ORAL_TABLET | Freq: Once | ORAL | Status: AC
  Administered 2020-11-24: 4 mg via ORAL
  Filled 2020-11-24: qty 1

## 2020-11-24 MED ORDER — CYCLOBENZAPRINE HCL 5 MG PO TABS: 10.0000 mg | ORAL_TABLET | Freq: Two times a day (BID) | ORAL | 0 refills | Status: AC | PRN
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
## 2020-11-24 NOTE — ED Notes (Signed)
Patient transported to CT 
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
## 2020-11-29 ENCOUNTER — Ambulatory Visit (INDEPENDENT_AMBULATORY_CARE_PROVIDER_SITE_OTHER): Payer: 59 | Admitting: Family Medicine

## 2020-11-29 ENCOUNTER — Encounter: Payer: Self-pay | Admitting: Family Medicine

## 2020-11-29 ENCOUNTER — Other Ambulatory Visit: Payer: Self-pay

## 2020-11-29 ENCOUNTER — Ambulatory Visit: Payer: 59

## 2020-11-29 VITALS — BP 150/92 | HR 97 | Temp 98.0°F | Ht 76.0 in | Wt 278.2 lb

## 2020-11-29 DIAGNOSIS — A09 Infectious gastroenteritis and colitis, unspecified: Secondary | ICD-10-CM | POA: Diagnosis not present

## 2020-11-29 DIAGNOSIS — S060X1A Concussion with loss of consciousness of 30 minutes or less, initial encounter: Secondary | ICD-10-CM

## 2020-11-29 MED ORDER — AZITHROMYCIN 500 MG PO TABS: 500.0000 mg | ORAL_TABLET | Freq: Every day | ORAL | 0 refills | Status: DC
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
## 2020-12-01 ENCOUNTER — Ambulatory Visit: Payer: 59

## 2020-12-01 ENCOUNTER — Other Ambulatory Visit: Payer: Self-pay

## 2020-12-01 DIAGNOSIS — G4733 Obstructive sleep apnea (adult) (pediatric): Secondary | ICD-10-CM

## 2020-12-05 ENCOUNTER — Encounter: Payer: Self-pay | Admitting: Family Medicine

## 2020-12-06 ENCOUNTER — Encounter: Payer: Self-pay | Admitting: Family Medicine

## 2020-12-06 ENCOUNTER — Ambulatory Visit (INDEPENDENT_AMBULATORY_CARE_PROVIDER_SITE_OTHER): Payer: 59 | Admitting: Family Medicine

## 2020-12-06 VITALS — BP 132/78 | HR 87 | Temp 97.7°F | Ht 76.0 in | Wt 280.2 lb

## 2020-12-06 DIAGNOSIS — R1030 Lower abdominal pain, unspecified: Secondary | ICD-10-CM | POA: Diagnosis not present

## 2020-12-06 DIAGNOSIS — R195 Other fecal abnormalities: Secondary | ICD-10-CM

## 2020-12-06 LAB — COMPREHENSIVE METABOLIC PANEL
ALT: 19 U/L (ref 0–53)
AST: 17 U/L (ref 0–37)
Albumin: 4.1 g/dL (ref 3.5–5.2)
Alkaline Phosphatase: 51 U/L (ref 39–117)
BUN: 14 mg/dL (ref 6–23)
CO2: 27 mEq/L (ref 19–32)
Calcium: 9.6 mg/dL (ref 8.4–10.5)
Chloride: 102 mEq/L (ref 96–112)
Creatinine, Ser: 1.28 mg/dL (ref 0.40–1.50)
GFR: 63.03 mL/min (ref >60.00)
Glucose, Bld: 102 mg/dL — ABNORMAL HIGH (ref 70–99)
Potassium: 4.5 mEq/L (ref 3.5–5.1)
Sodium: 139 mEq/L (ref 135–145)
Total Bilirubin: 0.8 mg/dL (ref 0.2–1.2)
Total Protein: 6.7 g/dL (ref 6.0–8.3)

## 2020-12-06 LAB — CBC
HCT: 47 % (ref 39.0–52.0)
Hemoglobin: 15 g/dL (ref 13.0–17.0)
MCHC: 31.9 g/dL (ref 30.0–36.0)
MCV: 86.8 fl (ref 78.0–100.0)
Platelets: 428 10*3/uL — ABNORMAL HIGH (ref 150.0–400.0)
RBC: 5.41 Mil/uL (ref 4.22–5.81)
RDW: 15.1 % (ref 11.5–15.5)
WBC: 9.4 10*3/uL (ref 4.0–10.5)

## 2020-12-06 MED ORDER — TRAMADOL HCL 50 MG PO TABS: 50.0000 mg | ORAL_TABLET | Freq: Three times a day (TID) | ORAL | 0 refills | Status: DC | PRN

## 2020-12-06 MED ORDER — DICYCLOMINE HCL 10 MG PO CAPS
ORAL_CAPSULE | ORAL | 0 refills | Status: DC
Start: 2020-12-06 — End: 2021-01-16

## 2020-12-06 NOTE — Patient Instructions (Addendum)
Give [place] 2-3 business days to get the results of your labs back.   If there is blood in your stool, we will refer you to a specialist. If there is not, we will set you up with a CT scan.  Do not drink alcohol, do any illicit/street drugs, drive or do anything that requires alertness while on this medicine. Try not to take with the Klonopin.   Let us know if you need anything.
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
## 2020-12-11 ENCOUNTER — Telehealth: Payer: Self-pay | Admitting: Pulmonary Disease

## 2020-12-11 DIAGNOSIS — G4733 Obstructive sleep apnea (adult) (pediatric): Secondary | ICD-10-CM

## 2020-12-11 NOTE — Telephone Encounter (Signed)
HST 12/02/20 >> AHI 63.1, SpO2 low 52%   Please inform him that his sleep study shows severe obstructive sleep apnea.  Please arrange for ROV with me or NP to discuss treatment options.

## 2020-12-12 NOTE — Telephone Encounter (Signed)
Spoke with patient regarding HST results. They verbalized understanding. No further questions. Patient has been scheduled for a follow up. Nothing further needed at this time.   Next Appt With Pulmonology Clayton Bibles, NP) 12/15/2020 at 11:00 AM

## 2020-12-15 ENCOUNTER — Ambulatory Visit: Payer: 59 | Admitting: Nurse Practitioner

## 2020-12-16 ENCOUNTER — Other Ambulatory Visit: Payer: Self-pay | Admitting: Family Medicine

## 2020-12-16 ENCOUNTER — Encounter: Payer: Self-pay | Admitting: Family Medicine

## 2020-12-16 DIAGNOSIS — R6884 Jaw pain: Secondary | ICD-10-CM

## 2020-12-19 ENCOUNTER — Encounter: Payer: Self-pay | Admitting: Family Medicine

## 2020-12-20 ENCOUNTER — Ambulatory Visit (INDEPENDENT_AMBULATORY_CARE_PROVIDER_SITE_OTHER): Payer: 59 | Admitting: Nurse Practitioner

## 2020-12-20 ENCOUNTER — Encounter: Payer: Self-pay | Admitting: Nurse Practitioner

## 2020-12-20 ENCOUNTER — Other Ambulatory Visit: Payer: Self-pay

## 2020-12-20 VITALS — BP 142/86 | HR 87 | Temp 97.9°F | Ht 76.0 in | Wt 277.2 lb

## 2020-12-20 DIAGNOSIS — K219 Gastro-esophageal reflux disease without esophagitis: Secondary | ICD-10-CM | POA: Diagnosis not present

## 2020-12-20 DIAGNOSIS — G4733 Obstructive sleep apnea (adult) (pediatric): Secondary | ICD-10-CM | POA: Diagnosis not present

## 2020-12-20 MED ORDER — FAMOTIDINE 20 MG PO TABS: 20.0000 mg | ORAL_TABLET | Freq: Two times a day (BID) | ORAL | 3 refills | Status: DC | PRN

## 2020-12-20 NOTE — Assessment & Plan Note (Signed)
Persistent daytime fatigue symptoms. AHI 63.1/hr. Will initiate CPAP therapy auto 5-20 cmH2O. Recommended mask fitting at [facility]. Follow up within 31-90 days of starting therapy.   Patient Instructions  Use CPAP auto 5-20 cmH2O every night, minimum of 4-6 hours a night.  Change equipment every 30 days or as directed by DME. Wash your tubing with warm soap and water daily, hang to dry. Wash humidifier portion weekly.  Maintain clean equipment, as directed by home health agency.  Be aware of reduced alertness and do not drive or operate heavy machinery if experiencing this or drowsiness.  Exercise encouraged, as tolerated. Healthy weight management discussed.  Avoid or decrease alcohol consumption and medications that make you more sleepy, if possible. Notify if persistent daytime sleepiness occurs even with consistent use of CPAP.   We discussed how untreated sleep apnea puts an individual at risk for cardiac arrhthymias, pulm HTN, DM, stroke and increases their risk for daytime accidents. We also briefly reviewed treatment options including weight loss, side sleeping position, oral appliance, CPAP therapy or referral to ENT for possible surgical options  Referral to [facility] for mask fitting. They will contact you for scheduling.   -Start pepcid 20 mg twice daily as needed for reflux/indigestion   Follow up in 3 months with Dr Halford Chessman, Roxan Diesel, NP or APP. If you have not been using CPAP for >31 days, reschedule appointment so that we follow up in 31-90 days after initiation of therapy. If symptoms do not improve or worsen, please contact office for sooner follow up or seek emergency care.

## 2020-12-20 NOTE — Progress Notes (Signed)
@Patient  ID: Kurt Mcdonald, male    DOB: July 27, 1965, 55 y.o.   MRN: [id number]  Chief Complaint  Patient presents with   Follow-up    HST f/u      Referring provider: Sharlene Dory*  HPI: 55 year old male, never smoker followed for obstructive sleep apnea. He is a patient of Dr. Evlyn Courier and was last seen in office 08/29/2020.  Past medical history significant for asthma, hypertension, chronic migraines, diverticulosis, IBS, nonalcoholic fatty liver disease, anxiety and depression, HLD, hypogonadism.  TEST/EVENTS:  05/06/2015 PSG: AHI 6, SPO2 low 78% 09/14/2015 PFTs: FEV1 5.73 (123%), FEV1 percent 85, TLC 9.45 (117%), DLCO 78% 04/30/2018 VQ scan: Normal 12/02/2020 HST: AHI 63.1, SPO2 low 52%.  Severe obstructive sleep apnea  08/29/2020: OV with Dr. Craige Cotta.  Previous sleep study in 2017 showed mild sleep apnea; initiated on CPAP therapy but had difficulties with mask fit and pressure setting so he stopped using it.  Worsening daytime fatigue symptoms and snoring since.  HST ordered  12/20/2020: Today - HST follow up Patient presents today for follow up after home sleep study indicating severe sleep apnea. He continues to experience persistent daytime fatigue symptoms and loud snoring at night. He denies drowsy driving, narcolepsy, or morning headaches. He previously was on CPAP therapy in 2017, but had difficulties with the mask and fit and stopped using it. He usually goes to sleep around 11 pm, falls asleep in a few minutes, and wakes around 7 am. He is open to retrying CPAP therapy.   His breathing is stable and he denies shortness of breath, cough, or wheezing. He is not on any maintenance inhalers for his asthma and does not require use of his rescue inhaler. He does experience some breakthrough GERD symptoms with a feeling of fullness/burning sensation at times. No odynophagia, dysphagia, hematochezia, anemia, or weight loss.   Allergies  Allergen Reactions   Butrans  [Buprenorphine] Other (See Comments)    Felt funny   Crestor [Rosuvastatin Calcium] Other (See Comments)    Severe leg cramping   Fluoxetine Other (See Comments)    GI Upset (intolerance), Headache   Duloxetine Nausea Only   Sulfa Antibiotics Rash    Immunization History  Administered Date(s) Administered   Influenza Split 11/02/2010, 10/02/2011, 02/02/2012   Influenza,inj,Quad PF,6+ Mos 09/04/2013, 11/10/2014, 08/09/2015   PFIZER(Purple Top)SARS-COV-2 Vaccination 08/20/2019, 09/10/2019   Tdap 05/20/2013   Zoster Recombinat (Shingrix) 04/11/2020, 09/21/2020    Past Medical History:  Diagnosis Date   Anxiety    Chicken pox    Chronic pain syndrome    Depression    Diverticulitis of colon without hemorrhage 06/04/2013   Diverticulosis of colon    Headache    History of pneumonia    Hypercholesteremia    Hypertension    Irritable bowel syndrome (IBS)    LBP (low back pain)    Male hypogonadism    NAFLD (nonalcoholic fatty liver disease)    Overweight(278.02)    Vision abnormalities     Tobacco History: Social History   Tobacco Use  Smoking Status Never  Smokeless Tobacco Never   Counseling given: Not Answered   Outpatient Medications Prior to Visit  Medication Sig Dispense Refill   clonazePAM (KLONOPIN) 1 MG tablet Take 0.5-1 tablets (0.5-1 mg total) by mouth 2 (two) times daily as needed. 60 tablet 5   dicyclomine (BENTYL) 10 MG capsule Take 1 tab every 6 hours as needed for abdominal cramping. 60 capsule 0   pantoprazole (PROTONIX) 40 MG  tablet TAKE 1 TABLET(40 MG) BY MOUTH DAILY 30 tablet 1   No facility-administered medications prior to visit.     Review of Systems:   Constitutional: No weight loss or gain, night sweats, fevers, chills. +daytime fatigue symptoms HEENT: No headaches, difficulty swallowing, tooth/dental problems, or sore throat. No sneezing, itching, ear ache, nasal congestion, or post nasal drip CV:  No chest pain, orthopnea, PND,  swelling in lower extremities, anasarca, dizziness, palpitations, syncope Resp: No shortness of breath with exertion or at rest. No excess mucus or change in color of mucus. No productive or non-productive. No hemoptysis. No wheezing.  No chest wall deformity GI:  +heartburn, indigestion. No abdominal pain, nausea, vomiting, diarrhea, change in bowel habits, loss of appetite, bloody stools.  GU: No dysuria, change in color of urine, urgency or frequency.  No flank pain, no hematuria  Skin: No rash, lesions, ulcerations MSK:  No joint pain or swelling.  No decreased range of motion.  No back pain. Neuro: No dizziness or lightheadedness.  Psych: No depression or anxiety. Mood stable. No narcolepsy.     Physical Exam:  BP (!) 142/86 (BP Location: Right Arm, Patient Position: Sitting, Cuff Size: Large)    Pulse 87    Temp 97.9 F (36.6 C) (Oral)    Ht 6\' 4"  (1.93 m)    Wt 277 lb 3.2 oz (125.7 kg)    SpO2 97%    BMI 33.74 kg/m   GEN: Pleasant, interactive, well-appearing; obese; in no acute distress HEENT:  Normocephalic and atraumatic. EACs patent bilaterally. TM pearly gray with present light reflex bilaterally. PERRLA. Sclera white. Nasal turbinates pink, moist and patent bilaterally. No rhinorrhea present. Oropharynx pink and moist, without exudate or edema. No lesions, ulcerations, or postnasal drip.  NECK:  Supple w/ fair ROM. No JVD present. Normal carotid impulses w/o bruits. Thyroid symmetrical with no goiter or nodules palpated. No lymphadenopathy.   CV: RRR, no m/r/g, no peripheral edema. Pulses intact, +2 bilaterally. No cyanosis, pallor or clubbing. PULMONARY:  Unlabored, regular breathing. Clear bilaterally A&P w/o wheezes/rales/rhonchi. No accessory muscle use. No dullness to percussion. GI: BS present and normoactive. Soft, non-tender to palpation. No organomegaly or masses detected. No CVA tenderness. MSK: No erythema, warmth or tenderness. Cap refil <2 sec all extrem. No  deformities or joint swelling noted.  Neuro: A/Ox3. No focal deficits noted.   Skin: Warm, no lesions or rashe Psych: Normal affect and behavior. Judgement and thought content appropriate.     Lab Results:  CBC    Component Value Date/Time   WBC 9.4 12/06/2020 0736   RBC 5.41 12/06/2020 0736   HGB 15.0 12/06/2020 0736   HCT 47.0 12/06/2020 0736   PLT 428.0 (H) 12/06/2020 0736   MCV 86.8 12/06/2020 0736   MCH 29.8 04/29/2020 1541   MCHC 31.9 12/06/2020 0736   RDW 15.1 12/06/2020 0736   LYMPHSABS 2.2 04/29/2020 1541   MONOABS 0.8 04/29/2020 1541   EOSABS 0.2 04/29/2020 1541   BASOSABS 0.1 04/29/2020 1541    BMET    Component Value Date/Time   NA 139 12/06/2020 0736   K 4.5 12/06/2020 0736   CL 102 12/06/2020 0736   CO2 27 12/06/2020 0736   GLUCOSE 102 (H) 12/06/2020 0736   BUN 14 12/06/2020 0736   CREATININE 1.28 12/06/2020 0736   CREATININE 1.31 05/21/2014 1521   CALCIUM 9.6 12/06/2020 0736   GFRNONAA 57 (L) 04/29/2020 1541   GFRAA >60 09/28/2019 1710    BNP No  results found for: BNP   Imaging:  CT Head Wo Contrast  Result Date: 11/24/2020 CLINICAL DATA:  Trauma peer EXAM: CT HEAD WITHOUT CONTRAST CT CERVICAL SPINE WITHOUT CONTRAST TECHNIQUE: Multidetector CT imaging of the head and cervical spine was performed following the standard protocol without intravenous contrast. Multiplanar CT image reconstructions of the cervical spine were also generated. COMPARISON:  None. FINDINGS: CT HEAD FINDINGS Brain: No evidence of large-territorial acute infarction. No parenchymal hemorrhage. No mass lesion. No extra-axial collection. No mass effect or midline shift. No hydrocephalus. Basilar cisterns are patent. Vascular: No hyperdense vessel. Skull: No acute fracture or focal lesion. Sinuses/Orbits: A mucosal polyp. Paranasal sinuses and mastoid air cells are clear. The orbits are unremarkable. Other: None. CT CERVICAL SPINE FINDINGS Alignment: Normal. Skull base and vertebrae:  Limited evaluation of the mid to lower cervical spine due to quantum mottle artifact. Multilevel mild degenerative changes of the spine. No acute fracture. No aggressive appearing focal osseous lesion or focal pathologic process. Soft tissues and spinal canal: No prevertebral fluid or swelling. No visible canal hematoma. Upper chest: Unremarkable. Other: None. IMPRESSION: 1. No acute intracranial abnormality. 2. No acute displaced fracture or traumatic listhesis of the cervical spine. Electronically Signed   By: Tish Frederickson M.D.   On: 11/24/2020 23:02   CT Cervical Spine Wo Contrast  Result Date: 11/24/2020 CLINICAL DATA:  Trauma peer EXAM: CT HEAD WITHOUT CONTRAST CT CERVICAL SPINE WITHOUT CONTRAST TECHNIQUE: Multidetector CT imaging of the head and cervical spine was performed following the standard protocol without intravenous contrast. Multiplanar CT image reconstructions of the cervical spine were also generated. COMPARISON:  None. FINDINGS: CT HEAD FINDINGS Brain: No evidence of large-territorial acute infarction. No parenchymal hemorrhage. No mass lesion. No extra-axial collection. No mass effect or midline shift. No hydrocephalus. Basilar cisterns are patent. Vascular: No hyperdense vessel. Skull: No acute fracture or focal lesion. Sinuses/Orbits: A mucosal polyp. Paranasal sinuses and mastoid air cells are clear. The orbits are unremarkable. Other: None. CT CERVICAL SPINE FINDINGS Alignment: Normal. Skull base and vertebrae: Limited evaluation of the mid to lower cervical spine due to quantum mottle artifact. Multilevel mild degenerative changes of the spine. No acute fracture. No aggressive appearing focal osseous lesion or focal pathologic process. Soft tissues and spinal canal: No prevertebral fluid or swelling. No visible canal hematoma. Upper chest: Unremarkable. Other: None. IMPRESSION: 1. No acute intracranial abnormality. 2. No acute displaced fracture or traumatic listhesis of the cervical  spine. Electronically Signed   By: Tish Frederickson M.D.   On: 11/24/2020 23:02      PFT Results Latest Ref Rng & Units 09/14/2015  FVC-Pre L 6.40  FVC-Predicted Pre % 106  FVC-Post L 6.77  FVC-Predicted Post % 112  Pre FEV1/FVC % % 82  Post FEV1/FCV % % 85  FEV1-Pre L 5.27  FEV1-Predicted Pre % 113  FEV1-Post L 5.73  DLCO uncorrected ml/min/mmHg 31.34  DLCO [facility]C% % 78  DLVA Predicted % 72  TLC L 9.45  TLC % Predicted % 117  RV % Predicted % 121    No results found for: NITRICOXIDE      Assessment & Plan:   Severe obstructive sleep apnea-hypopnea syndrome Persistent daytime fatigue symptoms. AHI 63.1/hr. Will initiate CPAP therapy auto 5-20 cmH2O. Recommended mask fitting at [facility]. Follow up within 31-90 days of starting therapy.   Patient Instructions  Use CPAP auto 5-20 cmH2O every night, minimum of 4-6 hours a night.  Change equipment every 30 days or as directed  by DME. Wash your tubing with warm soap and water daily, hang to dry. Wash humidifier portion weekly.  Maintain clean equipment, as directed by home health agency.  Be aware of reduced alertness and do not drive or operate heavy machinery if experiencing this or drowsiness.  Exercise encouraged, as tolerated. Healthy weight management discussed.  Avoid or decrease alcohol consumption and medications that make you more sleepy, if possible. Notify if persistent daytime sleepiness occurs even with consistent use of CPAP.   We discussed how untreated sleep apnea puts an individual at risk for cardiac arrhthymias, pulm HTN, DM, stroke and increases their risk for daytime accidents. We also briefly reviewed treatment options including weight loss, side sleeping position, oral appliance, CPAP therapy or referral to ENT for possible surgical options  Referral to [facility] for mask fitting. They will contact you for scheduling.   -Start pepcid 20 mg twice daily as needed for reflux/indigestion   Follow up in 3  months with Dr Craige Cotta, Rhunette Croft, NP or APP. If you have not been using CPAP for >31 days, reschedule appointment so that we follow up in 31-90 days after initiation of therapy. If symptoms do not improve or worsen, please contact office for sooner follow up or seek emergency care.    GERD (gastroesophageal reflux disease) Continue Protonix 40 mg daily. Rx pepcid 20 mg Twice daily as needed for breakthrough GERD symptoms. Follow up with PCP if symptoms persist. See above.    Noemi Chapel, NP 12/20/2020  Pt aware and understands NP's role.

## 2020-12-20 NOTE — Progress Notes (Signed)
Reviewed and agree with assessment/plan.   Chesley Mires, MD [facility] Pulmonary/Critical Care 12/20/2020, 11:55 AM Pager:  [phone]

## 2020-12-20 NOTE — Patient Instructions (Signed)
Use CPAP auto 5-20 cmH2O every night, minimum of 4-6 hours a night.  Change equipment every 30 days or as directed by DME. Wash your tubing with warm soap and water daily, hang to dry. Wash humidifier portion weekly.  Maintain clean equipment, as directed by home health agency.  Be aware of reduced alertness and do not drive or operate heavy machinery if experiencing this or drowsiness.  Exercise encouraged, as tolerated. Healthy weight management discussed.  Avoid or decrease alcohol consumption and medications that make you more sleepy, if possible. Notify if persistent daytime sleepiness occurs even with consistent use of CPAP.   We discussed how untreated sleep apnea puts an individual at risk for cardiac arrhthymias, pulm HTN, DM, stroke and increases their risk for daytime accidents. We also briefly reviewed treatment options including weight loss, side sleeping position, oral appliance, CPAP therapy or referral to ENT for possible surgical options  Referral to [facility] for mask fitting. They will contact you for scheduling.   -Start pepcid 20 mg twice daily as needed for reflux/indigestion   Follow up in 3 months with Kurt Mcdonald, Kurt Diesel, NP or APP. If you have not been using CPAP for >31 days, reschedule appointment so that we follow up in 31-90 days after initiation of therapy. If symptoms do not improve or worsen, please contact office for sooner follow up or seek emergency care.

## 2020-12-20 NOTE — Assessment & Plan Note (Signed)
Continue Protonix 40 mg daily. Rx pepcid 20 mg Twice daily as needed for breakthrough GERD symptoms. Follow up with PCP if symptoms persist. See above.

## 2021-01-09 ENCOUNTER — Ambulatory Visit: Payer: Self-pay | Admitting: Family Medicine

## 2021-01-13 ENCOUNTER — Other Ambulatory Visit: Payer: Self-pay | Admitting: Family Medicine

## 2021-01-13 DIAGNOSIS — F411 Generalized anxiety disorder: Secondary | ICD-10-CM

## 2021-01-13 DIAGNOSIS — R1030 Lower abdominal pain, unspecified: Secondary | ICD-10-CM

## 2021-01-16 NOTE — Telephone Encounter (Signed)
Requesting: clonazepam 1mg   Contract: 05/04/2016 UDS: None Last Visit: 12/06/2020 Next Visit: None Last Refill: 07/01/2020 #60 and 5RF  Refill also for tramadol- no longer on med list.   Please Advise

## 2021-01-17 ENCOUNTER — Other Ambulatory Visit (HOSPITAL_BASED_OUTPATIENT_CLINIC_OR_DEPARTMENT_OTHER): Payer: 59 | Admitting: Internal Medicine

## 2021-01-31 ENCOUNTER — Ambulatory Visit: Payer: Managed Care, Other (non HMO) | Admitting: Family Medicine

## 2021-01-31 ENCOUNTER — Other Ambulatory Visit: Payer: Self-pay | Admitting: Family Medicine

## 2021-01-31 ENCOUNTER — Encounter: Payer: Self-pay | Admitting: Family Medicine

## 2021-01-31 VITALS — BP 138/90 | HR 94 | Temp 98.1°F | Ht 76.0 in | Wt 278.2 lb

## 2021-01-31 DIAGNOSIS — R1319 Other dysphagia: Secondary | ICD-10-CM | POA: Diagnosis not present

## 2021-01-31 DIAGNOSIS — J40 Bronchitis, not specified as acute or chronic: Secondary | ICD-10-CM | POA: Diagnosis not present

## 2021-01-31 MED ORDER — DOXYCYCLINE HYCLATE 100 MG PO TABS: 100.0000 mg | ORAL_TABLET | Freq: Two times a day (BID) | ORAL | 0 refills | Status: AC

## 2021-01-31 MED ORDER — BENZONATATE 100 MG PO CAPS: 100.0000 mg | ORAL_CAPSULE | Freq: Three times a day (TID) | ORAL | 0 refills | Status: DC | PRN

## 2021-01-31 MED ORDER — PROMETHAZINE-DM 6.25-15 MG/5ML PO SYRP: 5.0000 mL | ORAL_SOLUTION | Freq: Every evening | ORAL | 0 refills | Status: DC | PRN

## 2021-01-31 MED ORDER — PREDNISONE 20 MG PO TABS
40.0000 mg | ORAL_TABLET | Freq: Every day | ORAL | 0 refills | Status: AC
Start: 2021-01-31 — End: 2021-02-05

## 2021-01-31 MED ORDER — PANTOPRAZOLE SODIUM 40 MG PO TBEC: DELAYED_RELEASE_TABLET | ORAL | 1 refills | Status: DC

## 2021-01-31 MED ORDER — SILDENAFIL CITRATE 100 MG PO TABS: 50.0000 mg | ORAL_TABLET | Freq: Every day | ORAL | 11 refills | Status: DC | PRN

## 2021-01-31 NOTE — Progress Notes (Signed)
Chief Complaint  Patient presents with   Cough   Sinus Problem    For 10 days    Kurt Mcdonald here for URI complaints.  Duration: 10 days  Associated symptoms: sinus congestion, sinus pain, rhinorrhea, itchy watery eyes, wheezing, shortness of breath, and coughing Denies: ear pain, ear drainage, sore throat, myalgia, and fevers, N/V/D Treatment to date: Claritin D, Tylenol, Advil, nasal spray Sick contacts: No Tested neg for covid.   Past Medical History:  Diagnosis Date   Anxiety    Chicken pox    Chronic pain syndrome    Depression    Diverticulitis of colon without hemorrhage 06/04/2013   Diverticulosis of colon    Headache    History of pneumonia    Hypercholesteremia    Hypertension    Irritable bowel syndrome (IBS)    LBP (low back pain)    Male hypogonadism    NAFLD (nonalcoholic fatty liver disease)    Overweight(278.02)    Vision abnormalities     Objective BP 138/90    Pulse 94    Temp 98.1 F (36.7 C) (Oral)    Ht 6\' 4"  (1.93 m)    Wt 278 lb 4 oz (126.2 kg)    SpO2 96%    BMI 33.87 kg/m  General: Awake, alert, appears stated age HEENT: AT, [place], ears patent b/l and TM's neg, nares patent w/o discharge, pharynx pink and without exudates, MMM Neck: No masses or asymmetry Heart: RRR Lungs: +exp wheezes throughout, no accessory muscle use Psych: Age appropriate judgment and insight, normal mood and affect  Wheezy bronchitis - Plan: doxycycline (VIBRA-TABS) 100 MG tablet, predniSONE (DELTASONE) 20 MG tablet, benzonatate (TESSALON) 100 MG capsule, promethazine-dextromethorphan (PROMETHAZINE-DM) 6.25-15 MG/5ML syrup  Esophageal dysphagia - Plan: pantoprazole (PROTONIX) 40 MG tablet  5 d pred burst, 40 mg/d. Tessalon Perles during day. Syrup at night. Warnings about syrup verbalized. Doxy for 7 d if no better in 2-3 d. Continue to push fluids, practice good hand hygiene, cover mouth when coughing. F/u prn. If starting to experience fevers, shaking, or shortness  of breath, seek immediate care. Pt voiced understanding and agreement to the plan.  [place], DO 01/31/21 11:32 AM

## 2021-01-31 NOTE — Patient Instructions (Addendum)
Continue to push fluids, practice good hand hygiene, and cover your mouth if you cough.  If you start having fevers, shaking or shortness of breath, seek immediate care.  OK to take Tylenol 1000 mg (2 extra strength tabs) or 975 mg (3 regular strength tabs) every 6 hours as needed.  Wait 2-3 days prior to taking the doxycycline and only take if no better.  Take the syrup at night as it can make you drowsy.  The pills can be taken during the day and won't make you drowsy. You can take both cough medicines at the same time.   Let us know if you need anything.

## 2021-03-21 ENCOUNTER — Ambulatory Visit: Payer: 59 | Admitting: Nurse Practitioner

## 2021-04-04 ENCOUNTER — Encounter: Payer: Self-pay | Admitting: Family Medicine

## 2021-04-06 ENCOUNTER — Ambulatory Visit: Payer: 59 | Admitting: Nurse Practitioner

## 2021-04-06 NOTE — Telephone Encounter (Signed)
Pt called and he has an appt with Caleen Jobs Monday ?

## 2021-04-06 NOTE — Telephone Encounter (Signed)
Pt is scheduled Wednesday. If there are any openings sooner he asked to please let him know. Stated he is going through a very hard time.  ?

## 2021-04-10 ENCOUNTER — Ambulatory Visit (INDEPENDENT_AMBULATORY_CARE_PROVIDER_SITE_OTHER): Payer: Managed Care, Other (non HMO) | Admitting: Family Medicine

## 2021-04-10 ENCOUNTER — Encounter: Payer: Self-pay | Admitting: Family Medicine

## 2021-04-10 VITALS — BP 168/98 | HR 81 | Ht 76.0 in | Wt 276.8 lb

## 2021-04-10 DIAGNOSIS — R030 Elevated blood-pressure reading, without diagnosis of hypertension: Secondary | ICD-10-CM | POA: Diagnosis not present

## 2021-04-10 DIAGNOSIS — F419 Anxiety disorder, unspecified: Secondary | ICD-10-CM

## 2021-04-10 MED ORDER — SERTRALINE HCL 50 MG PO TABS: 50.0000 mg | ORAL_TABLET | Freq: Every day | ORAL | 3 refills | Status: DC

## 2021-04-10 MED ORDER — HYDROXYZINE PAMOATE 25 MG PO CAPS: 25.0000 mg | ORAL_CAPSULE | Freq: Three times a day (TID) | ORAL | 0 refills | Status: DC | PRN

## 2021-04-10 NOTE — Progress Notes (Signed)
? ?Acute Office Visit ? ?Subjective:  ? ? Patient ID: Kurt Mcdonald, male    DOB: 1965/11/28, 56 y.o.   MRN: [id number] ? ?CC: stress/anxiety  ? ?HPI ?Patient is in today for stress/anxiety ? ?Patient reports he has been under a lot of stress/anxiety lately.  States his daughter is getting married soon, his brother with cancer just received a 7-month prognosis, wife was recently in a car accident (she is okay with the car is not), needs has had car problems.  States he is just very stressed overall.  He has been taking his Klonopin twice a day and it seems to be helping temporarily.  He denies any suicidal/homicidal ideation.  States he does not have time for counseling but would like to consider additional medications at this time.  He mentions Xanax, but discussed that this is not an option as he is already on Klonopin.  Reports he has tried multiple medications (Cymbalta, Lexapro, Prozac, Effexor ) for depression in the past which she did not find helpful, but he has never had anxiety like this before he is willing to try other SSRIs. ? ? ? ? ?Past Medical History:  ?Diagnosis Date  ? Anxiety   ? Chicken pox   ? Chronic pain syndrome   ? Depression   ? Diverticulitis of colon without hemorrhage 06/04/2013  ? Diverticulosis of colon   ? Headache   ? History of pneumonia   ? Hypercholesteremia   ? Hypertension   ? Irritable bowel syndrome (IBS)   ? LBP (low back pain)   ? Male hypogonadism   ? NAFLD (nonalcoholic fatty liver disease)   ? Overweight(278.02)   ? Vision abnormalities   ? ? ?Past Surgical History:  ?Procedure Laterality Date  ? shoulder sugery  01/2010  ? right  ? TOOTH EXTRACTION  11/2011  ? WISDOM TOOTH EXTRACTION    ? ? ?Family History  ?Problem Relation Age of Onset  ? Healthy Mother   ?     Living  ? Pneumonia Father 37  ?     Deceased  ? Bone cancer Paternal Grandfather   ? Diabetes Maternal Grandfather   ? Healthy Brother   ? Healthy Son   ?     x1  ? Healthy Daughter   ?     x2  ? ? ?Social  History  ? ?Socioeconomic History  ? Marital status: Married  ?  Spouse name: Elnita Maxwell  ? Number of children: 3  ? Years of education: Not on file  ? Highest education level: Not on file  ?Occupational History  ? Occupation: purchasing  ?  Employer: [employer]  ?Tobacco Use  ? Smoking status: Never  ? Smokeless tobacco: Never  ?Vaping Use  ? Vaping Use: Never used  ?Substance and Sexual Activity  ? Alcohol use: Yes  ?  Alcohol/week: 0.0 standard drinks  ?  Comment: Rare  ? Drug use: No  ? Sexual activity: Not on file  ?Other Topics Concern  ? Not on file  ?Social History Narrative  ? Not on file  ? ?Social Determinants of Health  ? ?Financial Resource Strain: Not on file  ?Food Insecurity: Not on file  ?Transportation Needs: Not on file  ?Physical Activity: Not on file  ?Stress: Not on file  ?Social Connections: Not on file  ?Intimate Partner Violence: Not on file  ? ? ?Outpatient Medications Prior to Visit  ?Medication Sig Dispense Refill  ? clonazePAM (KLONOPIN) 1 MG tablet  TAKE 1/2 TO 1 TABLET(0.5 TO 1 MG) BY MOUTH TWICE DAILY AS NEEDED 60 tablet 2  ? dicyclomine (BENTYL) 10 MG capsule TAKE 1 CAPSULE BY MOUTH EVERY 6 HOURS AS NEEDED FOR ABDOMINAL CRAMPS 60 capsule 0  ? famotidine (PEPCID) 20 MG tablet Take 1 tablet (20 mg total) by mouth 2 (two) times daily as needed for heartburn or indigestion. 60 tablet 3  ? pantoprazole (PROTONIX) 40 MG tablet TAKE 1 TABLET(40 MG) BY MOUTH DAILY 90 tablet 1  ? sildenafil (VIAGRA) 100 MG tablet Take 0.5-1 tablets (50-100 mg total) by mouth daily as needed for erectile dysfunction. 5 tablet 11  ? traMADol (ULTRAM) 50 MG tablet TAKE 1 TABLET(50 MG) BY MOUTH EVERY 8 HOURS FOR UP TO 5 DAYS AS NEEDED 20 tablet 0  ? benzonatate (TESSALON) 100 MG capsule Take 1 capsule (100 mg total) by mouth 3 (three) times daily as needed. 30 capsule 0  ? promethazine-dextromethorphan (PROMETHAZINE-DM) 6.25-15 MG/5ML syrup Take 5 mLs by mouth at bedtime as needed for cough. 118 mL 0  ? ?No  facility-administered medications prior to visit.  ? ? ?Allergies  ?Allergen Reactions  ? Butrans [Buprenorphine] Other (See Comments)  ?  Felt funny  ? Crestor [Rosuvastatin Calcium] Other (See Comments)  ?  Severe leg cramping  ? Fluoxetine Other (See Comments)  ?  GI Upset (intolerance), Headache  ? Duloxetine Nausea Only  ? Sulfa Antibiotics Rash  ? ? ?Review of Systems ?All review of systems negative except what is listed in the HPI ? ?   ?Objective:  ?  ?Physical Exam ?Vitals reviewed.  ?Constitutional:   ?   Appearance: Normal appearance. He is obese.  ?Cardiovascular:  ?   Rate and Rhythm: Normal rate and regular rhythm.  ?Pulmonary:  ?   Effort: Pulmonary effort is normal.  ?   Breath sounds: Normal breath sounds.  ?Musculoskeletal:  ?   Cervical back: Normal range of motion and neck supple.  ?Skin: ?   General: Skin is warm and dry.  ?Neurological:  ?   General: No focal deficit present.  ?   Mental Status: He is alert and oriented to person, place, and time. Mental status is at baseline.  ?Psychiatric:     ?   Mood and Affect: Mood normal.     ?   Behavior: Behavior normal.     ?   Thought Content: Thought content normal.     ?   Judgment: Judgment normal.  ? ? ?BP (!) 164/105   Pulse 81   Ht 6\' 4"  (1.93 m)   Wt 276 lb 12.8 oz (125.6 kg)   BMI 33.69 kg/m?  ?Wt Readings from Last 3 Encounters:  ?04/10/21 276 lb 12.8 oz (125.6 kg)  ?01/31/21 278 lb 4 oz (126.2 kg)  ?12/20/20 277 lb 3.2 oz (125.7 kg)  ? ? ?Health Maintenance Due  ?Topic Date Due  ? COVID-19 Vaccine (3 - Booster for Pfizer series) 11/05/2019  ? ? ?There are no preventive care reminders to display for this patient. ? ? ?Lab Results  ?Component Value Date  ? TSH 1.98 05/04/2016  ? ?Lab Results  ?Component Value Date  ? WBC 9.4 12/06/2020  ? HGB 15.0 12/06/2020  ? HCT 47.0 12/06/2020  ? MCV 86.8 12/06/2020  ? PLT 428.0 (H) 12/06/2020  ? ?Lab Results  ?Component Value Date  ? NA 139 12/06/2020  ? K 4.5 12/06/2020  ? CO2 27 12/06/2020  ?  GLUCOSE 102 (H) 12/06/2020  ?  BUN 14 12/06/2020  ? CREATININE 1.28 12/06/2020  ? BILITOT 0.8 12/06/2020  ? ALKPHOS 51 12/06/2020  ? AST 17 12/06/2020  ? ALT 19 12/06/2020  ? PROT 6.7 12/06/2020  ? ALBUMIN 4.1 12/06/2020  ? CALCIUM 9.6 12/06/2020  ? ANIONGAP 6 04/29/2020  ? GFR 63.03 12/06/2020  ? ?Lab Results  ?Component Value Date  ? CHOL 183 05/11/2020  ? ?Lab Results  ?Component Value Date  ? HDL 30.80 (L) 05/11/2020  ? ?Lab Results  ?Component Value Date  ? LDLCALC 131 (H) 05/11/2020  ? ?Lab Results  ?Component Value Date  ? TRIG 107.0 05/11/2020  ? ?Lab Results  ?Component Value Date  ? CHOLHDL 6 05/11/2020  ? ?No results found for: HGBA1C ? ?   ?Assessment & Plan:  ? ?1. Anxiety ?He has failed several other medications when he was taking them for depression. He is willing to try Zoloft (aware of possible side effects - handout provided).  He suggested adding Xanax, but discussed that this would not be an option since he is already on Klonopin.  We will try adding PRN hydroxyzine (education provided). He deferred counseling/psych referral at this time. If not improving after consistent trial of Zoloft recommend referral to psych or consider genesight testing. No SI/HI.  ? ?Taking the medicine as directed and not missing any doses is one of the best things you can do to treat your anxiety.  Here are some things to keep in mind: ?Side effects (stomach upset, some increased anxiety) may happen before you notice a benefit.  These side effects typically go away over time. ?Changes to your dose of medicine or a change in medication all together is sometimes necessary ?Many people will notice an improvement within two weeks but the full effect of the medication can take up to 4-6 weeks ?Stopping the medication when you start feeling better often results in a return of symptoms. Most people need to be on medication at least 6-12 months ?If you start having thoughts of hurting yourself or others after starting this  medicine, please call me immediately.   ? ?- sertraline (ZOLOFT) 50 MG tablet; Take 1 tablet (50 mg total) by mouth daily.  Dispense: 30 tablet; Refill: 3 ?- hydrOXYzine (VISTARIL) 25 MG capsule; Take 1 capsule (25 mg total) by

## 2021-04-10 NOTE — Patient Instructions (Addendum)
Taking the medicine as directed and not missing any doses is one of the best things you can do to treat your anxiety.  Here are some things to keep in mind: ?Side effects (stomach upset, some increased anxiety) may happen before you notice a benefit.  These side effects typically go away over time. ?Changes to your dose of medicine or a change in medication all together is sometimes necessary ?Many people will notice an improvement within two weeks but the full effect of the medication can take up to 4-6 weeks ?Stopping the medication when you start feeling better often results in a return of symptoms. Most people need to be on medication at least 6-12 months ?If you start having thoughts of hurting yourself or others after starting this medicine, please call me immediately.   ? ? ? ?Blood pressure is not at goal for age and co-morbidities.  I recommend monitoring for 2 weeks at home.   ?- BP goal <130/80 ?- monitor and log blood pressures at home ?- check around the same time each day in a relaxed setting ?- Limit salt to <2000 mg/day ?- Follow DASH eating plan (heart healthy diet) ?- limit alcohol to 2 standard drinks per day for men and 1 per day for women ?- avoid tobacco products ?- get at least 2 hours of regular aerobic exercise weekly ?Patient aware of signs/symptoms requiring further/urgent evaluation. ?Nurse visit in 2 weeks ? ?

## 2021-04-12 ENCOUNTER — Ambulatory Visit: Payer: Managed Care, Other (non HMO) | Admitting: Family Medicine

## 2021-04-30 ENCOUNTER — Other Ambulatory Visit: Payer: Self-pay | Admitting: Family Medicine

## 2021-04-30 DIAGNOSIS — F411 Generalized anxiety disorder: Secondary | ICD-10-CM

## 2021-05-01 NOTE — Telephone Encounter (Signed)
Last OV--01/31/2021 ?Last RF--#60 with 2 refills on 01/16/2021 ?

## 2021-05-02 ENCOUNTER — Ambulatory Visit: Payer: Managed Care, Other (non HMO)

## 2021-05-03 ENCOUNTER — Emergency Department (HOSPITAL_COMMUNITY)
Admission: EM | Admit: 2021-05-03 | Discharge: 2021-05-04 | Discharge status: Against Medical Advice | Payer: Commercial Managed Care - HMO | Attending: Emergency Medicine | Admitting: Emergency Medicine

## 2021-05-03 ENCOUNTER — Encounter: Payer: Self-pay | Admitting: Family Medicine

## 2021-05-03 ENCOUNTER — Emergency Department (HOSPITAL_COMMUNITY): Payer: Commercial Managed Care - HMO

## 2021-05-03 ENCOUNTER — Other Ambulatory Visit: Payer: Self-pay

## 2021-05-03 ENCOUNTER — Encounter (HOSPITAL_COMMUNITY): Payer: Self-pay | Admitting: *Deleted

## 2021-05-03 DIAGNOSIS — R0789 Other chest pain: Secondary | ICD-10-CM | POA: Insufficient documentation

## 2021-05-03 DIAGNOSIS — R42 Dizziness and giddiness: Secondary | ICD-10-CM | POA: Diagnosis not present

## 2021-05-03 DIAGNOSIS — R0602 Shortness of breath: Secondary | ICD-10-CM | POA: Diagnosis not present

## 2021-05-03 DIAGNOSIS — Z5321 Procedure and treatment not carried out due to patient leaving prior to being seen by health care provider: Secondary | ICD-10-CM | POA: Insufficient documentation

## 2021-05-03 DIAGNOSIS — R519 Headache, unspecified: Secondary | ICD-10-CM | POA: Insufficient documentation

## 2021-05-03 LAB — CBC
HCT: 49.8 % (ref 39.0–52.0)
Hemoglobin: 16.2 g/dL (ref 13.0–17.0)
MCH: 28.4 pg (ref 26.0–34.0)
MCHC: 32.5 g/dL (ref 30.0–36.0)
MCV: 87.4 fL (ref 80.0–100.0)
Platelets: 381 10*3/uL (ref 150–400)
RBC: 5.7 MIL/uL (ref 4.22–5.81)
RDW: 14.8 % (ref 11.5–15.5)
WBC: 9.3 10*3/uL (ref 4.0–10.5)
nRBC: 0 % (ref 0.0–0.2)

## 2021-05-03 LAB — BASIC METABOLIC PANEL
Anion gap: 10 (ref 5–15)
BUN: 13 mg/dL (ref 6–20)
CO2: 21 mmol/L — ABNORMAL LOW (ref 22–32)
Calcium: 9.2 mg/dL (ref 8.9–10.3)
Chloride: 107 mmol/L (ref 98–111)
Creatinine, Ser: 1.28 mg/dL — ABNORMAL HIGH (ref 0.61–1.24)
GFR, Estimated: >60 mL/min (ref >60)
Glucose, Bld: 87 mg/dL (ref 70–99)
Potassium: 3.7 mmol/L (ref 3.5–5.1)
Sodium: 138 mmol/L (ref 135–145)

## 2021-05-03 LAB — TROPONIN I (HIGH SENSITIVITY): Troponin I (High Sensitivity): 6 ng/L (ref <18)

## 2021-05-03 NOTE — ED Triage Notes (Signed)
The pt is c/o chest pain for 3-4 days with some sob and a headache  no hx of the same ?

## 2021-05-03 NOTE — ED Provider Triage Note (Signed)
Emergency Medicine Provider Triage Evaluation Note ? ?Kurt Mcdonald , a 56 y.o. male  was evaluated in triage.  Pt complains of chest pain which has been ongoing for 3 days.  Patient complains of intermittent headache throughout those 3 days.  Patient states that today he also has experienced shortness of breath and a feeling of lightheadedness.  Patient states the chest pain is in the center of his chest.  Denies radiation of symptoms.  Currently rates a 6 out of 10 but states that it has been higher.  Described as tightness.  The patient states that he has felt some ringing of his ears today.  Denies abdominal pain, nausea, vomiting. ? ?Review of Systems  ?Positive: Chest pain, shortness of breath, lightheadedness, headache ?Negative: Abdominal pain, nausea, vomiting ? ?Physical Exam  ?BP (!) 164/109 (BP Location: Right Arm)   Pulse 90   Temp 98.4 ?F (36.9 ?C) (Oral)   Resp 18   Ht '6\' 4"'$  (1.93 m)   Wt 125.6 kg   SpO2 97%   BMI 33.71 kg/m?  ?Gen:   Awake, no distress   ?Resp:  Normal effort  ?MSK:   Moves extremities without difficulty  ?Other:   ? ?Medical Decision Making  ?Medically screening exam initiated at 8:36 PM.  Appropriate orders placed.  ASTOR GENTLE was informed that the remainder of the evaluation will be completed by another provider, this initial triage assessment does not replace that evaluation, and the importance of remaining in the ED until their evaluation is complete. ? ? ?  ?Dorothyann Peng, PA-C ?05/03/21 2039 ? ?

## 2021-05-04 ENCOUNTER — Encounter: Payer: Self-pay | Admitting: Family Medicine

## 2021-05-04 LAB — TROPONIN I (HIGH SENSITIVITY): Troponin I (High Sensitivity): 7 ng/L (ref <18)

## 2021-05-04 NOTE — ED Notes (Signed)
Name called for vital signs and no answer x1 ?

## 2021-05-04 NOTE — ED Notes (Signed)
Name called for vitals and no answer x3 ?

## 2021-05-09 ENCOUNTER — Ambulatory Visit: Payer: Commercial Managed Care - HMO | Admitting: Family Medicine

## 2021-05-09 ENCOUNTER — Ambulatory Visit: Payer: Commercial Managed Care - HMO

## 2021-05-09 NOTE — Progress Notes (Deleted)
BP 168/98 Important   Pulse 81

## 2021-05-10 ENCOUNTER — Ambulatory Visit (INDEPENDENT_AMBULATORY_CARE_PROVIDER_SITE_OTHER): Payer: Commercial Managed Care - HMO | Admitting: Family Medicine

## 2021-05-10 ENCOUNTER — Encounter: Payer: Self-pay | Admitting: Family Medicine

## 2021-05-10 VITALS — BP 124/84 | HR 68 | Temp 97.7°F | Ht 76.0 in | Wt 267.4 lb

## 2021-05-10 DIAGNOSIS — I10 Essential (primary) hypertension: Secondary | ICD-10-CM | POA: Diagnosis not present

## 2021-05-10 DIAGNOSIS — F411 Generalized anxiety disorder: Secondary | ICD-10-CM

## 2021-05-10 MED ORDER — CITALOPRAM HYDROBROMIDE 20 MG PO TABS: 20.0000 mg | ORAL_TABLET | Freq: Every day | ORAL | 3 refills | Status: DC

## 2021-05-10 MED ORDER — ALPRAZOLAM 0.5 MG PO TABS: 0.2500 mg | ORAL_TABLET | Freq: Two times a day (BID) | ORAL | 1 refills | Status: DC | PRN

## 2021-05-10 NOTE — Patient Instructions (Signed)
Aim to do some physical exertion for 150 minutes per week. This is typically divided into 5 days per week, 30 minutes per day. The activity should be enough to get your heart rate up. Anything is better than nothing if you have time constraints.  Please consider counseling. Contact [phone] to schedule an appointment or inquire about cost/insurance coverage.  [facility] located at [address], [place], [place].  Phone number = [phone].  Dr. Onoriode Edeh - Adult Psychiatry.    [facility] located at [address], [place], [place]. Phone number = [phone].   [facility] located at [address], [place], [place].  Phone number = [phone].   [facility] located at [address], [place], .  Phone number = [phone].  Let us know if you need anything.  

## 2021-05-10 NOTE — Progress Notes (Signed)
Chief Complaint  ?Patient presents with  ? Follow-up  ? Anxiety  ?  Brother just passed away  ? ? ?Subjective ?Kurt Mcdonald is a 56 y.o. male who presents for hypertension follow up. ?He does monitor home blood pressures. ?Blood pressures ranging from 130-140's/80-90's on average. ?He is not on any medication. ?Anxiety contributes his high readings.  ?He is usually adhering to a healthy diet overall. ?Current exercise: none ?No CP or SOB.  ? ?GAD/bereavement ?Started on Zoloft 50 mg/d.  He took it for few weeks and then stopped because it did not help.  He takes Klonopin for recent panic attacks/anxiety exacerbations which does not particularly help but does make him sleepy.  He took a friend's Xanax 0.25 mg that did help.  He is requesting a prescription of that rather than Klonopin.  He is not currently following with a counselor or psychologist.  No homicidal or suicidal ideation.  No self-medication.  He is not exercising routinely.  Unfortunately, his younger brother recently passed away due to a fall and terminal cancer.  This has significantly increased his anxiety levels. ?  ?Past Medical History:  ?Diagnosis Date  ? Anxiety   ? Chicken pox   ? Chronic pain syndrome   ? Depression   ? Diverticulitis of colon without hemorrhage 06/04/2013  ? Diverticulosis of colon   ? Headache   ? History of pneumonia   ? Hypercholesteremia   ? Hypertension   ? Irritable bowel syndrome (IBS)   ? LBP (low back pain)   ? Male hypogonadism   ? NAFLD (nonalcoholic fatty liver disease)   ? Overweight(278.02)   ? Vision abnormalities   ? ? ?Exam ?BP 124/84   Pulse 68   Temp 97.7 ?F (36.5 ?C) (Oral)   Ht '6\' 4"'$  (1.93 m)   Wt 267 lb 6 oz (121.3 kg)   SpO2 96%   BMI 32.55 kg/m?  ?General:  well developed, well nourished, in no apparent distress ?Heart: RRR, no bruits, no LE edema ?Lungs: clear to auscultation, no accessory muscle use ?Psych: well oriented with normal range of affect and appropriate  judgment/insight ? ?Essential hypertension ? ?GAD (generalized anxiety disorder) - Plan: ALPRAZolam (XANAX) 0.5 MG tablet, citalopram (CELEXA) 20 MG tablet ? ?Chronic, stable.  Continue diet control.  Counseled on diet and exercise.  Given his elevated home readings secondary to #2, will hold off on adding any blood pressure medication.  He will continue to monitor home readings. ?Chronic, unstable.  Exacerbation due to bereavement.  Counseling information provided.  Counseled on exercise, recommended he go outside as well.  Start Celexa 10 mg daily for 2 weeks and then increase to 20 mg daily.  Change Klonopin to Xanax 0.25-0.5 mg twice daily as needed. F/u in 1 month to recheck. ?The patient voiced understanding and agreement to the plan. ? ?Shelda Pal, DO ?05/10/21  ?12:02 PM ? ?

## 2021-11-17 ENCOUNTER — Telehealth: Payer: Self-pay | Admitting: Family Medicine

## 2021-11-17 NOTE — Telephone Encounter (Signed)
Pt called and offered appt in other [facility] office due to ours being fully booked. Pt opted to go to [facility].

## 2021-11-17 NOTE — Telephone Encounter (Signed)
Pt called stating that he has been having diarrhea for the past few days and was wondering if Dr. Nani Ravens could prescribe him an antibiotic. Pt stated that he has had this issue addressed with Dr. Nani Ravens before and he should be able to prescribe it. Please Advise

## 2021-11-17 NOTE — Telephone Encounter (Signed)
He needs an appointment

## 2021-12-06 ENCOUNTER — Telehealth: Payer: Self-pay | Admitting: Family Medicine

## 2021-12-06 ENCOUNTER — Other Ambulatory Visit: Payer: Self-pay | Admitting: Family Medicine

## 2021-12-06 DIAGNOSIS — F411 Generalized anxiety disorder: Secondary | ICD-10-CM

## 2021-12-06 MED ORDER — ALPRAZOLAM 0.5 MG PO TABS
0.2500 mg | ORAL_TABLET | Freq: Two times a day (BID) | ORAL | 2 refills | Status: DC | PRN
Start: 2021-12-06 — End: 2022-01-10

## 2021-12-06 NOTE — Telephone Encounter (Signed)
Last OV---05/10/2021 Last RF---#60 with 5 refills on 05/10/2021.

## 2022-01-10 ENCOUNTER — Encounter: Payer: Self-pay | Admitting: Family Medicine

## 2022-01-10 ENCOUNTER — Ambulatory Visit (INDEPENDENT_AMBULATORY_CARE_PROVIDER_SITE_OTHER): Payer: BLUE CROSS/BLUE SHIELD | Admitting: Family Medicine

## 2022-01-10 VITALS — BP 128/80 | HR 110 | Temp 98.0°F | Ht 76.0 in | Wt 256.5 lb

## 2022-01-10 DIAGNOSIS — F411 Generalized anxiety disorder: Secondary | ICD-10-CM

## 2022-01-10 DIAGNOSIS — J0100 Acute maxillary sinusitis, unspecified: Secondary | ICD-10-CM | POA: Diagnosis not present

## 2022-01-10 MED ORDER — PREDNISONE 20 MG PO TABS
40.0000 mg | ORAL_TABLET | Freq: Every day | ORAL | 0 refills | Status: AC
Start: 2022-01-10 — End: 2022-01-15

## 2022-01-10 MED ORDER — MIRTAZAPINE 15 MG PO TABS
15.0000 mg | ORAL_TABLET | Freq: Every day | ORAL | 2 refills | Status: DC
Start: 2022-01-10 — End: 2023-05-03

## 2022-01-10 MED ORDER — CLONAZEPAM 1 MG PO TABS: 1.0000 mg | ORAL_TABLET | Freq: Two times a day (BID) | ORAL | 1 refills | Status: DC | PRN

## 2022-01-10 NOTE — Patient Instructions (Signed)
Continue to push fluids, practice good hand hygiene, and cover your mouth if you cough. ? ?If you start having fevers, shaking or shortness of breath, seek immediate care. ? ?OK to take Tylenol 1000 mg (2 extra strength tabs) or 975 mg (3 regular strength tabs) every 6 hours as needed. ? ?Let us know if you need anything. ?

## 2022-01-10 NOTE — Progress Notes (Signed)
Chief Complaint  Patient presents with   Headache   Cough    Sinus congestion     Kurt Mcdonald here for URI complaints.  Duration: 2 days  Associated symptoms: subjective fever, sinus headache, sinus congestion, sinus pain, rhinorrhea, itchy watery eyes, sore throat, shortness of breath, myalgia, and coughing Denies: ear pain, ear drainage, wheezing, and N/V/D Treatment to date: Claritin D Sick contacts: Yes; around many people who have been sick where he works  Patient has a history of generalized anxiety.  He is going through divorce right now.  This is causing him lots of stress.  He takes it once daily.  He is not following with a therapist/counselor.  No homicidal/suicidal ideation.  No self-medication.  He has failed Cymbalta, Celexa, nortriptyline, amitriptyline, Effexor, Zoloft, Paxil, Prozac, Wellbutrin and Lexapro.  Past Medical History:  Diagnosis Date   Anxiety    Chicken pox    Chronic pain syndrome    Depression    Diverticulitis of colon without hemorrhage 06/04/2013   Diverticulosis of colon    Headache    History of pneumonia    Hypercholesteremia    Hypertension    Irritable bowel syndrome (IBS)    LBP (low back pain)    Male hypogonadism    NAFLD (nonalcoholic fatty liver disease)    Overweight(278.02)    Vision abnormalities     Objective BP 128/80 (BP Location: Right Arm, Patient Position: Sitting, Cuff Size: Large)   Pulse (!) 110   Temp 98 F (36.7 C) (Oral)   Ht '6\' 4"'$  (1.93 m)   Wt 256 lb 8 oz (116.3 kg)   SpO2 95%   BMI 31.22 kg/m  General: Awake, alert, appears stated age HEENT: AT, [place], ears patent b/l and TM's neg, nares patent w/o discharge, pharynx pink and without exudates, MMM, + sinus TTP over the maxillary sinuses bilaterally Neck: No masses or asymmetry Heart: Regular rhythm, tachycardic Lungs: CTAB, no accessory muscle use Psych: Age appropriate judgment and insight, normal mood and affect  Acute maxillary sinusitis,  recurrence not specified - Plan: predniSONE (DELTASONE) 20 MG tablet, COVID-19, Flu A+B and RSV  GAD (generalized anxiety disorder) - Plan: mirtazapine (REMERON) 15 MG tablet  5-day prednisone burst.  Check COVID, flu, and RSV.  If no improvement the next couple days and these tests are negative, could consider antibiotic therapy.  He will let me know on MyChart.  Continue to push fluids, practice good hand hygiene, cover mouth when coughing. F/u prn. If starting to experience fevers, shaking, or shortness of breath, seek immediate care. Chronic, unstable.  Will refill Klonopin, try to take it sparingly.  Will start Remeron 15 mg nightly.  Follow-up in 1 month for physical and to recheck this. Pt voiced understanding and agreement to the plan.  [place], DO 01/10/22 3:29 PM

## 2022-01-12 ENCOUNTER — Other Ambulatory Visit: Payer: Self-pay | Admitting: Family Medicine

## 2022-01-12 ENCOUNTER — Encounter: Payer: Self-pay | Admitting: Family Medicine

## 2022-01-12 LAB — COVID-19, FLU A+B AND RSV
Influenza A, NAA: NOT DETECTED
Influenza B, NAA: NOT DETECTED
RSV, NAA: DETECTED — AB
SARS-CoV-2, NAA: NOT DETECTED

## 2022-01-12 MED ORDER — ALBUTEROL SULFATE HFA 108 (90 BASE) MCG/ACT IN AERS: 2.0000 | INHALATION_SPRAY | Freq: Four times a day (QID) | RESPIRATORY_TRACT | 0 refills | Status: AC | PRN

## 2022-01-15 ENCOUNTER — Other Ambulatory Visit: Payer: Self-pay | Admitting: Family Medicine

## 2022-01-15 MED ORDER — BENZONATATE 200 MG PO CAPS: 200.0000 mg | ORAL_CAPSULE | Freq: Two times a day (BID) | ORAL | 0 refills | Status: DC | PRN

## 2022-02-03 ENCOUNTER — Other Ambulatory Visit: Payer: Self-pay | Admitting: Family Medicine

## 2022-02-05 ENCOUNTER — Encounter: Payer: Self-pay | Admitting: Family Medicine

## 2022-03-09 ENCOUNTER — Encounter: Payer: Self-pay | Admitting: Family Medicine

## 2022-04-15 ENCOUNTER — Other Ambulatory Visit: Payer: Self-pay | Admitting: Family Medicine

## 2022-04-15 DIAGNOSIS — R1319 Other dysphagia: Secondary | ICD-10-CM

## 2022-04-16 ENCOUNTER — Encounter: Payer: Self-pay | Admitting: *Deleted

## 2022-06-18 ENCOUNTER — Ambulatory Visit (INDEPENDENT_AMBULATORY_CARE_PROVIDER_SITE_OTHER): Payer: BLUE CROSS/BLUE SHIELD | Admitting: Family Medicine

## 2022-06-18 ENCOUNTER — Encounter: Payer: Self-pay | Admitting: Family Medicine

## 2022-06-18 VITALS — BP 132/85 | HR 102 | Temp 98.3°F | Ht 76.0 in | Wt 271.1 lb

## 2022-06-18 DIAGNOSIS — R300 Dysuria: Secondary | ICD-10-CM | POA: Diagnosis not present

## 2022-06-18 DIAGNOSIS — R238 Other skin changes: Secondary | ICD-10-CM | POA: Diagnosis not present

## 2022-06-18 LAB — POC URINALSYSI DIPSTICK (AUTOMATED)
Bilirubin, UA: NEGATIVE
Blood, UA: NEGATIVE
Glucose, UA: NEGATIVE
Ketones, UA: NEGATIVE
Leukocytes, UA: NEGATIVE
Nitrite, UA: NEGATIVE
Protein, UA: NEGATIVE
Spec Grav, UA: <=1.005 — AB (ref 1.010–1.025)
Urobilinogen, UA: 0.2 E.U./dL
pH, UA: 6.5 (ref 5.0–8.0)

## 2022-06-18 MED ORDER — CEPHALEXIN 500 MG PO CAPS: 500.0000 mg | ORAL_CAPSULE | Freq: Three times a day (TID) | ORAL | 0 refills | Status: AC

## 2022-06-18 NOTE — Progress Notes (Signed)
Chief Complaint  Patient presents with   Dysuria    Kurt Mcdonald is a 57 y.o. male here for possible UTI.  Duration: 2 days. Symptoms: Dysuria, irrigation around opening Denies: urinary frequency, hematuria, urinary hesitancy, urinary retention, fever, nausea, vomiting, urgency, discharge Hx of recurrent UTI? No Denies new sexual partners.  Past Medical History:  Diagnosis Date   Anxiety    Chicken pox    Chronic pain syndrome    Depression    Diverticulitis of colon without hemorrhage 06/04/2013   Diverticulosis of colon    Headache    History of pneumonia    Hypercholesteremia    Hypertension    Irritable bowel syndrome (IBS)    LBP (low back pain)    Male hypogonadism    NAFLD (nonalcoholic fatty liver disease)    Overweight(278.02)    Vision abnormalities      BP 132/85 (BP Location: Left Arm, Patient Position: Sitting, Cuff Size: Large)   Pulse (!) 102   Temp 98.3 F (36.8 C) (Oral)   Ht 6\' 4"  (1.93 m)   Wt 271 lb 2 oz (123 kg)   SpO2 93%   BMI 33.00 kg/m  General: Awake, alert, appears stated age GU: Circumcised penis, no external lesions outside of erythema surrounding the meatus Lungs: no accessory muscle usage Abd: BS+, soft, NT, ND, no masses or organomegaly MSK: No CVA tenderness, neg Lloyd's sign Psych: Age appropriate judgment and insight  Dysuria - Plan: POCT Urinalysis Dipstick (Automated), cephALEXin (KEFLEX) 500 MG capsule  Skin irritation - Plan: cephALEXin (KEFLEX) 500 MG capsule  UA is not suggestive of infection.  He will place some Aquaphor on the irritated skin around the meatal opening.  Will send in Keflex to cover for both cystitis and cellulitis. Stay hydrated. Seek immediate care if pt starts to develop fevers, new/worsening symptoms, uncontrollable N/V. F/u prn. The patient voiced understanding and agreement to the plan.  Jilda Roche [place], DO 06/18/22 2:30 PM

## 2022-06-18 NOTE — Patient Instructions (Signed)
Keep things clean and dry.  Apply Aquaphor after every void to form a barrier protecting the skin and allowing it to heal.   When you do wash it, use only soap and water. Do not vigorously scrub.   OK to take Tylenol 1000 mg (2 extra strength tabs) or 975 mg (3 regular strength tabs) every 6 hours as needed.  Ibuprofen 400-600 mg (2-3 over the counter strength tabs) every 6 hours as needed for pain.  Things to look out for: increasing pain not relieved by ibuprofen/acetaminophen, fevers, spreading redness, drainage of pus, or foul odor.  Let us know if you need anything.

## 2022-06-18 NOTE — Addendum Note (Signed)
Addended by: Scharlene Gloss B on: 06/18/2022 02:31 PM   Modules accepted: Orders

## 2022-06-19 LAB — URINE CULTURE
MICRO NUMBER:: 15090771
Result:: NO GROWTH
SPECIMEN QUALITY:: ADEQUATE

## 2022-07-18 ENCOUNTER — Encounter: Payer: BLUE CROSS/BLUE SHIELD | Admitting: Family Medicine

## 2022-08-26 ENCOUNTER — Other Ambulatory Visit: Payer: Self-pay | Admitting: Family Medicine

## 2022-08-27 NOTE — Telephone Encounter (Signed)
Last OV--06/08/2022 Last RF---#90 with 1 refill on 01/10/2022

## 2022-09-17 ENCOUNTER — Ambulatory Visit: Payer: BLUE CROSS/BLUE SHIELD | Admitting: Family Medicine

## 2022-09-17 ENCOUNTER — Encounter: Payer: Self-pay | Admitting: Family Medicine

## 2022-09-17 VITALS — BP 130/84 | HR 60 | Temp 98.6°F | Ht 76.0 in | Wt 271.5 lb

## 2022-09-17 DIAGNOSIS — R351 Nocturia: Secondary | ICD-10-CM | POA: Diagnosis not present

## 2022-09-17 DIAGNOSIS — K219 Gastro-esophageal reflux disease without esophagitis: Secondary | ICD-10-CM | POA: Diagnosis not present

## 2022-09-17 DIAGNOSIS — H9313 Tinnitus, bilateral: Secondary | ICD-10-CM | POA: Diagnosis not present

## 2022-09-17 DIAGNOSIS — N401 Enlarged prostate with lower urinary tract symptoms: Secondary | ICD-10-CM

## 2022-09-17 MED ORDER — PANTOPRAZOLE SODIUM 40 MG PO TBEC: DELAYED_RELEASE_TABLET | ORAL | 1 refills | Status: DC

## 2022-09-17 MED ORDER — TAMSULOSIN HCL 0.4 MG PO CAPS: 0.4000 mg | ORAL_CAPSULE | Freq: Every day | ORAL | 3 refills | Status: DC

## 2022-09-17 NOTE — Patient Instructions (Addendum)
Let me know if you would like to see a specialist for the ringing in the ears.  Heat (pad or rice pillow in microwave) over affected area, 10-15 minutes twice daily.   Ice/cold pack over area for 10-15 min twice daily.  OK to take Tylenol 1000 mg (2 extra strength tabs) or 975 mg (3 regular strength tabs) every 6 hours as needed.  Let us know if you need anything.  Trapezius stretches/exercises Do exercises exactly as told by your health care provider and adjust them as directed. It is normal to feel mild stretching, pulling, tightness, or discomfort as you do these exercises, but you should stop right away if you feel sudden pain or your pain gets worse.   Stretching and range of motion exercises These exercises warm up your muscles and joints and improve the movement and flexibility of your shoulder. These exercises can also help to relieve pain, numbness, and tingling. If you are unable to do any of the following for any reason, do not further attempt to do it.   Exercise A: Flexion, standing     Stand and hold a broomstick, a cane, or a similar object. Place your hands a little more than shoulder-width apart on the object. Your left / right hand should be palm-up, and your other hand should be palm-down. Push the stick to raise your left / right arm out to your side and then over your head. Use your other hand to help move the stick. Stop when you feel a stretch in your shoulder, or when you reach the angle that is recommended by your health care provider. Avoid shrugging your shoulder while you raise your arm. Keep your shoulder blade tucked down toward your spine. Hold for 30 seconds. Slowly return to the starting position. Repeat 2 times. Complete this exercise 3 times per week.  Exercise B: Abduction, supine     Lie on your back and hold a broomstick, a cane, or a similar object. Place your hands a little more than shoulder-width apart on the object. Your left / right hand should  be palm-up, and your other hand should be palm-down. Push the stick to raise your left / right arm out to your side and then over your head. Use your other hand to help move the stick. Stop when you feel a stretch in your shoulder, or when you reach the angle that is recommended by your health care provider. Avoid shrugging your shoulder while you raise your arm. Keep your shoulder blade tucked down toward your spine. Hold for 30 seconds. Slowly return to the starting position. Repeat 2 times. Complete this exercise 3 times per week.  Exercise C: Flexion, active-assisted     Lie on your back. You may bend your knees for comfort. Hold a broomstick, a cane, or a similar object. Place your hands about shoulder-width apart on the object. Your palms should face toward your feet. Raise the stick and move your arms over your head and behind your head, toward the floor. Use your healthy arm to help your left / right arm move farther. Stop when you feel a gentle stretch in your shoulder, or when you reach the angle where your health care provider tells you to stop. Hold for 30 seconds. Slowly return to the starting position. Repeat 2 times. Complete this exercise 3 times per week.  Exercise D: External rotation and abduction     Stand in a door frame with one of your feet slightly in  front of the other. This is called a staggered stance. Choose one of the following positions as told by your health care provider: Place your hands and forearms on the door frame above your head. Place your hands and forearms on the door frame at the height of your head. Place your hands on the door frame at the height of your elbows. Slowly move your weight onto your front foot until you feel a stretch across your chest and in the front of your shoulders. Keep your head and chest upright and keep your abdominal muscles tight. Hold for 30 seconds. To release the stretch, shift your weight to your back foot. Repeat 2  times. Complete this stretch 3 times per week.  Strengthening exercises These exercises build strength and endurance in your shoulder. Endurance is the ability to use your muscles for a long time, even after your muscles get tired. Exercise E: Scapular depression and adduction  Sit on a stable chair. Support your arms in front of you with pillows, armrests, or a tabletop. Keep your elbows in line with the sides of your body. Gently move your shoulder blades down toward your middle back. Relax the muscles on the tops of your shoulders and in the back of your neck. Hold for 3 seconds. Slowly release the tension and relax your muscles completely before doing this exercise again. Repeat for a total of 10 repetitions. After you have practiced this exercise, try doing the exercise without the arm support. Then, try the exercise while standing instead of sitting. Repeat 2 times. Complete this exercise 3 times per week.  Exercise F: Shoulder abduction, isometric     Stand or sit about 4-6 inches (10-15 cm) from a wall with your left / right side facing the wall. Bend your left / right elbow and gently press your elbow against the wall. Increase the pressure slowly until you are pressing as hard as you can without shrugging your shoulder. Hold for 3 seconds. Slowly release the tension and relax your muscles completely. Repeat for a total of 10 repetitions. Repeat 2 times. Complete this exercise 3 times per week.  Exercise G: Shoulder flexion, isometric     Stand or sit about 4-6 inches (10-15 cm) away from a wall with your left / right side facing the wall. Keep your left / right elbow straight and gently press the top of your fist against the wall. Increase the pressure slowly until you are pressing as hard as you can without shrugging your shoulder. Hold for 10-15 seconds. Slowly release the tension and relax your muscles completely. Repeat for a total of 10 repetitions. Repeat 2 times.  Complete this exercise 3 times per week.  Exercise H: Internal rotation     Sit in a stable chair without armrests, or stand. Secure an exercise band at your left / right side, at elbow height. Place a soft object, such as a folded towel or a small pillow, under your left / right upper arm so your elbow is a few inches (about 8 cm) away from your side. Hold the end of the exercise band so the band stretches. Keeping your elbow pressed against the soft object under your arm, move your forearm across your body toward your abdomen. Keep your body steady so the movement is only coming from your shoulder. Hold for 3 seconds. Slowly return to the starting position. Repeat for a total of 10 repetitions. Repeat 2 times. Complete this exercise 3 times per week.  Exercise  I: External rotation     Sit in a stable chair without armrests, or stand. Secure an exercise band at your left / right side, at elbow height. Place a soft object, such as a folded towel or a small pillow, under your left / right upper arm so your elbow is a few inches (about 8 cm) away from your side. Hold the end of the exercise band so the band stretches. Keeping your elbow pressed against the soft object under your arm, move your forearm out, away from your abdomen. Keep your body steady so the movement is only coming from your shoulder. Hold for 3 seconds. Slowly return to the starting position. Repeat for a total of 10 repetitions. Repeat 2 times. Complete this exercise 3 times per week. Exercise J: Shoulder extension  Sit in a stable chair without armrests, or stand. Secure an exercise band to a stable object in front of you so the band is at shoulder height. Hold one end of the exercise band in each hand. Your palms should face each other. Straighten your elbows and lift your hands up to shoulder height. Step back, away from the secured end of the exercise band, until the band stretches. Squeeze your shoulder blades  together and pull your hands down to the sides of your thighs. Stop when your hands are straight down by your sides. Do not let your hands go behind your body. Hold for 3 seconds. Slowly return to the starting position. Repeat for a total of 10 repetitions. Repeat 2 times. Complete this exercise 3 times per week.  Exercise K: Shoulder extension, prone     Lie on your abdomen on a firm surface so your left / right arm hangs over the edge. Hold a 5 lb weight in your hand so your palm faces in toward your body. Your arm should be straight. Squeeze your shoulder blade down toward the middle of your back. Slowly raise your arm behind you, up to the height of the surface that you are lying on. Keep your arm straight. Hold for 3 seconds. Slowly return to the starting position and relax your muscles. Repeat for a total of 10 repetitions. Repeat 2 times. Complete this exercise 3 times per week.   Exercise L: Horizontal abduction, prone  Lie on your abdomen on a firm surface so your left / right arm hangs over the edge. Hold a 5 lb weight in your hand so your palm faces toward your feet. Your arm should be straight. Squeeze your shoulder blade down toward the middle of your back. Bend your elbow so your hand moves up, until your elbow is bent to an "L" shape (90 degrees). With your elbow bent, slowly move your forearm forward and up. Raise your hand up to the height of the surface that you are lying on. Your upper arm should not move, and your elbow should stay bent. At the top of the movement, your palm should face the floor. Hold for 3 seconds. Slowly return to the starting position and relax your muscles. Repeat for a total of 10 repetitions. Repeat 2 times. Complete this exercise 3 times per week.  Exercise M: Horizontal abduction, standing  Sit on a stable chair, or stand. Secure an exercise band to a stable object in front of you so the band is at shoulder height. Hold one end of the  exercise band in each hand. Straighten your elbows and lift your hands straight in front of you, up to shoulder  height. Your palms should face down, toward the floor. Step back, away from the secured end of the exercise band, until the band stretches. Move your arms out to your sides, and keep your arms straight. Hold for 3 seconds. Slowly return to the starting position. Repeat for a total of 10 repetitions. Repeat 2 times. Complete this exercise 3 times per week.  Exercise N: Scapular retraction and elevation  Sit on a stable chair, or stand. Secure an exercise band to a stable object in front of you so the band is at shoulder height. Hold one end of the exercise band in each hand. Your palms should face each other. Sit in a stable chair without armrests, or stand. Step back, away from the secured end of the exercise band, until the band stretches. Squeeze your shoulder blades together and lift your hands over your head. Keep your elbows straight. Hold for 3 seconds. Slowly return to the starting position. Repeat for a total of 10 repetitions. Repeat 2 times. Complete this exercise 3 times per week.  This information is not intended to replace advice given to you by your health care provider. Make sure you discuss any questions you have with your health care provider. Document Released: 12/18/2004 Document Revised: 08/25/2015 Document Reviewed: 11/04/2014 Elsevier Interactive Patient Education  2017 [employer].

## 2022-09-17 NOTE — Progress Notes (Signed)
Chief Complaint  Patient presents with   Urinary Frequency    At night Waking frequently at night having to urinate    Tinnitus    Subjective: Patient is a 57 y.o. male here for ringing in ears.  This has been going on for several years but worsened over the past 3 months.  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  His hearing is unremarkable.  He sleeps with a fan at night.  He is not having any wax in his ear or pain.  Over the past year, the patient has been having more frequent nighttime urination.  Some nights, it is up to 6 times.  Stream/flow seems to be unremarkable.  He is never tried a medication for this.  He denies any bleeding, discharge, pain,.  He does have mild constipation.  Past Medical History:  Diagnosis Date   Anxiety    Chicken pox    Chronic pain syndrome    Depression    Diverticulitis of colon without hemorrhage 06/04/2013   Diverticulosis of colon    Headache    History of pneumonia    Hypercholesteremia    Hypertension    Irritable bowel syndrome (IBS)    LBP (low back pain)    Male hypogonadism    NAFLD (nonalcoholic fatty liver disease)    Overweight(278.02)    Vision abnormalities     Objective: BP 130/84 (BP Location: Left Arm, Cuff Size: Large)   Pulse 60   Temp 98.6 F (37 C) (Oral)   Ht 6\' 4"  (1.93 m)   Wt 271 lb 8 oz (123.2 kg)   SpO2 94%   BMI 33.05 kg/m  General: Awake, appears stated age Heart: RRR, no LE edema Lungs: CTAB, no rales, wheezes or rhonchi. No accessory muscle use Abdomen: Bowel sounds present, soft, nontender, nondistended Ears: Ears are patent without otorrhea or cerumen, TMs negative bilaterally Psych: Age appropriate judgment and insight, normal affect and mood  Assessment and Plan: Tinnitus of both ears  Benign prostatic hyperplasia with nocturia - Plan: tamsulosin (FLOMAX) 0.4 MG CAPS capsule, Urinalysis  Gastroesophageal reflux disease without esophagitis  - Plan: pantoprazole (PROTONIX) 40 MG tablet  Chronic issue.  Not a great option for this.  Offered ENT and CBT referral which he politely declined for now.  He will reach back out at the beginning of the year if he changes his mind. Chronic, uncontrolled.  Start Flomax 0.4 mg daily.  Check urine today.  Unlikely to be an infection having gone on for 1 year.  Follow-up in about a month to recheck this in addition to having a physical. Refill Protonix 40 mg daily. The patient voiced understanding and agreement to the plan.  Jilda Roche [place], DO 09/17/22  4:40 PM

## 2022-09-18 LAB — URINALYSIS
Bilirubin Urine: NEGATIVE
Hgb urine dipstick: NEGATIVE
Ketones, ur: NEGATIVE
Leukocytes,Ua: NEGATIVE
Nitrite: NEGATIVE
Specific Gravity, Urine: 1.02 (ref 1.000–1.030)
Total Protein, Urine: NEGATIVE
Urine Glucose: NEGATIVE
Urobilinogen, UA: 0.2 (ref 0.0–1.0)
pH: 6 (ref 5.0–8.0)

## 2022-10-01 ENCOUNTER — Encounter: Payer: BLUE CROSS/BLUE SHIELD | Admitting: Family Medicine

## 2022-10-03 ENCOUNTER — Other Ambulatory Visit (HOSPITAL_COMMUNITY): Payer: Self-pay

## 2022-10-03 ENCOUNTER — Telehealth: Payer: Self-pay | Admitting: Pharmacy Technician

## 2022-10-03 NOTE — Telephone Encounter (Signed)
Pharmacy Patient Advocate Encounter   Received notification from CoverMyMeds that prior authorization for Pantoprazole Sodium 40MG  dr tablets is required/requested.   Insurance verification completed.   The patient is insured through [facility] .   Per test claim: PA required; PA started via CoverMyMeds. KEY [id number] . Waiting for clinical questions to populate.

## 2022-10-04 NOTE — Telephone Encounter (Signed)
*  Primary  Prior Authorization form/request asks a question that requires your assistance. Please see the question below and advise accordingly.    Has the patient tried and failed AT LEAST 2 of the following over-the-counter proton pump inhibitor: Omeprazole capsule (Prilosec OTC), Esomeprazole capsule (Nexium OTC), Lansoprazole capsule (Prevacid OTC)?

## 2022-10-04 NOTE — Telephone Encounter (Signed)
No other regimens seen in med hx.

## 2022-10-05 NOTE — Telephone Encounter (Signed)
Called left message to call back 

## 2022-10-05 NOTE — Telephone Encounter (Signed)
Called informed the patient and he will use the goodrx

## 2022-10-15 ENCOUNTER — Ambulatory Visit (HOSPITAL_BASED_OUTPATIENT_CLINIC_OR_DEPARTMENT_OTHER)
Admission: RE | Admit: 2022-10-15 | Discharge: 2022-10-15 | Disposition: A | Discharge status: Home / Self Care | Payer: BLUE CROSS/BLUE SHIELD | Source: Ambulatory Visit | Attending: Family Medicine | Admitting: Family Medicine

## 2022-10-15 ENCOUNTER — Other Ambulatory Visit: Payer: Self-pay | Admitting: Family Medicine

## 2022-10-15 ENCOUNTER — Encounter: Payer: Self-pay | Admitting: Family Medicine

## 2022-10-15 ENCOUNTER — Ambulatory Visit (INDEPENDENT_AMBULATORY_CARE_PROVIDER_SITE_OTHER): Payer: BLUE CROSS/BLUE SHIELD | Admitting: Family Medicine

## 2022-10-15 VITALS — BP 132/80 | HR 82 | Temp 98.0°F | Ht 76.0 in | Wt 265.5 lb

## 2022-10-15 DIAGNOSIS — G629 Polyneuropathy, unspecified: Secondary | ICD-10-CM

## 2022-10-15 DIAGNOSIS — R413 Other amnesia: Secondary | ICD-10-CM | POA: Diagnosis not present

## 2022-10-15 DIAGNOSIS — Z8249 Family history of ischemic heart disease and other diseases of the circulatory system: Secondary | ICD-10-CM | POA: Insufficient documentation

## 2022-10-15 DIAGNOSIS — Z79899 Other long term (current) drug therapy: Secondary | ICD-10-CM | POA: Diagnosis not present

## 2022-10-15 DIAGNOSIS — Z125 Encounter for screening for malignant neoplasm of prostate: Secondary | ICD-10-CM

## 2022-10-15 DIAGNOSIS — R972 Elevated prostate specific antigen [PSA]: Secondary | ICD-10-CM

## 2022-10-15 DIAGNOSIS — Z0000 Encounter for general adult medical examination without abnormal findings: Secondary | ICD-10-CM | POA: Diagnosis not present

## 2022-10-15 LAB — COMPREHENSIVE METABOLIC PANEL
ALT: 23 U/L (ref 0–53)
AST: 20 U/L (ref 0–37)
Albumin: 4.1 g/dL (ref 3.5–5.2)
Alkaline Phosphatase: 39 U/L (ref 39–117)
BUN: 17 mg/dL (ref 6–23)
CO2: 28 meq/L (ref 19–32)
Calcium: 9.6 mg/dL (ref 8.4–10.5)
Chloride: 106 meq/L (ref 96–112)
Creatinine, Ser: 1.43 mg/dL (ref 0.40–1.50)
GFR: 54.47 mL/min — ABNORMAL LOW (ref >60.00)
Glucose, Bld: 96 mg/dL (ref 70–99)
Potassium: 4.9 meq/L (ref 3.5–5.1)
Sodium: 140 meq/L (ref 135–145)
Total Bilirubin: 0.8 mg/dL (ref 0.2–1.2)
Total Protein: 6.7 g/dL (ref 6.0–8.3)

## 2022-10-15 LAB — CBC
HCT: 46.7 % (ref 39.0–52.0)
Hemoglobin: 14.2 g/dL (ref 13.0–17.0)
MCHC: 30.4 g/dL (ref 30.0–36.0)
MCV: 80.2 fL (ref 78.0–100.0)
Platelets: 408 10*3/uL — ABNORMAL HIGH (ref 150.0–400.0)
RBC: 5.83 Mil/uL — ABNORMAL HIGH (ref 4.22–5.81)
RDW: 18.4 % — ABNORMAL HIGH (ref 11.5–15.5)
WBC: 6.3 10*3/uL (ref 4.0–10.5)

## 2022-10-15 LAB — VITAMIN B12: Vitamin B-12: 1064 pg/mL — ABNORMAL HIGH (ref 211–911)

## 2022-10-15 LAB — PSA: PSA: 9.9 ng/mL — ABNORMAL HIGH (ref 0.10–4.00)

## 2022-10-15 LAB — LIPID PANEL
Cholesterol: 178 mg/dL (ref 0–200)
HDL: 34.4 mg/dL — ABNORMAL LOW (ref >39.00)
LDL Cholesterol: 116 mg/dL — ABNORMAL HIGH (ref 0–99)
NonHDL: 143.73
Total CHOL/HDL Ratio: 5
Triglycerides: 140 mg/dL (ref 0.0–149.0)
VLDL: 28 mg/dL (ref 0.0–40.0)

## 2022-10-15 LAB — TSH: TSH: 2.28 u[IU]/mL (ref 0.35–5.50)

## 2022-10-15 MED ORDER — GABAPENTIN 300 MG PO CAPS: 300.0000 mg | ORAL_CAPSULE | Freq: Every day | ORAL | 3 refills | Status: DC

## 2022-10-15 NOTE — Progress Notes (Signed)
Chief Complaint  Patient presents with   Annual Exam    Well Male Kurt Mcdonald is here for a complete physical.   His last physical was >1 year ago.  Current diet: in general, a "pretty good" diet.  Current exercise: walking, active at work Weight trend: stable Fatigue out of ordinary? No Seat belt? Yes.   Advanced directive? No  Health maintenance Shingrix- Yes Colonoscopy- Yes Tetanus- Yes HIV- Yes Hep C- Yes  Neuropathy The patient has had neuropathy for many years in his feet.  It is more noticeable in the last several weeks.  He is not taking anything routinely.  It affects him mainly at night.  He has associated cramping.  Memory Over the past several months, the patient has noticed difficulty remembering things.  It is not particular worse in the but he did more bring it up.  He gets around 6 to 7 hours of sleep nightly.  He does take B12 shots monthly.   Past Medical History:  Diagnosis Date   Anxiety    Chicken pox    Chronic pain syndrome    Depression    Diverticulitis of colon without hemorrhage 06/04/2013   Diverticulosis of colon    Headache    History of pneumonia    Hypercholesteremia    Hypertension    Irritable bowel syndrome (IBS)    LBP (low back pain)    Male hypogonadism    NAFLD (nonalcoholic fatty liver disease)    Overweight(278.02)    Vision abnormalities      Past Surgical History:  Procedure Laterality Date   shoulder sugery  01/2010   right   TOOTH EXTRACTION  11/2011   WISDOM TOOTH EXTRACTION      Medications  Current Outpatient Medications on File Prior to Visit  Medication Sig Dispense Refill   albuterol (VENTOLIN HFA) 108 (90 Base) MCG/ACT inhaler Inhale 2 puffs into the lungs every 6 (six) hours as needed for wheezing or shortness of breath. 18 g 0   clonazePAM (KLONOPIN) 1 MG tablet TAKE 1 TABLET(1 MG) BY MOUTH TWICE DAILY AS NEEDED FOR ANXIETY 90 tablet 1   mirtazapine (REMERON) 15 MG tablet Take 1 tablet (15 mg total)  by mouth at bedtime. 30 tablet 2   pantoprazole (PROTONIX) 40 MG tablet TAKE 1 TABLET(40 MG) BY MOUTH DAILY 90 tablet 1   sildenafil (VIAGRA) 100 MG tablet TAKE 1/2 TO 1 TABLET(50 TO 100 MG) BY MOUTH DAILY AS NEEDED FOR ERECTILE DYSFUNCTION 5 tablet 11   tamsulosin (FLOMAX) 0.4 MG CAPS capsule Take 1 capsule (0.4 mg total) by mouth daily. 30 capsule 3    Allergies Allergies  Allergen Reactions   Butrans [Buprenorphine] Other (See Comments)    Felt funny   Crestor [Rosuvastatin Calcium] Other (See Comments)    Severe leg cramping   Fluoxetine Other (See Comments)    GI Upset (intolerance), Headache   Duloxetine Nausea Only   Sulfa Antibiotics Rash    Family History Family History  Problem Relation Age of Onset   Healthy Mother        Living   Pneumonia Father 63       Deceased   Bone cancer Paternal Grandfather    Diabetes Maternal Grandfather    Healthy Brother    Healthy Son        x1   Healthy Daughter        x2    Review of Systems: Constitutional:  no fevers Eye:  no  recent significant change in vision Ear/Nose/Mouth/Throat:  Ears:  no hearing loss Nose/Mouth/Throat:  no complaints of nasal congestion, no sore throat Cardiovascular:  no chest pain Respiratory:  no shortness of breath Gastrointestinal:  no change in bowel habits GU:  Male: negative for dysuria, frequency Musculoskeletal/Extremities:  no joint pain Integumentary (Skin/Breast):  no abnormal skin lesions reported Neurologic:  as noted in HPI Endocrine: No unexpected weight changes Hematologic/Lymphatic:  no abnormal bleeding  Exam BP 132/80 (BP Location: Left Arm, Patient Position: Sitting, Cuff Size: Large)   Pulse 82   Temp 98 F (36.7 C) (Oral)   Ht 6\' 4"  (1.93 m)   Wt 265 lb 8 oz (120.4 kg)   SpO2 97%   BMI 32.32 kg/m  General:  well developed, well nourished, in no apparent distress Skin:  no significant moles, warts, or growths Head:  no masses, lesions, or tenderness Eyes:  pupils  equal and round, sclera anicteric without injection Ears:  canals without lesions, TMs shiny without retraction, no obvious effusion, no erythema Nose:  nares patent, mucosa normal Throat/Pharynx:  lips and gingiva without lesion; tongue and uvula midline; non-inflamed pharynx; no exudates or postnasal drainage Neck: neck supple without adenopathy, thyromegaly, or masses Cardiac: RRR, no bruits, no LE edema Lungs:  clear to auscultation, breath sounds equal bilaterally, no respiratory distress Abdomen: BS+, soft, non-tender, non-distended, no masses or organomegaly noted Rectal: Deferred Musculoskeletal:  symmetrical muscle groups noted without atrophy or deformity Neuro:  gait normal; deep tendon reflexes normal and symmetric Psych: well oriented with normal range of affect and appropriate judgment/insight  Assessment and Plan  Well adult exam - Plan: Lipid panel, CBC, Comprehensive metabolic panel  Screening for prostate cancer - Plan: PSA  Encounter for long-term (current) use of high-risk medication - Plan: Drug Monitoring Panel [phone] , Urine   Well 57 y.o. male. Counseled on diet and exercise. Counseled on risks and benefits of prostate cancer screening with PSA. The patient agrees to undergo testing. UDS and CSC updated today for Klonopin.  Advanced directive form provided today.  Immunizations, labs, and further orders as above. Neuropathy: Chronic, unstable. Start gabapentin 300 mg qhs. F/u in 1 mo if no better Memory: Ck above labs.  He would like to wait until 2025 to consider imaging and neuropsych evaluation.  Try to aim for at least 7 hours of sleep nightly.  Stay mentally active. Follow up in 6 mo. The patient voiced understanding and agreement to the plan.  Jilda Roche [place], DO 10/15/22 7:10 AM

## 2022-10-15 NOTE — Patient Instructions (Addendum)
Give [place] 2-3 business days to get the results of your labs back.   Keep the diet clean and stay active.  Please get me a copy of your advanced directive form at your convenience.   Take the gabapentin capsule around 1130-midnight for a planned 1-2 AM bedtime to help with neuropathy.   Take Metamucil or Benefiber daily.  Try 2 tablespoons of milk of mag in 4 oz of warm prune juice. Do that and wait a couple hours. If no improvement, try a Dulcolax suppository and then let me know if we are still having issues.   Let us know if you need anything.

## 2022-10-16 ENCOUNTER — Other Ambulatory Visit (HOSPITAL_BASED_OUTPATIENT_CLINIC_OR_DEPARTMENT_OTHER): Payer: BLUE CROSS/BLUE SHIELD

## 2022-10-17 ENCOUNTER — Other Ambulatory Visit: Payer: Self-pay | Admitting: Family Medicine

## 2022-10-17 ENCOUNTER — Ambulatory Visit: Payer: BLUE CROSS/BLUE SHIELD | Admitting: Urology

## 2022-10-17 ENCOUNTER — Encounter: Payer: Self-pay | Admitting: Urology

## 2022-10-17 VITALS — BP 159/100 | HR 81 | Ht 76.0 in | Wt 265.0 lb

## 2022-10-17 DIAGNOSIS — N138 Other obstructive and reflux uropathy: Secondary | ICD-10-CM | POA: Diagnosis not present

## 2022-10-17 DIAGNOSIS — R972 Elevated prostate specific antigen [PSA]: Secondary | ICD-10-CM

## 2022-10-17 DIAGNOSIS — E785 Hyperlipidemia, unspecified: Secondary | ICD-10-CM

## 2022-10-17 DIAGNOSIS — N401 Enlarged prostate with lower urinary tract symptoms: Secondary | ICD-10-CM

## 2022-10-17 LAB — URINALYSIS, ROUTINE W REFLEX MICROSCOPIC
Bilirubin, UA: NEGATIVE
Glucose, UA: NEGATIVE
Ketones, UA: NEGATIVE
Leukocytes,UA: NEGATIVE
Nitrite, UA: NEGATIVE
Protein,UA: NEGATIVE
RBC, UA: NEGATIVE
Specific Gravity, UA: >1.03 — ABNORMAL HIGH (ref 1.005–1.030)
Urobilinogen, Ur: 0.2 mg/dL (ref 0.2–1.0)
pH, UA: 6 (ref 5.0–7.5)

## 2022-10-17 MED ORDER — ATORVASTATIN CALCIUM 40 MG PO TABS: 40.0000 mg | ORAL_TABLET | Freq: Every day | ORAL | 3 refills | Status: DC

## 2022-10-17 NOTE — Progress Notes (Signed)
Assessment: 1. Elevated PSA   2. BPH with obstruction/lower urinary tract symptoms     Plan: I personally reviewed the patient's chart including provider notes, and lab results. Today I had a long discussion with the patient regarding PSA and the rationale and controversies of prostate cancer early detection.  I discussed the pros and cons of further evaluation including TRUS and prostate Bx.  Potential adverse events and complications as well as standard instructions were given.  Patient expressed his understanding of these issues. Schedule for TRUS/BX  Chief Complaint:  Chief Complaint  Patient presents with   Elevated PSA    History of Present Illness:  Kurt Mcdonald is a 57 y.o. male who is seen in consultation from [place], DO for evaluation of elevated PSA.  PSA results: 4/22 6.91 5/22 6.42 10/24 9.90  No prior prostate biopsy.  No history of prostatitis or UTIs.  No family history of prostate cancer. He has noted some recent lower urinary tract symptoms with frequency, nocturia x 6, and hesitancy.  No dysuria or gross hematuria.  He was started on tamsulosin several weeks ago and has noted improvement in his urinary symptoms. IPSS = 9 QOL = 3/6 today.   Past Medical History:  Past Medical History:  Diagnosis Date   Anxiety    Chicken pox    Chronic pain syndrome    Depression    Diverticulitis of colon without hemorrhage 06/04/2013   Diverticulosis of colon    Headache    History of pneumonia    Hypercholesteremia    Hypertension    Irritable bowel syndrome (IBS)    LBP (low back pain)    Male hypogonadism    NAFLD (nonalcoholic fatty liver disease)    Overweight(278.02)    Vision abnormalities     Past Surgical History:  Past Surgical History:  Procedure Laterality Date   shoulder sugery  01/2010   right   TOOTH EXTRACTION  11/2011   WISDOM TOOTH EXTRACTION      Allergies:  Allergies  Allergen Reactions   Butrans  [Buprenorphine] Other (See Comments)    Felt funny   Crestor [Rosuvastatin Calcium] Other (See Comments)    Severe leg cramping   Fluoxetine Other (See Comments)    GI Upset (intolerance), Headache   Duloxetine Nausea Only   Sulfa Antibiotics Rash    Family History:  Family History  Problem Relation Age of Onset   Healthy Mother        Living   Pneumonia Father 69       Deceased   Bone cancer Paternal Grandfather    Diabetes Maternal Grandfather    Healthy Brother    Healthy Son        x1   Healthy Daughter        x2    Social History:  Social History   Tobacco Use   Smoking status: Never   Smokeless tobacco: Never  Vaping Use   Vaping status: Never Used  Substance Use Topics   Alcohol use: Yes    Alcohol/week: 0.0 standard drinks of alcohol    Comment: Rare   Drug use: No    Review of symptoms:  Constitutional:  Negative for unexplained weight loss, night sweats, fever, chills ENT:  Negative for nose bleeds, sinus pain, painful swallowing CV:  Negative for chest pain, shortness of breath, exercise intolerance, palpitations, loss of consciousness Resp:  Negative for cough, wheezing, shortness of breath GI:  Negative for nausea,  vomiting, diarrhea, bloody stools GU:  Positives noted in HPI; otherwise negative for gross hematuria, dysuria, urinary incontinence Neuro:  Negative for seizures, poor balance, limb weakness, slurred speech Psych:  Negative for lack of energy, depression, anxiety Endocrine:  Negative for polydipsia, polyuria, symptoms of hypoglycemia (dizziness, hunger, sweating) Hematologic:  Negative for anemia, purpura, petechia, prolonged or excessive bleeding, use of anticoagulants  Allergic:  Negative for difficulty breathing or choking as a result of exposure to anything; no shellfish allergy; no allergic response (rash/itch) to materials, foods  Physical exam: BP (!) 159/100   Pulse 81   Ht 6\' 4"  (1.93 m)   Wt 265 lb (120.2 kg)   BMI 32.26  kg/m  GENERAL APPEARANCE:  Well appearing, well developed, well nourished, NAD HEENT: Atraumatic, Normocephalic, oropharynx clear. NECK: Supple without lymphadenopathy or thyromegaly. LUNGS: Clear to auscultation bilaterally. HEART: Regular Rate and Rhythm without murmurs, gallops, or rubs. ABDOMEN: Soft, non-tender, No Masses. EXTREMITIES: Moves all extremities well.  Without clubbing, cyanosis, or edema. NEUROLOGIC:  Alert and oriented x 3, normal gait, CN II-XII grossly intact.  MENTAL STATUS:  Appropriate. BACK:  Non-tender to palpation.  No CVAT SKIN:  Warm, dry and intact.   GU: Penis:  circumcised Meatus: Normal Scrotum: normal, no masses Testis: normal without masses bilateral Prostate: 50 g, NT, no nodules Rectum: Normal tone,  no masses or tenderness   Results: U/A: negative

## 2022-10-18 LAB — DRUG MONITORING PANEL 376104, URINE
Amphetamines: NEGATIVE ng/mL (ref <500)
Barbiturates: NEGATIVE ng/mL (ref <300)
Benzodiazepines: NEGATIVE ng/mL (ref <100)
Cocaine Metabolite: NEGATIVE ng/mL (ref <150)
Desmethyltramadol: NEGATIVE ng/mL (ref <100)
Opiates: NEGATIVE ng/mL (ref <100)
Oxycodone: NEGATIVE ng/mL (ref <100)
Tramadol: NEGATIVE ng/mL (ref <100)

## 2022-10-18 LAB — DM TEMPLATE

## 2022-11-01 ENCOUNTER — Telehealth: Payer: Self-pay | Admitting: Urology

## 2022-11-01 NOTE — Telephone Encounter (Signed)
Called patient to confirm that he wanted to cancel his appt on 11/06/22 for his Prostate BX. He text No to confirm. Kurt Mcdonald

## 2022-11-06 ENCOUNTER — Other Ambulatory Visit: Payer: BLUE CROSS/BLUE SHIELD | Admitting: Urology

## 2022-11-06 ENCOUNTER — Ambulatory Visit (HOSPITAL_BASED_OUTPATIENT_CLINIC_OR_DEPARTMENT_OTHER): Payer: BLUE CROSS/BLUE SHIELD

## 2022-11-27 ENCOUNTER — Other Ambulatory Visit (HOSPITAL_BASED_OUTPATIENT_CLINIC_OR_DEPARTMENT_OTHER): Payer: Self-pay | Admitting: Urology

## 2022-11-27 DIAGNOSIS — R972 Elevated prostate specific antigen [PSA]: Secondary | ICD-10-CM

## 2022-11-28 ENCOUNTER — Ambulatory Visit (HOSPITAL_BASED_OUTPATIENT_CLINIC_OR_DEPARTMENT_OTHER)
Admission: RE | Admit: 2022-11-28 | Discharge: 2022-11-28 | Disposition: A | Discharge status: Home / Self Care | Payer: BLUE CROSS/BLUE SHIELD | Source: Ambulatory Visit | Attending: Urology | Admitting: Urology

## 2022-11-28 ENCOUNTER — Other Ambulatory Visit (INDEPENDENT_AMBULATORY_CARE_PROVIDER_SITE_OTHER): Payer: BLUE CROSS/BLUE SHIELD

## 2022-11-28 ENCOUNTER — Other Ambulatory Visit: Payer: BLUE CROSS/BLUE SHIELD

## 2022-11-28 ENCOUNTER — Ambulatory Visit: Payer: BLUE CROSS/BLUE SHIELD | Admitting: Urology

## 2022-11-28 ENCOUNTER — Encounter: Payer: Self-pay | Admitting: Urology

## 2022-11-28 VITALS — BP 154/95 | HR 64 | Ht 76.0 in | Wt 270.0 lb

## 2022-11-28 DIAGNOSIS — N4231 Prostatic intraepithelial neoplasia: Secondary | ICD-10-CM

## 2022-11-28 DIAGNOSIS — R972 Elevated prostate specific antigen [PSA]: Secondary | ICD-10-CM | POA: Diagnosis present

## 2022-11-28 DIAGNOSIS — Z2989 Encounter for other specified prophylactic measures: Secondary | ICD-10-CM | POA: Diagnosis not present

## 2022-11-28 DIAGNOSIS — E785 Hyperlipidemia, unspecified: Secondary | ICD-10-CM

## 2022-11-28 LAB — URINALYSIS, ROUTINE W REFLEX MICROSCOPIC
Bilirubin, UA: NEGATIVE
Glucose, UA: NEGATIVE
Ketones, UA: NEGATIVE
Leukocytes,UA: NEGATIVE
Nitrite, UA: NEGATIVE
Protein,UA: NEGATIVE
RBC, UA: NEGATIVE
Specific Gravity, UA: 1.02 (ref 1.005–1.030)
Urobilinogen, Ur: 0.2 mg/dL (ref 0.2–1.0)
pH, UA: 7 (ref 5.0–7.5)

## 2022-11-28 LAB — HEPATIC FUNCTION PANEL
ALT: 22 U/L (ref 0–53)
AST: 21 U/L (ref 0–37)
Albumin: 4.4 g/dL (ref 3.5–5.2)
Alkaline Phosphatase: 47 U/L (ref 39–117)
Bilirubin, Direct: 0.2 mg/dL (ref 0.0–0.3)
Total Bilirubin: 1.1 mg/dL (ref 0.2–1.2)
Total Protein: 7 g/dL (ref 6.0–8.3)

## 2022-11-28 LAB — LIPID PANEL
Cholesterol: 126 mg/dL (ref 0–200)
HDL: 33.9 mg/dL — ABNORMAL LOW
LDL Cholesterol: 66 mg/dL (ref 0–99)
NonHDL: 92.48
Total CHOL/HDL Ratio: 4
Triglycerides: 133 mg/dL (ref 0.0–149.0)
VLDL: 26.6 mg/dL (ref 0.0–40.0)

## 2022-11-28 MED ORDER — CEFTRIAXONE SODIUM 1 G IJ SOLR
1.0000 g | Freq: Once | INTRAMUSCULAR | Status: AC
  Administered 2022-11-28: 1 g via INTRAMUSCULAR

## 2022-11-28 NOTE — Progress Notes (Signed)
IM Injection  Patient is present today for an IM Injection for treatment of infection prevention post prostate biopsy Drug: Ceftriaxone Dose:1g Location:Right upper outer buttocks Lot: [id number] Exp:07/2024 Patient tolerated well, no complications were noted  Performed by: Arville Go CMA

## 2022-11-28 NOTE — Progress Notes (Signed)
Assessment: 1. Elevated PSA     Plan: Post biopsy instructions given Return to office in 7-10 days for biopsy results  Chief Complaint:  Chief Complaint  Patient presents with   Prostate Biopsy    History of Present Illness:  Kurt Mcdonald is a 57 y.o. male who is seen for further evaluation of elevated PSA.  PSA results: 4/22 6.91 5/22 6.42 10/24 9.90  No prior prostate biopsy.  No history of prostatitis or UTIs.  No family history of prostate cancer. He has noted some recent lower urinary tract symptoms with frequency, nocturia x 6, and hesitancy.  No dysuria or gross hematuria.  He was started on tamsulosin and noted improvement in his urinary symptoms. IPSS = 9 QOL = 3/6.  He presents today for prostate U/S and biopsy.  Portions of the above documentation were copied from a prior visit for review purposes only.    Past Medical History:  Past Medical History:  Diagnosis Date   Anxiety    Chicken pox    Chronic pain syndrome    Depression    Diverticulitis of colon without hemorrhage 06/04/2013   Diverticulosis of colon    Headache    History of pneumonia    Hypercholesteremia    Hypertension    Irritable bowel syndrome (IBS)    LBP (low back pain)    Male hypogonadism    NAFLD (nonalcoholic fatty liver disease)    Overweight(278.02)    Vision abnormalities     Past Surgical History:  Past Surgical History:  Procedure Laterality Date   shoulder sugery  01/2010   right   TOOTH EXTRACTION  11/2011   WISDOM TOOTH EXTRACTION      Allergies:  Allergies  Allergen Reactions   Butrans [Buprenorphine] Other (See Comments)    Felt funny   Crestor [Rosuvastatin Calcium] Other (See Comments)    Severe leg cramping   Fluoxetine Other (See Comments)    GI Upset (intolerance), Headache   Duloxetine Nausea Only   Sulfa Antibiotics Rash    Family History:  Family History  Problem Relation Age of Onset   Healthy Mother        Living   Pneumonia  Father 60       Deceased   Bone cancer Paternal Grandfather    Diabetes Maternal Grandfather    Healthy Brother    Healthy Son        x1   Healthy Daughter        x2    Social History:  Social History   Tobacco Use   Smoking status: Never   Smokeless tobacco: Never  Vaping Use   Vaping status: Never Used  Substance Use Topics   Alcohol use: Yes    Alcohol/week: 0.0 standard drinks of alcohol    Comment: Rare   Drug use: No    ROS: Constitutional:  Negative for fever, chills, weight loss CV: Negative for chest pain, previous MI, hypertension Respiratory:  Negative for shortness of breath, wheezing, sleep apnea, frequent cough GI:  Negative for nausea, vomiting, bloody stool, GERD  Physical exam: There were no vitals taken for this visit. GENERAL APPEARANCE:  Well appearing, well developed, well nourished, NAD HEENT:  Atraumatic, normocephalic, oropharynx clear NECK:  Supple without lymphadenopathy or thyromegaly ABDOMEN:  Soft, non-tender, no masses EXTREMITIES:  Moves all extremities well, without clubbing, cyanosis, or edema NEUROLOGIC:  Alert and oriented x 3, normal gait, CN II-XII grossly intact MENTAL STATUS:  appropriate BACK:  Non-tender to palpation, No CVAT SKIN:  Warm, dry, and intact   Results: U/A: negative  TRANSRECTAL ULTRASOUND AND PROSTATE BIOPSY  Indication:  Elevated PSA  Prophylactic antibiotic administration: Rocephin  All medications that could result in increased bleeding were discontinued within an appropriate period of the time of biopsy.  Risk including bleeding and infection were discussed.  Informed consent was obtained.  The patient was placed in the left lateral decubitus position.  PROCEDURE 1.  TRANSRECTAL ULTRASOUND OF THE PROSTATE  The 7 MHz transrectal probe was used to image the prostate.  Anal stenosis was not noted.  TRUS volume: 74.8 ml  Hypoechoic areas: None  Hyperechoic areas: None  Central calcifications:  present  Margins:  normal  Seminal Vesicles: normal   PROCEDURE 2:  PROSTATE BIOPSY  A periprostatic block was performed using 1% lidocaine and transrectal ultrasound guidance. Under transrectal ultrasound guidance, and using the Biopty gun, prostate biopsies were obtained systematically from the apex, mid gland, and base bilaterally.  A total of 12 cores were obtained.  Hemostasis was obtained with gentle pressure on the prostate.  The procedures were well-tolerated.  No significant bleeding was noted at the end of the procedure.  The patient was stable for discharge from the office.

## 2022-12-04 ENCOUNTER — Encounter: Payer: Self-pay | Admitting: Urology

## 2022-12-06 ENCOUNTER — Telehealth: Payer: Self-pay | Admitting: Urology

## 2022-12-06 NOTE — Telephone Encounter (Signed)
Prostate biopsy results showed benign prostate tissue, focal chronic inflammation, and 1 area of PIN. Results discussed with the patient by phone today. No problems following prostate biopsy. Recommend follow-up in 3-4 months for continued PSA monitoring.

## 2022-12-10 ENCOUNTER — Ambulatory Visit: Payer: BLUE CROSS/BLUE SHIELD | Admitting: Urology

## 2022-12-10 ENCOUNTER — Telehealth: Payer: Self-pay | Admitting: Urology

## 2022-12-10 NOTE — Telephone Encounter (Signed)
Left msg on pts voicemail to please return call.

## 2022-12-10 NOTE — Telephone Encounter (Signed)
Pt is still taking tamsulosin.

## 2022-12-10 NOTE — Telephone Encounter (Signed)
Pt is having difficulty peeing. and when he goes to the restroom it is hard to pee. It has gotten worse. Inflamation on where he did a biopsy on and didnt know if there was anything he could prescribe to help both of those issues.

## 2022-12-11 ENCOUNTER — Other Ambulatory Visit: Payer: Self-pay | Admitting: Urology

## 2022-12-11 MED ORDER — LEVOFLOXACIN 500 MG PO TABS: 500.0000 mg | ORAL_TABLET | Freq: Every day | ORAL | 0 refills | Status: DC

## 2022-12-11 NOTE — Telephone Encounter (Signed)
Pt notified and expressed understanding

## 2022-12-24 ENCOUNTER — Telehealth: Payer: Self-pay

## 2022-12-24 NOTE — Telephone Encounter (Signed)
Spoke with patient who reports having dark urine indicating blood in his urine. His prostate biopsy was 11/28/22. Advised pt that this could continue for a few more weeks. Also advised pt to push fluids and let us know if any thing changed.

## 2023-01-22 ENCOUNTER — Other Ambulatory Visit: Payer: Self-pay | Admitting: Family Medicine

## 2023-01-22 DIAGNOSIS — N401 Enlarged prostate with lower urinary tract symptoms: Secondary | ICD-10-CM

## 2023-02-23 ENCOUNTER — Other Ambulatory Visit: Payer: Self-pay | Admitting: Family Medicine

## 2023-02-23 DIAGNOSIS — G629 Polyneuropathy, unspecified: Secondary | ICD-10-CM

## 2023-03-07 ENCOUNTER — Encounter: Payer: Self-pay | Admitting: Family Medicine

## 2023-03-07 MED ORDER — SILDENAFIL CITRATE 100 MG PO TABS: ORAL_TABLET | ORAL | 3 refills | Status: DC

## 2023-03-26 ENCOUNTER — Encounter: Payer: Self-pay | Admitting: Urology

## 2023-03-26 ENCOUNTER — Ambulatory Visit (INDEPENDENT_AMBULATORY_CARE_PROVIDER_SITE_OTHER): Payer: BLUE CROSS/BLUE SHIELD | Admitting: Urology

## 2023-03-26 VITALS — BP 152/106 | HR 88 | Ht 76.0 in | Wt 265.0 lb

## 2023-03-26 DIAGNOSIS — R351 Nocturia: Secondary | ICD-10-CM

## 2023-03-26 DIAGNOSIS — R972 Elevated prostate specific antigen [PSA]: Secondary | ICD-10-CM

## 2023-03-26 DIAGNOSIS — N401 Enlarged prostate with lower urinary tract symptoms: Secondary | ICD-10-CM | POA: Diagnosis not present

## 2023-03-26 DIAGNOSIS — N138 Other obstructive and reflux uropathy: Secondary | ICD-10-CM

## 2023-03-26 LAB — URINALYSIS, ROUTINE W REFLEX MICROSCOPIC
Bilirubin, UA: NEGATIVE
Glucose, UA: NEGATIVE
Ketones, UA: NEGATIVE
Leukocytes,UA: NEGATIVE
Nitrite, UA: NEGATIVE
Protein,UA: NEGATIVE
RBC, UA: NEGATIVE
Specific Gravity, UA: 1.015 (ref 1.005–1.030)
Urobilinogen, Ur: 0.2 mg/dL (ref 0.2–1.0)
pH, UA: 7 (ref 5.0–7.5)

## 2023-03-26 MED ORDER — TAMSULOSIN HCL 0.4 MG PO CAPS: 0.4000 mg | ORAL_CAPSULE | Freq: Every day | ORAL | 11 refills | Status: AC

## 2023-03-26 NOTE — Progress Notes (Signed)
 Assessment: 1. Elevated PSA; negative biopsy 11/24   2. BPH with obstruction/lower urinary tract symptoms   3. Nocturia     Plan: 4K test today Will call with results and recommendations I discussed possible causes for nocturia with the patient today. Continue tamsulosin 0.4 mg - recommend taking in evening to see if this may improve his nocturia. Return to office in 6 months  Chief Complaint:  Chief Complaint  Patient presents with   Elevated PSA    History of Present Illness:  Kurt Mcdonald is a 58 y.o. male who is seen for further evaluation of elevated PSA.  PSA results: 4/22 6.91 5/22 6.42 10/24 9.90  No prior prostate biopsy.  No history of prostatitis or UTIs.  No family history of prostate cancer. He has noted some recent lower urinary tract symptoms with frequency, nocturia x 6, and hesitancy.  No dysuria or gross hematuria.  He was started on tamsulosin and noted improvement in his urinary symptoms. IPSS = 9 QOL = 3/6.  He underwent a  transrectal ultrasound and biopsy of the prostate on 11/28/22.Marland Kitchen PSA: 9.90 ng/ml TRUS volume:  74.8 ml  PSA density:  0.13 Biopsy results: Negative for malignancy, focal chronic inflammation, high-grade PIN and right apex  He returns today for scheduled follow-up.  He continues on tamsulosin 0.4 mg daily.  His primary urinary symptom is nocturia x 3.  He does not have any significant daytime symptoms.  No dysuria or gross hematuria. IPSS = 5/5 today.  Portions of the above documentation were copied from a prior visit for review purposes only.    Past Medical History:  Past Medical History:  Diagnosis Date   Anxiety    Chicken pox    Chronic pain syndrome    Depression    Diverticulitis of colon without hemorrhage 06/04/2013   Diverticulosis of colon    Headache    History of pneumonia    Hypercholesteremia    Hypertension    Irritable bowel syndrome (IBS)    LBP (low back pain)    Male hypogonadism    NAFLD  (nonalcoholic fatty liver disease)    Overweight(278.02)    Vision abnormalities     Past Surgical History:  Past Surgical History:  Procedure Laterality Date   shoulder sugery  01/2010   right   TOOTH EXTRACTION  11/2011   WISDOM TOOTH EXTRACTION      Allergies:  Allergies  Allergen Reactions   Butrans [Buprenorphine] Other (See Comments)    Felt funny   Crestor [Rosuvastatin Calcium] Other (See Comments)    Severe leg cramping   Fluoxetine Other (See Comments)    GI Upset (intolerance), Headache   Duloxetine Nausea Only   Sulfa Antibiotics Rash    Family History:  Family History  Problem Relation Age of Onset   Healthy Mother        Living   Pneumonia Father 13       Deceased   Bone cancer Paternal Grandfather    Diabetes Maternal Grandfather    Healthy Brother    Healthy Son        x1   Healthy Daughter        x2    Social History:  Social History   Tobacco Use   Smoking status: Never   Smokeless tobacco: Never  Vaping Use   Vaping status: Never Used  Substance Use Topics   Alcohol use: Yes    Alcohol/week: 0.0 standard drinks of alcohol  Comment: Rare   Drug use: No    ROS: Constitutional:  Negative for fever, chills, weight loss CV: Negative for chest pain, previous MI, hypertension Respiratory:  Negative for shortness of breath, wheezing, sleep apnea, frequent cough GI:  Negative for nausea, vomiting, bloody stool, GERD  Physical exam: BP (!) 152/106   Pulse 88   Ht 6\' 4"  (1.93 m)   Wt 265 lb (120.2 kg)   BMI 32.26 kg/m  GENERAL APPEARANCE:  Well appearing, well developed, well nourished, NAD HEENT:  Atraumatic, normocephalic, oropharynx clear NECK:  Supple without lymphadenopathy or thyromegaly ABDOMEN:  Soft, non-tender, no masses EXTREMITIES:  Moves all extremities well, without clubbing, cyanosis, or edema NEUROLOGIC:  Alert and oriented x 3, normal gait, CN II-XII grossly intact MENTAL STATUS:  appropriate BACK:  Non-tender to  palpation, No CVAT SKIN:  Warm, dry, and intact  Results: U/A: Negative

## 2023-03-26 NOTE — Addendum Note (Signed)
 Addended by: Milderd Meager on: 03/26/2023 11:16 AM   Modules accepted: Orders

## 2023-03-29 ENCOUNTER — Encounter: Payer: Self-pay | Admitting: Urology

## 2023-04-02 ENCOUNTER — Encounter: Payer: Self-pay | Admitting: Urology

## 2023-05-03 ENCOUNTER — Ambulatory Visit: Admitting: Family Medicine

## 2023-05-03 ENCOUNTER — Encounter: Payer: Self-pay | Admitting: Family Medicine

## 2023-05-03 VITALS — BP 142/86 | HR 86 | Temp 98.0°F | Resp 16 | Ht 76.0 in | Wt 277.6 lb

## 2023-05-03 DIAGNOSIS — F411 Generalized anxiety disorder: Secondary | ICD-10-CM

## 2023-05-03 DIAGNOSIS — R972 Elevated prostate specific antigen [PSA]: Secondary | ICD-10-CM

## 2023-05-03 DIAGNOSIS — E7800 Pure hypercholesterolemia, unspecified: Secondary | ICD-10-CM | POA: Diagnosis not present

## 2023-05-03 DIAGNOSIS — R918 Other nonspecific abnormal finding of lung field: Secondary | ICD-10-CM | POA: Diagnosis not present

## 2023-05-03 DIAGNOSIS — I10 Essential (primary) hypertension: Secondary | ICD-10-CM

## 2023-05-03 DIAGNOSIS — K219 Gastro-esophageal reflux disease without esophagitis: Secondary | ICD-10-CM

## 2023-05-03 MED ORDER — ATORVASTATIN CALCIUM 40 MG PO TABS: 40.0000 mg | ORAL_TABLET | Freq: Every day | ORAL | 3 refills | Status: DC

## 2023-05-03 MED ORDER — SILDENAFIL CITRATE 100 MG PO TABS: ORAL_TABLET | ORAL | 3 refills | Status: DC

## 2023-05-03 MED ORDER — SILDENAFIL CITRATE 100 MG PO TABS: ORAL_TABLET | ORAL | 5 refills | Status: DC

## 2023-05-03 MED ORDER — PANTOPRAZOLE SODIUM 40 MG PO TBEC: DELAYED_RELEASE_TABLET | ORAL | 1 refills | Status: DC

## 2023-05-03 NOTE — Patient Instructions (Addendum)
 Heat (pad or rice pillow in microwave) over affected area, 10-15 minutes twice daily.   Ice/cold pack over area for 10-15 min twice daily.  OK to take Tylenol  1000 mg (2 extra strength tabs) or 975 mg (3 regular strength tabs) every 6 hours as needed.  Consider a forearm strap (Band-IT) to help with your elbow. This can give your elbow a break and allow it to heal faster.  Keep the diet clean and stay active.  Give us  2-3 business days to get the results of your labs back.   Get off the energy drinks.   Check your blood pressures 2-3 times per week, alternating the time of day you check it. If it is high, considering waiting 1-2 minutes and rechecking. If it gets higher, your anxiety is likely creeping up and we should avoid rechecking.   I want your blood pressure less than 140 on the top and less than 90 on the bottom consistently. Both goals must be met (ie, 150/70 is too high even though the 70 on the bottom is desirable).   Elbow and Forearm Exercises It is normal to feel mild stretching, pulling, tightness, or discomfort as you do these exercises, but you should stop right away if you feel sudden pain or your pain gets worse.  RANGE OF MOTION EXERCISES These exercises warm up your muscles and joints and improve the movement and flexibility of your injured elbow and forearm. These exercises also help to relieve pain, numbness, and tingling. These exercises are done using the muscles in your injured elbow and forearm. Exercise A: Elbow Flexion, Active Hold your left / right arm at your side, and bend your elbow as far as you can using your left / right arm muscles. Hold this position for 30 seconds. Slowly return to the starting position. Repeat 2 times. Complete this exercise 3 times per week. Exercise B: Elbow Extension, Active Hold your left / right arm at your side, and straighten your elbow as much as you can using your left / right arm muscles. Hold this position for 30  seconds. Slowly return to the starting position. Repeat 2 times. Complete this exercise 3 times per week. Exercise C: Forearm Rotation, Supination, Active Stand or sit with your elbows at your sides. Bend your left / right elbow to an "L" shape (90 degrees). Turn your palm upward until you feel a gentle stretch on the inside of your forearm. Hold this position for 30 seconds. Slowly release and return to the starting position. Repeat 2 times. Complete this exercise 3 times per week. Exercise D: Forearm Rotation, Pronation, Active Stand or sit with your elbows at your side. Bend your left / right elbow to an "L" shape (90 degrees). Turn your left / right palm downward until you feel a gentle stretch on the top of your forearm. Hold this position for 30 seconds. Slowly release and return to the starting position. Repeat 2 times. Complete this exercise 3 times per week. STRETCHING EXERCISES These exercises warm up your muscles and joints and improve the movement and flexibility of your injured elbow and forearm. These exercises also help to relieve pain, numbness, and tingling. These exercises are done using your healthy elbow and forearm to help stretch the muscles in your injured elbow and forearm. Exercise E: Elbow Flexion, Active-Assisted  Hold your left / right arm at your side, and bend your elbow as much as you can using your left / right arm muscles. Use your other hand  to bend your left / right elbow farther. To do this, gently push up on your forearm until you feel a gentle stretch on the back of your elbow. Hold this position for 30 seconds. Slowly return to the starting position. Repeat 2 times. Complete this exercise 3 times per week. Exercise F: Elbow Extension, Active-Assisted  Hold your left / right arm at your side, and straighten your elbow as much as you can using your left / right arm muscles. Use your other hand to straighten the left / right elbow farther. To do this,  gently push down on your forearm until you feel a gentle stretch on the inside of your elbow. Hold this position for 30 seconds. Slowly return to the starting position. Repeat 2 times. Complete this exercise 3 times per weeky. Exercise G: Forearm Rotation, Supination, Active-Assisted  Sit with your left / right elbow bent in an "L" shape (90 degrees) with your forearm resting on a table. Keeping your upper body and shoulder still, rotate your forearm so your left / right palm faces upward. Use your other hand to help rotate your forearm further until you feel a gentle to moderate stretch. Hold this position for 30 seconds. Slowly release the stretch and return to the starting position. Repeat 2 times. Complete this exercise 3 times per week. Exercise H: Forearm Rotation, Pronation, Active-Assisted  Sit with your left / right elbow bent in an "L" shape (90 degrees) with your forearm resting on a table. Keeping your upper body and shoulder still, rotate your forearm so your palm faces the tabletop. Use your other hand to help rotate your forearm further until you feel a gentle to moderate stretch. Hold this position for 30 seconds. Slowly release the stretch and return to the starting position. Repeat 2 times. Complete this exercise 3 times per week. Exercise I: Elbow Flexion, Supine, Passive Lie on your back. Extend your left / right arm up in the air, bracing it with your other hand. Let your left / right your hand slowly lower toward your shoulder, while your elbow stays pointed toward the ceiling. You should feel a gentle stretch along the back of your upper arm and elbow. If instructed by your health care provider, you may increase the intensity of your stretch by adding a small wrist weight or hand weight. Hold this position for 3 seconds. Slowly return to the starting position. Repeat 2 times. Complete this exercise 3 times per week. Exercise J: Elbow Extension, Supine,  Passive  Lie on your back. Make sure that you are in a comfortable position that lets you relax your arm muscles. Place a folded towel under your left / right upper arm so your elbow and shoulder are at the same height. Straighten your left / right arm so your elbow does not rest on the bed or towel. Let the weight of your hand stretch your elbow. Keep your arm and chest muscles relaxed. You should feel a stretch on the inside of your elbow. If told by your health care provider, you may increase the intensity of your stretch by adding a small wrist weight or hand weight. Hold this position for 30 seconds. Slowly release the stretch. Repeat 2 times. Complete this exercise 3 times per week. STRENGTHENING EXERCISES These exercises build strength and endurance in your elbow and forearm. Endurance is the ability to use your muscles for a long time, even after they get tired. Exercise K: Elbow Flexion, Isometric  Stand or sit up  straight. Bend your left / right elbow in an "L" shape (90 degrees) and turn your palm up so your forearm is at the height of your waist. Place your other hand on top of your forearm. Gently push down as your left / right arm resists. Push as hard as you can with both arms without causing any pain or movement at your left / right elbow. Hold this position for 3 seconds. Slowly release the tension in both arms. Let your muscles relax completely before repeating. Repeat 2 times. Complete this exercise 3 times per week. Exercise L: Elbow Extensors, Isometric  Stand or sit up straight. Place your left / right arm so your palm faces your abdomen and it is at the height of your waist. Place your other hand on the underside of your forearm. Gently push up as your left / right arm resists. Push as hard as you can with both arms, without causing any pain or movement at your left / right elbow. Hold this position for 3 seconds. Slowly release the tension in both arms. Let your  muscles relax completely before repeating. Repeat _______2___ times. Complete this exercise 3 times per week. Exercise M: Elbow Flexion With Forearm Palm Up  Sit upright on a firm chair without armrests, or stand. Place your left / right arm at your side with your palm facing forward. Holding a 5 lbweight or gripping a rubber exercise band or tubing, bend your elbow to bring your hand toward your shoulder. Hold this position for 3 seconds. Slowly return to the starting position. Repeat 2 times. Complete this exercise 3 times per week. Exercise N: Elbow Extension  Sit on a firm chair without armrests, or stand. Keeping your upper arms at your sides, bring both hands up toward your left / right shoulder while you grip a rubber exercise band or tubing. Your left / right hand should be just below the other hand. Straighten your left / right elbow. Hold this position for 3 seconds. Control the resistance of the band or tubing as your hand returns to your side. Repeat 2 times. Complete this exercise 3 times per week. Exercise O: Forearm Rotation, Supination  Sit with your left / right forearm supported on a table. Keep your elbow at waist height. Rest your hand over the edge of the table with your palm facing down. Gently hold a lightweight hammer. Without moving your elbow, slowly rotate your forearm to turn your palm and hand upward to a "thumbs-up" position. Hold this position for 3 seconds. Slowly return to the starting position. Repeat 2 times. Complete this exercise 3 times per week. Exercise P: Forearm Rotation, Pronation  Sit with your left / right forearm supported on a table. Keep your elbow below shoulder height. Rest your hand over the edge of the table with your palm facing up. Gently hold a lightweight hammer. Without moving your elbow, slowly rotate your forearm to turn your palm and hand upward to a "thumbs-up" position. Hold this position for 3 seconds. Slowly return to  the starting position. Repeat 2 times. Complete this exercise 3 times per week.  Make sure you discuss any questions you have with your health care provider. Document Released: 11/01/2004 Document Revised: 04/28/2015 Document Reviewed: 09/12/2014 Elsevier Interactive Patient Education  [employer].

## 2023-05-03 NOTE — Progress Notes (Signed)
 Chief Complaint  Patient presents with   Follow-up    Follow up    Subjective: Hyperlipidemia Patient presents for hyperlipidemia follow up. Currently taking Lipitor 40 mg/d and compliance with treatment thus far has been good. He denies myalgias. He is usually adhering to a healthy diet. Exercise: walking, strength training No CP or SOB.  The patient is not known to have coexisting coronary artery disease.  Anxiety-patient has a history of anxiety taking Klonopin  0.5-1 mg daily as needed.  He uses them infrequently.  No adverse effects.  He is not seeing a therapist or counselor.  No self-medication.   Past Medical History:  Diagnosis Date   Anxiety    Chicken pox    Chronic pain syndrome    Depression    Diverticulitis of colon without hemorrhage 06/04/2013   Diverticulosis of colon    Headache    History of pneumonia    Hypercholesteremia    Hypertension    Irritable bowel syndrome (IBS)    LBP (low back pain)    Male hypogonadism    NAFLD (nonalcoholic fatty liver disease)    Overweight(278.02)    Vision abnormalities     Objective: BP (!) 142/86 (BP Location: Left Arm, Patient Position: Sitting)   Pulse 86   Temp 98 F (36.7 C) (Oral)   Resp 16   Ht 6\' 4"  (1.93 m)   Wt 277 lb 9.6 oz (125.9 kg)   SpO2 98%   BMI 33.79 kg/m  General: Awake, appears stated age HEENT: MMM Heart: RRR, no LE edema, no bruits Lungs: CTAB, no rales, wheezes or rhonchi. No accessory muscle use Psych: Age appropriate judgment and insight, normal affect and mood  Assessment and Plan: GAD (generalized anxiety disorder)  Hypercholesteremia - Plan: Comprehensive metabolic panel with GFR, Lipid panel  Abnormal finding on lung imaging - Plan: CT Chest W Contrast  Elevated PSA - Plan: PSA, CANCELED: PSA  Gastroesophageal reflux disease without esophagitis - Plan: pantoprazole  (PROTONIX ) 40 MG tablet  Essential hypertension  Chronic, stable.  Continue Klonopin  as  needed. Chronic, stable.  Continue Lipitor 40 mg daily.  Counseled on diet and exercise. Will set up a CT chest per radiology recommendations to be scheduled in the next several months. Check PSA. Continue Protonix  40 mg daily as needed. Blood pressure elevated today.  He has been consuming a lot of energy drinks.  He will monitor blood pressure at home and decrease these.  If elevated, he will reach out to us . F/u in 6 months. The patient voiced understanding and agreement to the plan.  Shellie Dials [place], DO 05/03/23  3:36 PM

## 2023-05-04 ENCOUNTER — Encounter: Payer: Self-pay | Admitting: Family Medicine

## 2023-05-04 LAB — LIPID PANEL
Cholesterol: 215 mg/dL — ABNORMAL HIGH (ref <200)
HDL: 45 mg/dL (ref >=40)
LDL Cholesterol (Calc): 145 mg/dL — ABNORMAL HIGH
Non-HDL Cholesterol (Calc): 170 mg/dL — ABNORMAL HIGH (ref <130)
Total CHOL/HDL Ratio: 4.8 (calc) (ref <5.0)
Triglycerides: 123 mg/dL (ref <150)

## 2023-05-04 LAB — COMPREHENSIVE METABOLIC PANEL WITH GFR
AG Ratio: 1.6 (calc) (ref 1.0–2.5)
ALT: 61 U/L — ABNORMAL HIGH (ref 9–46)
AST: 37 U/L — ABNORMAL HIGH (ref 10–35)
Albumin: 4.6 g/dL (ref 3.6–5.1)
Alkaline phosphatase (APISO): 40 U/L (ref 35–144)
BUN/Creatinine Ratio: 10 (calc) (ref 6–22)
BUN: 15 mg/dL (ref 7–25)
CO2: 27 mmol/L (ref 20–32)
Calcium: 10.3 mg/dL (ref 8.6–10.3)
Chloride: 102 mmol/L (ref 98–110)
Creat: 1.56 mg/dL — ABNORMAL HIGH (ref 0.70–1.30)
Globulin: 2.9 g/dL (ref 1.9–3.7)
Glucose, Bld: 91 mg/dL (ref 65–99)
Potassium: 4.3 mmol/L (ref 3.5–5.3)
Sodium: 140 mmol/L (ref 135–146)
Total Bilirubin: 0.8 mg/dL (ref 0.2–1.2)
Total Protein: 7.5 g/dL (ref 6.1–8.1)
eGFR: 51 mL/min/{1.73_m2} — ABNORMAL LOW (ref >=60)

## 2023-05-04 LAB — PSA: PSA: 6.92 ng/mL — ABNORMAL HIGH (ref <=4.00)

## 2023-05-06 ENCOUNTER — Other Ambulatory Visit: Payer: Self-pay

## 2023-05-06 DIAGNOSIS — E7800 Pure hypercholesterolemia, unspecified: Secondary | ICD-10-CM

## 2023-05-06 DIAGNOSIS — R748 Abnormal levels of other serum enzymes: Secondary | ICD-10-CM

## 2023-06-11 ENCOUNTER — Other Ambulatory Visit

## 2023-06-14 ENCOUNTER — Other Ambulatory Visit (INDEPENDENT_AMBULATORY_CARE_PROVIDER_SITE_OTHER)

## 2023-06-14 DIAGNOSIS — E7800 Pure hypercholesterolemia, unspecified: Secondary | ICD-10-CM | POA: Diagnosis not present

## 2023-06-14 DIAGNOSIS — R748 Abnormal levels of other serum enzymes: Secondary | ICD-10-CM

## 2023-06-14 NOTE — Addendum Note (Signed)
 Addended by: Susa Engman A on: 06/14/2023 10:39 AM   Modules accepted: Orders

## 2023-06-15 ENCOUNTER — Ambulatory Visit: Payer: Self-pay | Admitting: Family Medicine

## 2023-06-15 LAB — LIPID PANEL
Cholesterol: 125 mg/dL (ref <200)
HDL: 32 mg/dL — ABNORMAL LOW (ref >=40)
LDL Cholesterol (Calc): 72 mg/dL
Non-HDL Cholesterol (Calc): 93 mg/dL (ref <130)
Total CHOL/HDL Ratio: 3.9 (calc) (ref <5.0)
Triglycerides: 131 mg/dL (ref <150)

## 2023-06-15 LAB — HEPATIC FUNCTION PANEL
AG Ratio: 1.7 (calc) (ref 1.0–2.5)
ALT: 36 U/L (ref 9–46)
AST: 21 U/L (ref 10–35)
Albumin: 4.4 g/dL (ref 3.6–5.1)
Alkaline phosphatase (APISO): 42 U/L (ref 35–144)
Bilirubin, Direct: 0.2 mg/dL (ref 0.0–0.2)
Globulin: 2.6 g/dL (ref 1.9–3.7)
Indirect Bilirubin: 0.9 mg/dL (ref 0.2–1.2)
Total Bilirubin: 1.1 mg/dL (ref 0.2–1.2)
Total Protein: 7 g/dL (ref 6.1–8.1)

## 2023-06-26 ENCOUNTER — Encounter: Payer: Self-pay | Admitting: Family Medicine

## 2023-07-01 ENCOUNTER — Encounter: Payer: Self-pay | Admitting: Family Medicine

## 2023-07-03 ENCOUNTER — Ambulatory Visit

## 2023-07-03 NOTE — Telephone Encounter (Signed)
 Called pt was advised he can come today for nurse visit to received tetanus shot. Nurse visit appt schedule.

## 2023-07-30 ENCOUNTER — Ambulatory Visit: Admitting: Family Medicine

## 2023-07-30 ENCOUNTER — Encounter: Payer: Self-pay | Admitting: Family Medicine

## 2023-07-30 VITALS — BP 128/84 | HR 100 | Temp 98.0°F | Resp 16 | Ht 76.0 in | Wt 275.0 lb

## 2023-07-30 DIAGNOSIS — R944 Abnormal results of kidney function studies: Secondary | ICD-10-CM | POA: Diagnosis not present

## 2023-07-30 DIAGNOSIS — G4733 Obstructive sleep apnea (adult) (pediatric): Secondary | ICD-10-CM | POA: Diagnosis not present

## 2023-07-30 NOTE — Patient Instructions (Signed)
Give us 2-3 business days to get the results of your labs back.   Keep the diet clean and stay active.  If you do not hear anything about your referral in the next 1-2 weeks, call our office and ask for an update.  Let us know if you need anything. 

## 2023-07-30 NOTE — Progress Notes (Signed)
 Chief Complaint  Patient presents with   Follow-up    Follow Up Kidney    Subjective: Patient is a 58 y.o. male here for follow-up.  Patient had a life insurance evaluation and was told his kidney function was low.  GFR has been in the 50s.  He stays well-hydrated.  He does not lift weights routinely.  He is not on any anti-inflammatories.  Patient has a history of OSA recommended to be on CPAP.  Due to claustrophobia, he was not able to do this.  His last sleep study was several years ago.  He is interested in getting set up/evaluated again.  Past Medical History:  Diagnosis Date   Anxiety    Chicken pox    Chronic pain syndrome    Depression    Diverticulitis of colon without hemorrhage 06/04/2013   Diverticulosis of colon    Headache    History of pneumonia    Hypercholesteremia    Hypertension    Irritable bowel syndrome (IBS)    LBP (low back pain)    Male hypogonadism    NAFLD (nonalcoholic fatty liver disease)    Overweight(278.02)    Vision abnormalities     Objective: BP 128/84 (BP Location: Left Arm, Patient Position: Sitting)   Pulse 100   Temp 98 F (36.7 C) (Oral)   Resp 16   Ht 6' 4 (1.93 m)   Wt 275 lb (124.7 kg)   SpO2 95%   BMI 33.47 kg/m  General: Awake, appears stated age Heart: RRR, no LE edema Lungs: CTAB, no rales, wheezes or rhonchi. No accessory muscle use Psych: Age appropriate judgment and insight, normal affect and mood  Assessment and Plan: Decreased GFR - Plan: Basic metabolic panel with GFR, Microalbumin / creatinine urine ratio  OSA (obstructive sleep apnea) - Plan: Ambulatory referral to Neurology  Recheck renal function and for proteinuria.  Consider low-dose ARB for renal protection.  Stay hydrated. Refer to neurology for their opinion.  He will probably need another sleep study pending insurance requirements. The patient voiced understanding and agreement to the plan.  Mabel [place], DO 07/30/23  3:44  PM

## 2023-07-31 ENCOUNTER — Ambulatory Visit: Payer: Self-pay | Admitting: Family Medicine

## 2023-07-31 LAB — MICROALBUMIN / CREATININE URINE RATIO
Creatinine,U: 205 mg/dL
Microalb Creat Ratio: 8.7 mg/g (ref 0.0–30.0)
Microalb, Ur: 1.8 mg/dL (ref 0.0–1.9)

## 2023-07-31 LAB — BASIC METABOLIC PANEL WITH GFR
BUN: 12 mg/dL (ref 6–23)
CO2: 28 meq/L (ref 19–32)
Calcium: 9.5 mg/dL (ref 8.4–10.5)
Chloride: 102 meq/L (ref 96–112)
Creatinine, Ser: 1.52 mg/dL — ABNORMAL HIGH (ref 0.40–1.50)
GFR: 50.34 mL/min — ABNORMAL LOW (ref >60.00)
Glucose, Bld: 82 mg/dL (ref 70–99)
Potassium: 4.4 meq/L (ref 3.5–5.1)
Sodium: 139 meq/L (ref 135–145)

## 2023-08-07 ENCOUNTER — Other Ambulatory Visit: Payer: Self-pay | Admitting: Family Medicine

## 2023-08-15 ENCOUNTER — Encounter: Payer: Self-pay | Admitting: Family Medicine

## 2023-08-15 NOTE — Telephone Encounter (Signed)
 Please advise pt was dismissed from practice he was referred to

## 2023-09-03 NOTE — Telephone Encounter (Signed)
 Please resend

## 2023-09-26 ENCOUNTER — Ambulatory Visit: Admitting: Urology

## 2023-10-01 ENCOUNTER — Ambulatory Visit: Admitting: Family Medicine

## 2023-10-02 ENCOUNTER — Encounter: Payer: Self-pay | Admitting: Family Medicine

## 2023-10-02 ENCOUNTER — Ambulatory Visit: Admitting: Family Medicine

## 2023-10-02 VITALS — BP 140/88 | HR 84 | Temp 97.5°F | Resp 16 | Ht 76.0 in

## 2023-10-02 DIAGNOSIS — F411 Generalized anxiety disorder: Secondary | ICD-10-CM

## 2023-10-02 DIAGNOSIS — N1831 Chronic kidney disease, stage 3a: Secondary | ICD-10-CM

## 2023-10-02 DIAGNOSIS — Z131 Encounter for screening for diabetes mellitus: Secondary | ICD-10-CM | POA: Diagnosis not present

## 2023-10-02 DIAGNOSIS — I10 Essential (primary) hypertension: Secondary | ICD-10-CM

## 2023-10-02 DIAGNOSIS — K439 Ventral hernia without obstruction or gangrene: Secondary | ICD-10-CM | POA: Diagnosis not present

## 2023-10-02 MED ORDER — OLMESARTAN MEDOXOMIL 20 MG PO TABS: 20.0000 mg | ORAL_TABLET | Freq: Every day | ORAL | 2 refills | Status: DC

## 2023-10-02 MED ORDER — PROPRANOLOL HCL 10 MG PO TABS: ORAL_TABLET | ORAL | 1 refills | Status: DC

## 2023-10-02 NOTE — Patient Instructions (Signed)
 Give us  2-3 business days to get the results of your labs back.   Keep the diet clean and stay active.  Let us  know if you need anything.

## 2023-10-02 NOTE — Progress Notes (Signed)
 Chief Complaint  Patient presents with   Mass    Mass  Abdominal Area    Subjective: Patient is a 59 y.o. male here for f/u.  Patient was recently found to have a GFR less than 60 on 2 occasions.  No history of diabetes.  He does have a history of high blood pressure.  He is not currently taking any medication.  He does not monitor his blood pressure at home.  Diet is okay.  He tries to stay active.  No chest pain or shortness of breath.  A few weeks ago, a partner of his noticed a bulge above his bellybutton.  There is no skin change, bowel changes, or routine pain.  Patient has a history of anxiety.  He takes Klonopin  as needed.  He is not seeing a therapist or counselor.  No homicidal or suicidal ideation.  No self-medication.  He is interested in trialing propranolol  to see if it helps decrease his Klonopin  usage.  Past Medical History:  Diagnosis Date   Anxiety    Chicken pox    Chronic pain syndrome    Depression    Diverticulitis of colon without hemorrhage 06/04/2013   Diverticulosis of colon    Headache    History of pneumonia    Hypercholesteremia    Hypertension    Irritable bowel syndrome (IBS)    LBP (low back pain)    Male hypogonadism    NAFLD (nonalcoholic fatty liver disease)    Overweight(278.02)    Vision abnormalities     Objective: BP (!) 140/88 (BP Location: Left Arm, Cuff Size: Large)   Pulse 84   Temp (!) 97.5 F (36.4 C) (Oral)   Resp 16   Ht 6' 4 (1.93 m)   SpO2 98%   BMI 33.47 kg/m  General: Awake, appears stated age Heart: RRR, no LE edema Lungs: CTAB, no rales, wheezes or rhonchi. No accessory muscle use Abdomen: Bowel sounds present, soft, nontender, nondistended.  There is a small bulge superior to the umbilicus.  It enlarges upon Valsalva. Psych: Age appropriate judgment and insight, normal affect and mood  Assessment and Plan: GAD (generalized anxiety disorder) - Plan: propranolol  (INDERAL ) 10 MG tablet  CKD stage 3a, GFR 45-59  ml/min (HCC) - Plan: olmesartan (BENICAR) 20 MG tablet, Basic metabolic panel with GFR, Basic metabolic panel with GFR  Screening for diabetes mellitus - Plan: Hemoglobin A1c, Hemoglobin A1c  Ventral hernia without obstruction or gangrene  Essential hypertension  Patient requested to trial propranolol  as it is less addictive than Klonopin .  Hopefully will allow him to stop taking this. Chronic, not controlled.  Start olmesartan 20 mg daily.  Check BMP next week.  Stay hydrated. Check above. Offered referral to general surgery.  He would like to hold off for now but will let us  know if he changes his mind. Counseled on diet and exercise.  Start olmesartan as above.  I will see him in 1 month to recheck.  Monitor blood pressure at home. The patient voiced understanding and agreement to the plan.  Mabel Mt [place], DO 10/02/23  12:36 PM

## 2023-10-03 ENCOUNTER — Ambulatory Visit: Payer: Self-pay | Admitting: Family Medicine

## 2023-10-03 LAB — BASIC METABOLIC PANEL WITH GFR
BUN: 12 mg/dL (ref 6–23)
CO2: 28 meq/L (ref 19–32)
Calcium: 9.4 mg/dL (ref 8.4–10.5)
Chloride: 102 meq/L (ref 96–112)
Creatinine, Ser: 1.47 mg/dL (ref 0.40–1.50)
GFR: 52.34 mL/min — ABNORMAL LOW (ref >60.00)
Glucose, Bld: 84 mg/dL (ref 70–99)
Potassium: 4.5 meq/L (ref 3.5–5.1)
Sodium: 139 meq/L (ref 135–145)

## 2023-10-03 LAB — HEMOGLOBIN A1C: Hgb A1c MFr Bld: 6.3 % (ref 4.6–6.5)

## 2023-10-08 ENCOUNTER — Telehealth: Payer: Self-pay | Admitting: Family Medicine

## 2023-10-08 ENCOUNTER — Other Ambulatory Visit

## 2023-10-08 ENCOUNTER — Other Ambulatory Visit (INDEPENDENT_AMBULATORY_CARE_PROVIDER_SITE_OTHER)

## 2023-10-08 ENCOUNTER — Other Ambulatory Visit: Payer: Self-pay

## 2023-10-08 DIAGNOSIS — N1831 Chronic kidney disease, stage 3a: Secondary | ICD-10-CM

## 2023-10-08 LAB — BASIC METABOLIC PANEL WITH GFR
BUN/Creatinine Ratio: 10 (calc) (ref 6–22)
BUN: 17 mg/dL (ref 7–25)
CO2: 27 mmol/L (ref 20–32)
Calcium: 9.3 mg/dL (ref 8.6–10.3)
Chloride: 103 mmol/L (ref 98–110)
Creat: 1.66 mg/dL — ABNORMAL HIGH (ref 0.70–1.30)
Glucose, Bld: 97 mg/dL (ref 65–99)
Potassium: 4.8 mmol/L (ref 3.5–5.3)
Sodium: 139 mmol/L (ref 135–146)
eGFR: 47 mL/min/1.73m2 — ABNORMAL LOW (ref >=60)

## 2023-10-08 NOTE — Telephone Encounter (Signed)
 This patient is on the lab schedule does he need lab orders

## 2023-10-09 ENCOUNTER — Ambulatory Visit: Payer: Self-pay | Admitting: Family Medicine

## 2023-10-11 ENCOUNTER — Ambulatory Visit: Admitting: Urology

## 2023-10-11 ENCOUNTER — Encounter: Payer: Self-pay | Admitting: Urology

## 2023-10-11 VITALS — BP 146/84 | HR 80 | Ht 76.0 in | Wt 275.0 lb

## 2023-10-11 DIAGNOSIS — N138 Other obstructive and reflux uropathy: Secondary | ICD-10-CM | POA: Diagnosis not present

## 2023-10-11 DIAGNOSIS — R972 Elevated prostate specific antigen [PSA]: Secondary | ICD-10-CM | POA: Diagnosis not present

## 2023-10-11 DIAGNOSIS — R351 Nocturia: Secondary | ICD-10-CM | POA: Diagnosis not present

## 2023-10-11 DIAGNOSIS — N401 Enlarged prostate with lower urinary tract symptoms: Secondary | ICD-10-CM | POA: Diagnosis not present

## 2023-10-11 LAB — URINALYSIS, ROUTINE W REFLEX MICROSCOPIC
Bilirubin, UA: NEGATIVE
Glucose, UA: NEGATIVE
Ketones, UA: NEGATIVE
Leukocytes,UA: NEGATIVE
Nitrite, UA: NEGATIVE
Protein,UA: NEGATIVE
RBC, UA: NEGATIVE
Specific Gravity, UA: 1.02 (ref 1.005–1.030)
Urobilinogen, Ur: 0.2 mg/dL (ref 0.2–1.0)
pH, UA: 6.5 (ref 5.0–7.5)

## 2023-10-11 NOTE — Progress Notes (Signed)
 Assessment: 1. Elevated PSA; negative biopsy 11/24   2. BPH with obstruction/lower urinary tract symptoms   3. Nocturia     Plan: Free and total PSA today Continue tamsulosin  0.4 mg  Return to office in 6 months  Chief Complaint:  Chief Complaint  Patient presents with   Elevated PSA   Benign Prostatic Hypertrophy    History of Present Illness:  Kurt Mcdonald is a 58 y.o. male who is seen for further evaluation of elevated PSA and BPH with LUTS.  PSA results: 4/22 6.91 5/22 6.42 10/24 9.90  No prior prostate biopsy.  No history of prostatitis or UTIs.  No family history of prostate cancer. He has noted some recent lower urinary tract symptoms with frequency, nocturia x 6, and hesitancy.  No dysuria or gross hematuria.  He was started on tamsulosin  and noted improvement in his urinary symptoms. IPSS = 9/3  He underwent a  transrectal ultrasound and biopsy of the prostate on 11/28/22.SABRA PSA: 9.90 ng/ml TRUS volume:  74.8 ml  PSA density:  0.13 Biopsy results: Negative for malignancy, focal chronic inflammation, high-grade PIN and right apex  4K test 3/25:  PSA 8.22, free PSA 23%, 4K score 1.7 indicating low risk of aggressive prostate cancer PSA 5/25:  6.92  At his visit in March 2025, he continued on tamsulosin  0.4 mg daily.  His primary urinary symptom was nocturia x 3.  He did not have any significant daytime symptoms.  No dysuria or gross hematuria. IPSS = 5/5.  He returns today for follow-up.  He continues on tamsulosin  0.4 mg daily.  His lower urinary tract symptoms have improved.  He has nocturia x 2.  No dysuria or gross hematuria. IPSS = 4/1.  Portions of the above documentation were copied from a prior visit for review purposes only.   Past Medical History:  Past Medical History:  Diagnosis Date   Anxiety    Chicken pox    Chronic pain syndrome    Depression    Diverticulitis of colon without hemorrhage 06/04/2013   Diverticulosis of colon     Headache    History of pneumonia    Hypercholesteremia    Hypertension    Irritable bowel syndrome (IBS)    LBP (low back pain)    Male hypogonadism    NAFLD (nonalcoholic fatty liver disease)    Overweight(278.02)    Vision abnormalities     Past Surgical History:  Past Surgical History:  Procedure Laterality Date   shoulder sugery  01/2010   right   TOOTH EXTRACTION  11/2011   WISDOM TOOTH EXTRACTION      Allergies:  Allergies  Allergen Reactions   Butrans  [Buprenorphine ] Other (See Comments)    Felt funny   Crestor  [Rosuvastatin  Calcium ] Other (See Comments)    Severe leg cramping   Fluoxetine  Other (See Comments)    GI Upset (intolerance), Headache   Duloxetine  Nausea Only   Sulfa Antibiotics Rash    Family History:  Family History  Problem Relation Age of Onset   Healthy Mother        Living   Pneumonia Father 32       Deceased   Bone cancer Paternal Grandfather    Diabetes Maternal Grandfather    Healthy Brother    Healthy Son        x1   Healthy Daughter        x2    Social History:  Social History   Tobacco Use  Smoking status: Never   Smokeless tobacco: Never  Vaping Use   Vaping status: Never Used  Substance Use Topics   Alcohol use: Yes    Alcohol/week: 0.0 standard drinks of alcohol    Comment: Rare   Drug use: No    ROS: Constitutional:  Negative for fever, chills, weight loss CV: Negative for chest pain, previous MI, hypertension Respiratory:  Negative for shortness of breath, wheezing, sleep apnea, frequent cough GI:  Negative for nausea, vomiting, bloody stool, GERD  Physical exam: BP (!) 146/84   Pulse 80   Ht 6' 4 (1.93 m)   Wt 275 lb (124.7 kg)   BMI 33.47 kg/m  GENERAL APPEARANCE:  Well appearing, well developed, well nourished, NAD HEENT:  Atraumatic, normocephalic, oropharynx clear NECK:  Supple without lymphadenopathy or thyromegaly ABDOMEN:  Soft, non-tender, no masses EXTREMITIES:  Moves all extremities well,  without clubbing, cyanosis, or edema NEUROLOGIC:  Alert and oriented x 3, normal gait, CN II-XII grossly intact MENTAL STATUS:  appropriate BACK:  Non-tender to palpation, No CVAT SKIN:  Warm, dry, and intact GU:  Prostate: 60 g, NT, no nodules Rectum: Normal tone,  no masses or tenderness   Results: U/A: Negative

## 2023-10-12 LAB — PSA, TOTAL AND FREE
PSA, Free Pct: 24.1 %
PSA, Free: 1.71 ng/mL
Prostate Specific Ag, Serum: 7.1 ng/mL — ABNORMAL HIGH (ref 0.0–4.0)

## 2023-10-14 ENCOUNTER — Ambulatory Visit: Payer: Self-pay | Admitting: Urology

## 2023-11-01 ENCOUNTER — Other Ambulatory Visit: Payer: Self-pay | Admitting: Family Medicine

## 2023-11-01 DIAGNOSIS — F411 Generalized anxiety disorder: Secondary | ICD-10-CM

## 2023-11-04 ENCOUNTER — Encounter: Payer: Self-pay | Admitting: Family Medicine

## 2023-11-04 ENCOUNTER — Ambulatory Visit: Admitting: Family Medicine

## 2023-11-04 ENCOUNTER — Ambulatory Visit: Payer: Self-pay | Admitting: Family Medicine

## 2023-11-04 VITALS — BP 138/84 | HR 60 | Temp 98.0°F | Resp 16 | Ht 76.0 in | Wt 288.4 lb

## 2023-11-04 DIAGNOSIS — I10 Essential (primary) hypertension: Secondary | ICD-10-CM | POA: Diagnosis not present

## 2023-11-04 DIAGNOSIS — N1831 Chronic kidney disease, stage 3a: Secondary | ICD-10-CM | POA: Diagnosis not present

## 2023-11-04 DIAGNOSIS — Z23 Encounter for immunization: Secondary | ICD-10-CM | POA: Diagnosis not present

## 2023-11-04 DIAGNOSIS — Z79899 Other long term (current) drug therapy: Secondary | ICD-10-CM | POA: Diagnosis not present

## 2023-11-04 DIAGNOSIS — F411 Generalized anxiety disorder: Secondary | ICD-10-CM | POA: Diagnosis not present

## 2023-11-04 LAB — BASIC METABOLIC PANEL WITH GFR
BUN: 15 mg/dL (ref 6–23)
CO2: 30 meq/L (ref 19–32)
Calcium: 8.9 mg/dL (ref 8.4–10.5)
Chloride: 102 meq/L (ref 96–112)
Creatinine, Ser: 1.58 mg/dL — ABNORMAL HIGH (ref 0.40–1.50)
GFR: 47.97 mL/min — ABNORMAL LOW (ref >60.00)
Glucose, Bld: 70 mg/dL (ref 70–99)
Potassium: 4.7 meq/L (ref 3.5–5.1)
Sodium: 139 meq/L (ref 135–145)

## 2023-11-04 NOTE — Patient Instructions (Signed)
 When you do wash it, use only soap and water. Do not vigorously scrub. Apply triple antibiotic ointment (like Neosporin) twice daily. Keep the area clean and dry.   Things to look out for: increasing pain not relieved by ibuprofen/acetaminophen , fevers, spreading redness, drainage of pus, or foul odor.  Give us  2-3 business days to get the results of your labs back.   Keep the diet clean and stay active.  Let us  know if you need anything.

## 2023-11-04 NOTE — Progress Notes (Signed)
 Chief Complaint  Patient presents with   Follow-up    Follow Up    Subjective Kurt Mcdonald is a 58 y.o. male who presents for hypertension follow up. He does not monitor home blood pressures. He is compliant with medication- olmesartan 20 mg/d. Patient has these side effects of medication: none He is adhering to a healthy diet overall. Current exercise: walking, strength training No CP or SOB.   GAD Taking Klonopin  1 mg bid prn.  He tried taking propranolol  as needed but it did not work.  He is not seeing a therapist/counselor.  No homicidal or suicidal ideation.  No self-medication.  The Klonopin  works well for him.  No adverse effects.   Past Medical History:  Diagnosis Date   Anxiety    Chicken pox    Chronic pain syndrome    Depression    Diverticulitis of colon without hemorrhage 06/04/2013   Diverticulosis of colon    Headache    History of pneumonia    Hypercholesteremia    Hypertension    Irritable bowel syndrome (IBS)    LBP (low back pain)    Male hypogonadism    NAFLD (nonalcoholic fatty liver disease)    Overweight(278.02)    Vision abnormalities     Exam BP 138/84 (BP Location: Left Arm, Patient Position: Sitting)   Pulse 60   Temp 98 F (36.7 C) (Oral)   Resp 16   Ht 6' 4 (1.93 m)   Wt 288 lb 6.4 oz (130.8 kg)   SpO2 98%   BMI 35.11 kg/m  General:  well developed, well nourished, in no apparent distress Heart: RRR, no bruits, no LE edema Lungs: clear to auscultation, no accessory muscle use Psych: well oriented with normal range of affect and appropriate judgment/insight  Essential hypertension  CKD stage 3a, GFR 45-59 ml/min (HCC) - Plan: Basic metabolic panel with GFR  GAD (generalized anxiety disorder)  Need for Tdap vaccination - Plan: Tdap vaccine greater than or equal to 7yo IM  High risk medication use - Plan: Drug Monitoring Panel [phone] , Urine, CANCELED: Drug Monitoring Panel [phone] , Urine  Pneumococcal vaccination given -  Plan: Pneumococcal conjugate vaccine 20-valent (Prevnar 20)  1/2.  Chronic, hopefully stable.  Continue olmesartan 20 mg daily.  Follow-up on labs.  Counseled on diet and exercise. 3.  Chronic, stable.  Stop propranolol .  Continue Klonopin  twice daily as needed.  UDS and CSC updated today.   4/5.  Tdap and PCV 20 updated today. F/u in 6 months for physical. The patient voiced understanding and agreement to the plan.  Mabel [place], DO 11/04/23  11:40 AM

## 2023-11-06 LAB — DRUG MONITORING PANEL 376104, URINE
Amphetamines: NEGATIVE ng/mL (ref <500)
Barbiturates: NEGATIVE ng/mL (ref <300)
Benzodiazepines: NEGATIVE ng/mL (ref <100)
Cocaine Metabolite: NEGATIVE ng/mL (ref <150)
Desmethyltramadol: NEGATIVE ng/mL (ref <100)
Opiates: NEGATIVE ng/mL (ref <100)
Oxycodone: NEGATIVE ng/mL (ref <100)
Tramadol: NEGATIVE ng/mL (ref <100)

## 2023-11-06 LAB — DM TEMPLATE

## 2023-11-26 ENCOUNTER — Encounter: Payer: Self-pay | Admitting: Family Medicine

## 2023-11-28 ENCOUNTER — Other Ambulatory Visit: Payer: Self-pay | Admitting: Family Medicine

## 2023-11-28 DIAGNOSIS — F411 Generalized anxiety disorder: Secondary | ICD-10-CM

## 2023-12-03 ENCOUNTER — Encounter: Payer: Self-pay | Admitting: Family Medicine

## 2023-12-03 ENCOUNTER — Ambulatory Visit: Admitting: Family Medicine

## 2023-12-03 VITALS — BP 136/82 | HR 88 | Temp 98.0°F | Resp 16 | Ht 76.0 in | Wt 275.2 lb

## 2023-12-03 DIAGNOSIS — L739 Follicular disorder, unspecified: Secondary | ICD-10-CM

## 2023-12-03 DIAGNOSIS — J309 Allergic rhinitis, unspecified: Secondary | ICD-10-CM

## 2023-12-03 MED ORDER — METHYLPREDNISOLONE ACETATE 80 MG/ML IJ SUSP
80.0000 mg | Freq: Once | INTRAMUSCULAR | Status: AC
  Administered 2023-12-03: 80 mg via INTRAMUSCULAR

## 2023-12-03 MED ORDER — BENZOYL PEROXIDE WASH 5 % EX LIQD: Freq: Two times a day (BID) | CUTANEOUS | 12 refills | Status: AC

## 2023-12-03 NOTE — Patient Instructions (Addendum)
 This rash is from shaving. Neosporin when the pustules come about. The rash can last for weeks-months. This is not a sexually transmitted infection. The wash is to prevent recurrence.   The skin lesion on your scrotum is a skin tag and not related to sexual contact.  Let us  know if you need anything.

## 2023-12-03 NOTE — Progress Notes (Signed)
 Chief Complaint  Patient presents with   Follow-up    Follow Up Rash and Running Nose   Nasal Congestion    Kurt Mcdonald here for URI complaints.  Duration: 1 day  Associated symptoms: sinus pressure, sneezing, rhinorrhea, itchy watery eyes, and diarrhea, abd cramping Denies: sinus congestion, ear pain, ear drainage, sore throat, wheezing, shortness of breath, myalgia, and fevers Treatment to date: Claritin D, Alka Seltzer, Vit C Sick contacts: No  Rash Pubic region, some pain sometimes, redness. Happens after shaving.  Otherwise no drainage, spreading redness, fevers, or itching.  He is sexually active and is concerned it could be related to that.  Past Medical History:  Diagnosis Date   Anxiety    Chicken pox    Chronic pain syndrome    Depression    Diverticulitis of colon without hemorrhage 06/04/2013   Diverticulosis of colon    Headache    History of pneumonia    Hypercholesteremia    Hypertension    Irritable bowel syndrome (IBS)    LBP (low back pain)    Male hypogonadism    NAFLD (nonalcoholic fatty liver disease)    Overweight(278.02)    Vision abnormalities     Objective BP 136/82 (BP Location: Left Arm, Patient Position: Sitting)   Pulse 88   Temp 98 F (36.7 C) (Oral)   Resp 16   Ht 6' 4 (1.93 m)   Wt 275 lb 3.2 oz (124.8 kg)   SpO2 95%   BMI 33.50 kg/m  General: Awake, alert, appears stated age HEENT: AT, [place], ears patent b/l and TM's neg, nares patent w/o discharge, pharynx pink and without exudates, MMM, no sinus TTP Neck: No masses or asymmetry Heart: RRR Lungs: CTAB, no accessory muscle use Skin: Very few pustules and an erythematous patch over the mons pubis region.  Some confluent macules of the same shade.  They do blanch.  There is no TTP, excessive warmth, or drainage noted. Psych: Age appropriate judgment and insight, normal mood and affect  Folliculitis - Plan: benzoyl peroxide 5 % external liquid  Allergic rhinitis, unspecified  seasonality, unspecified trigger - Plan: methylPREDNISolone  acetate (DEPO-MEDROL ) injection 80 mg  I do not think he has an active infection but will send in some benzyl peroxide to help prevent future recurrences.  We does have pustule formation, recommended topical antibiotic ointment.  This is not an STI.  I think he may also have some postinflammatory hyperpigmentation. Depo-Medrol  injection today. Pt voiced understanding and agreement to the plan.  [name], DO 12/03/23 11:59 AM

## 2023-12-04 ENCOUNTER — Ambulatory Visit: Admitting: Family Medicine

## 2023-12-27 ENCOUNTER — Other Ambulatory Visit: Payer: Self-pay | Admitting: Family Medicine

## 2023-12-27 DIAGNOSIS — N1831 Chronic kidney disease, stage 3a: Secondary | ICD-10-CM

## 2024-01-07 DIAGNOSIS — N1831 Chronic kidney disease, stage 3a: Secondary | ICD-10-CM

## 2024-01-14 IMAGING — CR DG CHEST 2V
2 series · 2 of 2 positions shown · non-contrast
Comparison: Radiograph 04/29/2020

CLINICAL DATA: Chest pain

EXAM:
CHEST - 2 VIEW

[chest pa]
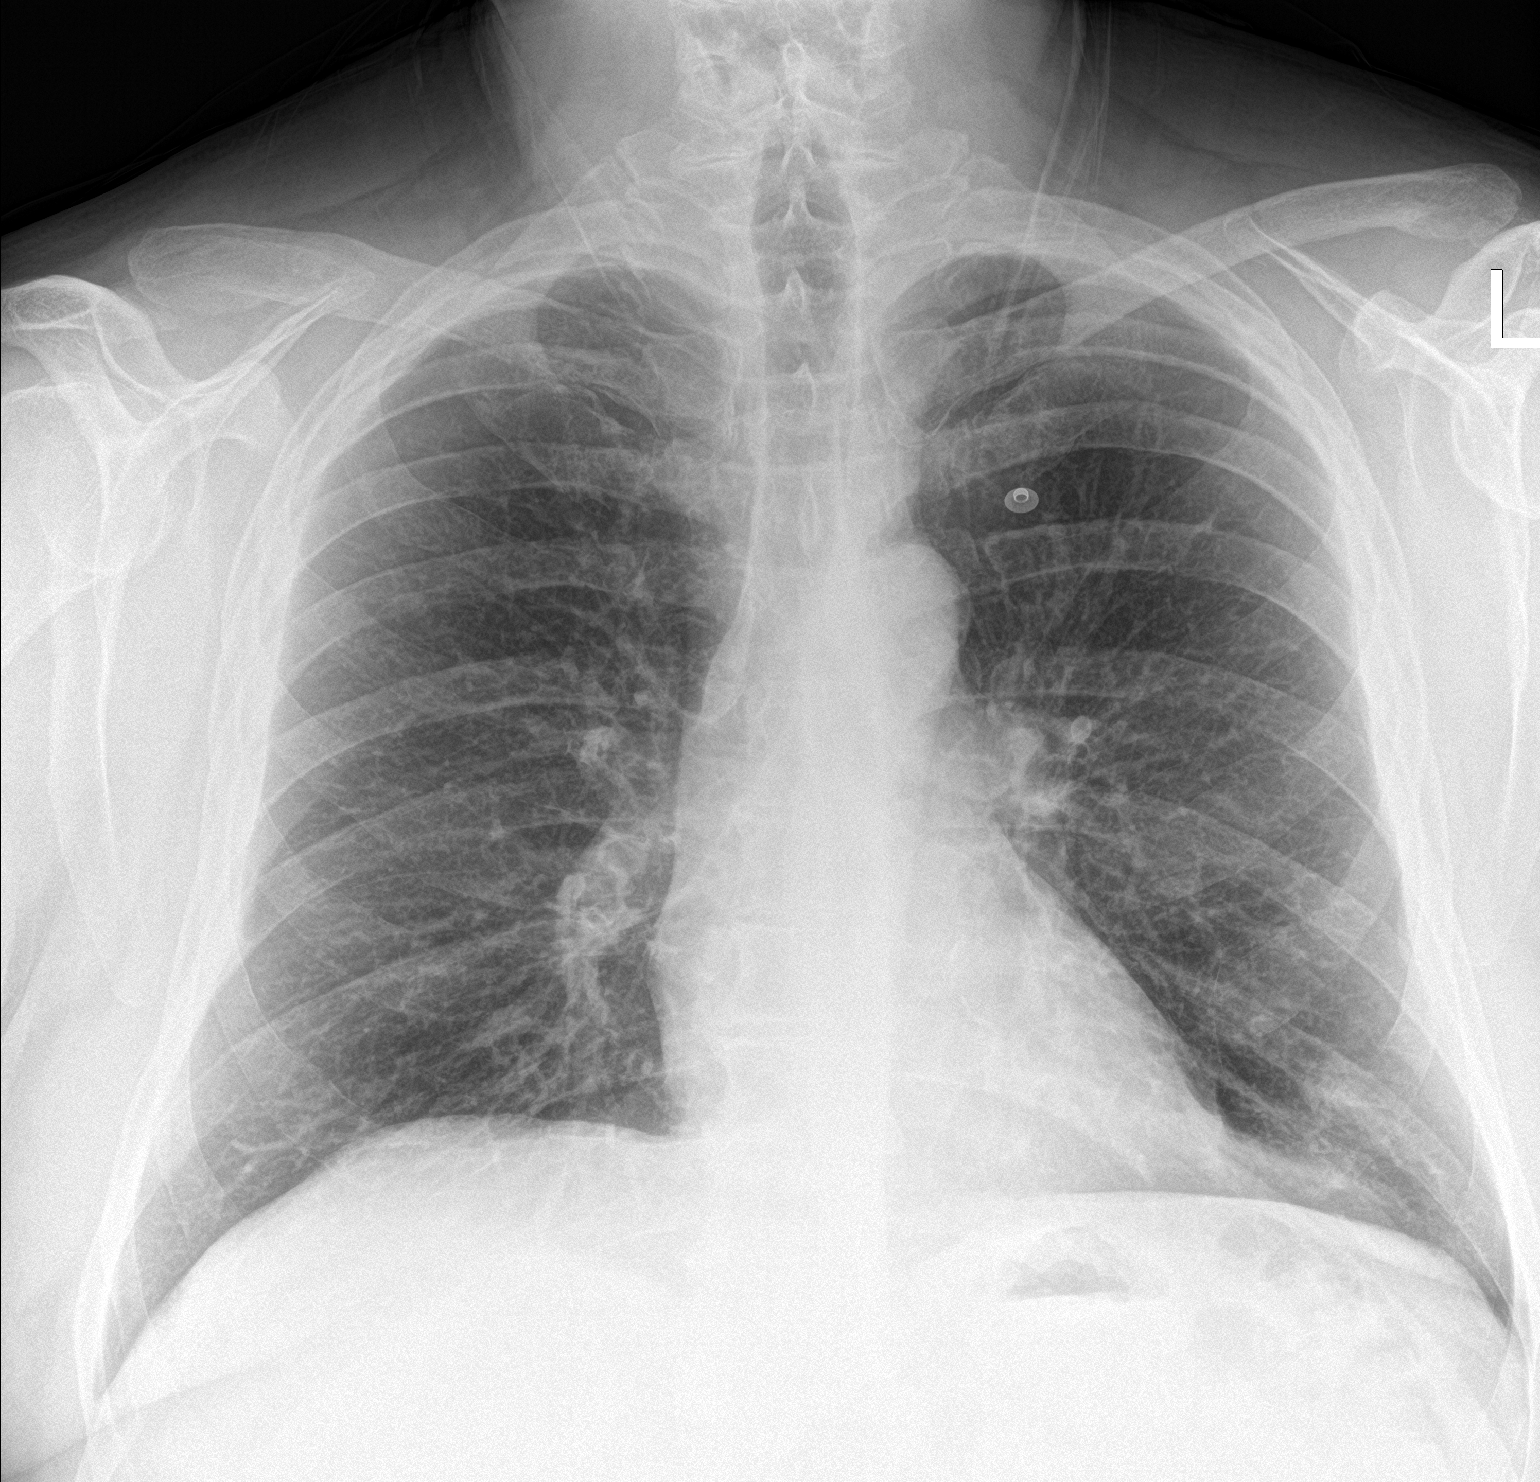

[chest lat]
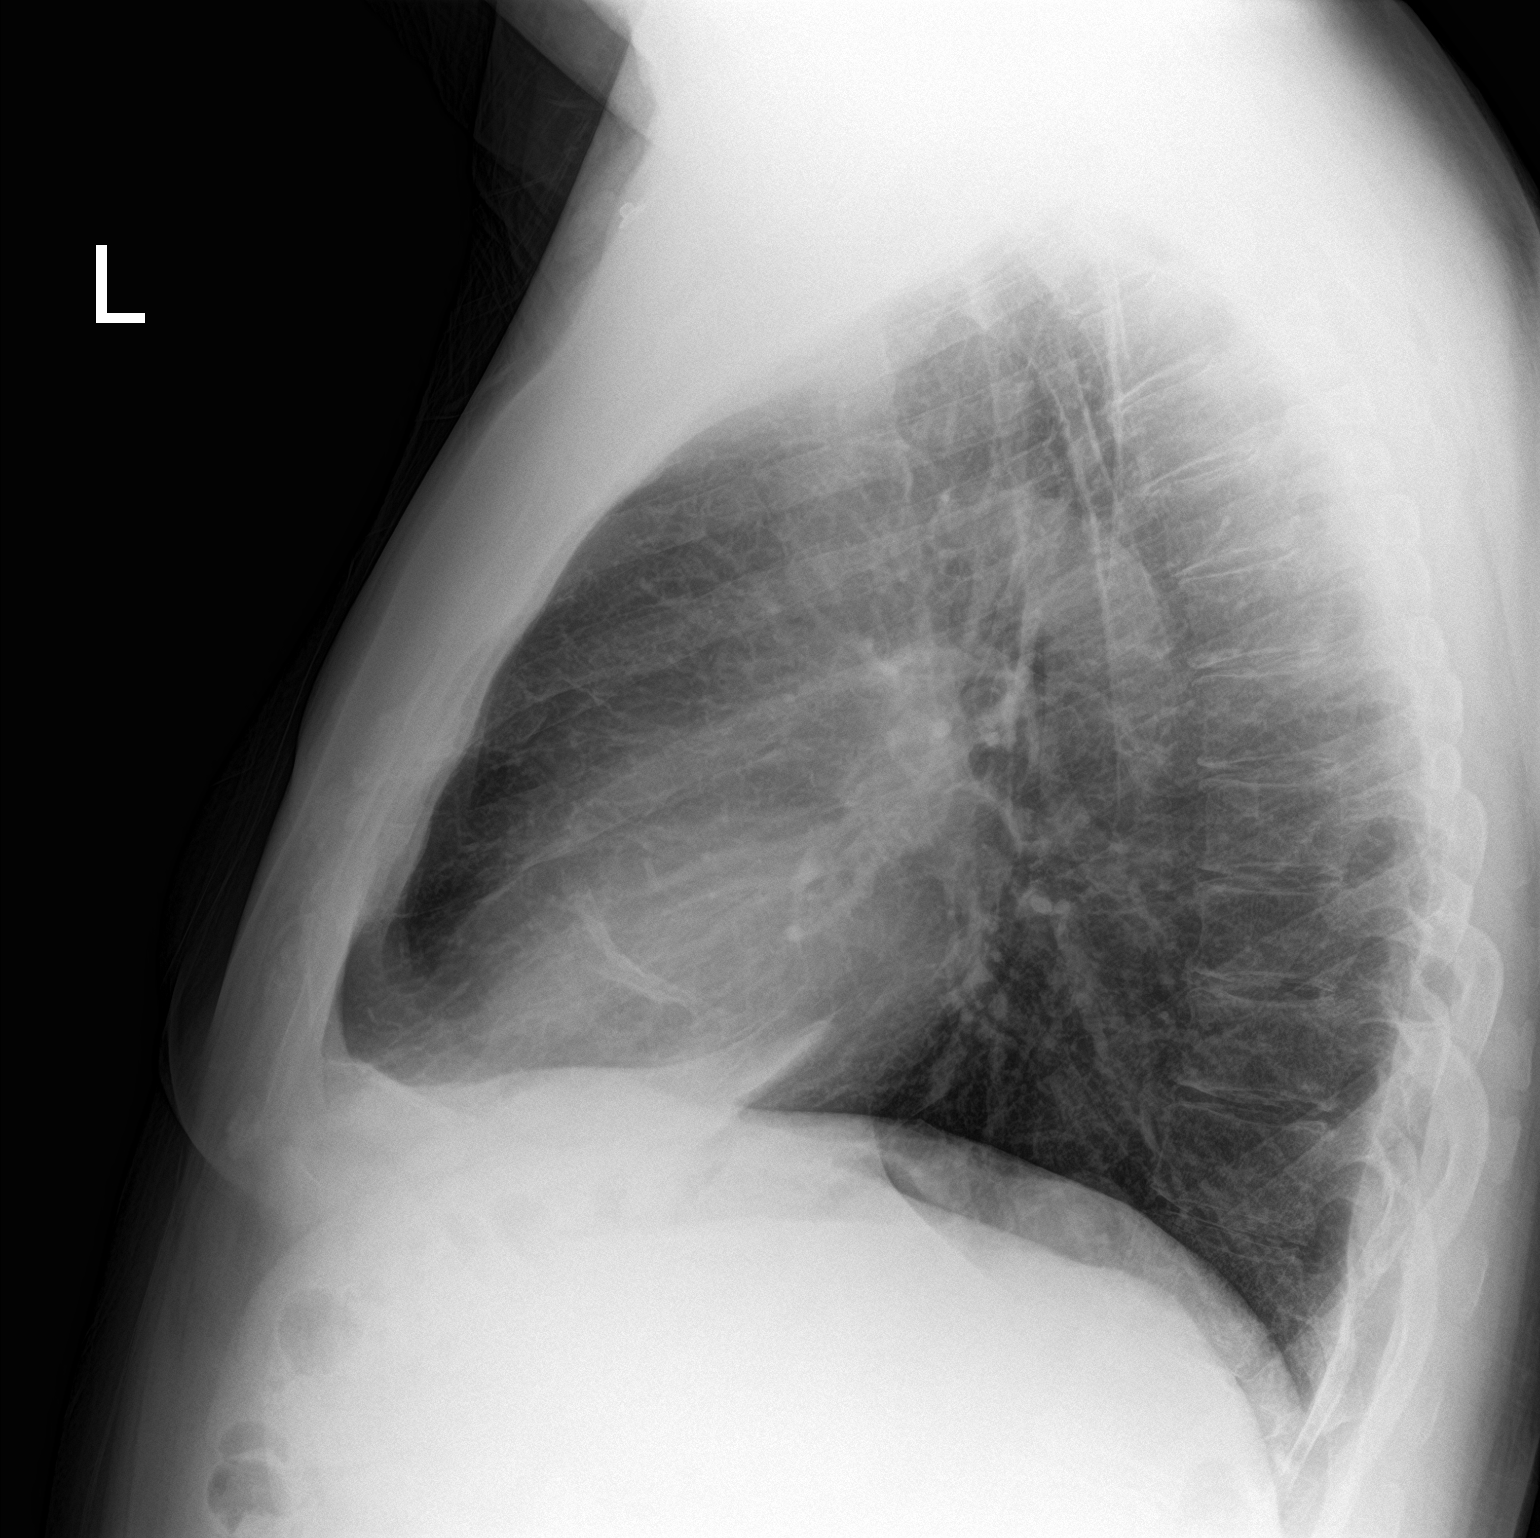

[2 of 2 positions shown; findings below may reference images not displayed]

FINDINGS: The heart size and mediastinal contours are within normal limits.No
focal airspace disease. No pleural effusion or pneumothorax.No acute
osseous abnormality. Thoracic spondylosis.
IMPRESSION: No evidence of acute cardiopulmonary disease.

## 2024-01-15 ENCOUNTER — Other Ambulatory Visit: Payer: Self-pay

## 2024-01-15 DIAGNOSIS — K219 Gastro-esophageal reflux disease without esophagitis: Secondary | ICD-10-CM

## 2024-01-15 MED ORDER — PANTOPRAZOLE SODIUM 40 MG PO TBEC: DELAYED_RELEASE_TABLET | ORAL | 1 refills | Status: AC

## 2024-04-14 ENCOUNTER — Ambulatory Visit: Admitting: Urology

## 2024-05-05 ENCOUNTER — Encounter: Admitting: Family Medicine
